# Patient Record
Sex: Female | Born: 1970 | Hispanic: Yes | Marital: Married | Smoking: Never smoker
Health system: Southern US, Community
[De-identification: ages and names within clinical notes are randomized; demographics above are authoritative.]

## PROBLEM LIST (undated history)

## (undated) DIAGNOSIS — E119 Type 2 diabetes mellitus without complications: Secondary | ICD-10-CM

## (undated) HISTORY — PX: PANCREAS SURGERY: SHX731

## (undated) HISTORY — PX: APPENDECTOMY: SHX54

## (undated) HISTORY — PX: CHOLECYSTECTOMY: SHX55

## (undated) HISTORY — DX: Type 2 diabetes mellitus without complications: E11.9

---

## 2013-02-11 ENCOUNTER — Encounter: Payer: Self-pay | Admitting: Endocrinology

## 2013-02-11 ENCOUNTER — Ambulatory Visit (INDEPENDENT_AMBULATORY_CARE_PROVIDER_SITE_OTHER): Payer: BC Managed Care – PPO | Admitting: Endocrinology

## 2013-02-11 VITALS — BP 130/80 | HR 92 | Temp 98.2°F | Resp 10 | Ht 59.0 in | Wt 135.2 lb

## 2013-02-11 DIAGNOSIS — E559 Vitamin D deficiency, unspecified: Secondary | ICD-10-CM

## 2013-02-11 DIAGNOSIS — C254 Malignant neoplasm of endocrine pancreas: Secondary | ICD-10-CM

## 2013-02-11 DIAGNOSIS — E781 Pure hyperglyceridemia: Secondary | ICD-10-CM

## 2013-02-11 LAB — URINALYSIS, ROUTINE W REFLEX MICROSCOPIC
Hgb urine dipstick: NEGATIVE
Leukocytes, UA: NEGATIVE
Nitrite: NEGATIVE
Specific Gravity, Urine: 1.025 (ref 1.000–1.030)
Urine Glucose: >=1000
Urobilinogen, UA: 0.2 (ref 0.0–1.0)
pH: 6 (ref 5.0–8.0)

## 2013-02-11 LAB — COMPREHENSIVE METABOLIC PANEL
ALT: 15 U/L (ref 0–35)
AST: 17 U/L (ref 0–37)
Albumin: 4.3 g/dL (ref 3.5–5.2)
BUN: 14 mg/dL (ref 6–23)
Creatinine, Ser: 0.5 mg/dL (ref 0.4–1.2)
Total Bilirubin: 0.6 mg/dL (ref 0.3–1.2)

## 2013-02-11 MED ORDER — ALOGLIPTIN-PIOGLITAZONE 25-30 MG PO TABS: 25.0000 mg | ORAL_TABLET | Freq: Every day | ORAL | Status: DC

## 2013-02-11 MED ORDER — METFORMIN HCL ER 500 MG PO TB24: 500.0000 mg | ORAL_TABLET | Freq: Every day | ORAL | Status: DC

## 2013-02-11 NOTE — Patient Instructions (Signed)
Use [employer] meter  Please check blood sugars at least half the time about 2 hours after any meal and as directed on waking up. Please bring blood sugar monitor to each visit

## 2013-02-11 NOTE — Progress Notes (Signed)
Quick Note:  Please let patient know that the potassium is low normal, to take Klor-Con 10 mEq daily for 15 days; glucose was 217 , urinalysis okay ______

## 2013-02-11 NOTE — Progress Notes (Signed)
Patient ID: Anna Jennings, female   DOB: 20-Jun-1970, 42 y.o.   MRN: [id number]   Reason for Appointment : Consultation for Type 2 Diabetes  History of Present Illness          Diagnosis: Type 2 diabetes mellitus, date of diagnosis: 2007       Past history: She was initially diagnosed with a two-hour postprandial glucose of 218 in 08/2005 and baseline A1c of 5.7 Apparently she was not treated with oral hypoglycemic drugs initially and probably started on metformin in 2009. She did have diarrhea with the regular metformin but tolerated extended release preparation better with no side effects even on 2000 mg She thinks her blood sugars are fairly well controlled initially but a couple of years later with higher readings she was given Amaryl also. The dose of this was gradually increased Her A1c readings have been ranging from 6.5-7.2 in between 2011 and 2013 but no further followup done after 11/2011  Recent history: She has not been followed up by her endocrinologist for over a year. Also she ran out off her medications 3 months ago and her blood sugars have been progressively higher. These are higher after meals and she is starting to get symptomatic with fatigue and increased thirst. Took some Amaryl since last week blood sugars are not much better. She has not exercised as much. Recently with high readings she found that her glucose was higher after exercise She is now establishing as a new patient for further management       Oral hypoglycemic drugs the patient is taking are: Amaryl 2 mg twice a day, previously metformin ER 2 g      Side effects from medications have been: Diarrhea from regular metformin   Glucose monitoring:  done one time a day         Glucometer: One Touch.      Blood Glucose readings from recall  PREMEAL Breakfast Lunch Dinner Bedtime  Overall   Glucose range: 260  180    Median:        POST-MEAL PC Breakfast PC Lunch PC Dinner  Glucose range: ?   280-300  Median:       Hypoglycemia:      Glycemic control:  No results found for this basename: HGBA1C   No results found for this basename: GLUF,  MICROALBUR,  LDLCALC,  CREATININE    Self-care: The diet that the patient has been following is: tries to limit portions, usually has some carbohydrate at every meal     Meals: 2 meals per day.  no breakfast, am snack, sandwich at lunch;  Some rice at meals         Exercise: Walking, less now, has treadmill. Previously also running           Dietician visit: Most recent:2011?                 Compliance with the medical regimen:  Retinal exam: Most recent: 02/07/13 Weight history: Losing weight, 10 lbs over the last 2 months   Filed Weights   02/11/13 1427  Weight: 135 lb 3.2 oz (61.326 kg)      Medication List       This list is accurate as of: 02/11/13 11:59 PM.  Always use your most recent med list.               Alogliptin-Pioglitazone 25-30 MG Tabs  Commonly known as:  OSENI  Take 25 mg by mouth daily.  glimepiride 2 MG tablet  Commonly known as:  AMARYL  Take 2 mg by mouth 2 (two) times daily.     metFORMIN 500 MG 24 hr tablet  Commonly known as:  GLUCOPHAGE-XR  Take 1 tablet (500 mg total) by mouth daily with breakfast. Takes 2 tablets twice a day     Vitamin D (Ergocalciferol) 50000 UNITS Caps capsule  Commonly known as:  DRISDOL  Take 50,000 Units by mouth every 7 (seven) days.        Allergies:  Allergies  Allergen Reactions  . Vicodin [Hydrocodone-Acetaminophen] Nausea Only    Past Medical History  Diagnosis Date  . Diabetes mellitus without complication     Past Surgical History  Procedure Laterality Date  . Cholecystectomy    . Appendectomy    . Pancreas surgery      Tumor head of pancreas, incompletely resected    Family History  Problem Relation Age of Onset  . Hyperlipidemia Father   . Hypertension Father   . Coronary artery disease Father   . Diabetes Brother     Social History:  reports that  she has never smoked. She has never used smokeless tobacco. Her alcohol and drug histories are not on file.    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 Was treated with radiation and subsequently her tumor has been stable and asymptomatic. Biopsy showed Adenocarcinoma, Islet Cell type. Her notes indicate tumor was 6.9 cm on ultrasound in 2012; periodically has had MRI done with no growth Report of last scan is not available      Lipids: In 2013 her LDL was 82 with HDL 43 and triglycerides 290. Has not been on any medications      No unusual headaches.  Previously has had migraines                Skin: No rash or infections     Thyroid:  No  unusual fatigue or history of thyroid disease.     The blood pressure has been previously high, was given Cozaar but could not tolerate it the cause of body aches. She has currently not on any medications     No swelling of feet.     No shortness of breath on exertion.     Bowel habits: Normal.      No joint  pains.         No history of Numbness, tingling or burning in  feet      She has had severe vitamin D deficiency diagnosed in 2009 with a level of 5.5, is on treatment   LABS:  Office Visit on 02/11/2013  Component Date Value Range Status  . Sodium 02/11/2013 136  135 - 145 mEq/L Final  . Potassium 02/11/2013 3.5  3.5 - 5.1 mEq/L Final  . Chloride 02/11/2013 101  96 - 112 mEq/L Final  . CO2 02/11/2013 27  19 - 32 mEq/L Final  . Glucose, Bld 02/11/2013 217* 70 - 99 mg/dL Final  . BUN 16/11/9602 14  6 - 23 mg/dL Final  . Creatinine, Ser 02/11/2013 0.5  0.4 - 1.2 mg/dL Final  . Total Bilirubin 02/11/2013 0.6  0.3 - 1.2 mg/dL Final  . Alkaline Phosphatase 02/11/2013 82  39 - 117 U/L Final  . AST 02/11/2013 17  0 - 37 U/L Final  . ALT  02/11/2013 15  0 - 35 U/L Final  . Total Protein 02/11/2013 7.3  6.0 - 8.3  g/dL Final  . Albumin 16/11/9602 4.3  3.5 - 5.2 g/dL Final  . Calcium 54/10/8117 8.8  8.4 - 10.5 mg/dL Final  . GFR 14/78/2956 141.21  >60.00 mL/min Final  . Color, Urine 02/11/2013 Yellow  Yellow;Lt. Yellow Final  . APPearance 02/11/2013 CLEAR  Clear Final  . Specific Gravity, Urine 02/11/2013 1.025  1.000-1.030 Final  . pH 02/11/2013 6.0  5.0 - 8.0 Final  . Total Protein, Urine 02/11/2013 NEGATIVE  Negative Final  . Urine Glucose 02/11/2013 >=1000  Negative Final  . Ketones, ur 02/11/2013 TRACE  Negative Final  . Bilirubin Urine 02/11/2013 NEGATIVE  Negative Final  . Hgb urine dipstick 02/11/2013 NEGATIVE  Negative Final  . Urobilinogen, UA 02/11/2013 0.2  0.0 - 1.0 Final  . Leukocytes, UA 02/11/2013 NEGATIVE  Negative Final  . Nitrite 02/11/2013 NEGATIVE  Negative Final  . WBC, UA 02/11/2013 3-6/hpf  0-2/hpf Final  . RBC / HPF 02/11/2013 0-2/hpf  0-2/hpf Final  . Squamous Epithelial / LPF 02/11/2013 Rare(0-4/hpf)  Rare(0-4/hpf) Final  . Bacteria, UA 02/11/2013 Rare(<10/hpf)  None Final    Physical Examination:  BP 130/80  Pulse 92  Temp(Src) 98.2 F (36.8 C)  Resp 10  Ht 4\' 11"  (1.499 m)  Wt 135 lb 3.2 oz (61.326 kg)  BMI 27.29 kg/m2  SpO2 97%  LMP 01/31/2013  GENERAL:         Patient has minimal obesity.   HEENT:         Eye exam shows normal external appearance. Fundus exam deferred, has had recent eye exam.  Oral exam shows normal mucosa .  NECK:         General:  Neck exam shows no lymphadenopathy. Carotids are normal to palpation and no bruit heard.  Thyroid is not enlarged and no nodules felt.   LUNGS:         Chest is symmetrical. Lungs are clear to auscultation.Marland Kitchen   HEART:         Heart sounds:  S1 and S2 are normal. No murmurs or clicks heard., no S3 or S4.   ABDOMEN:  large upper abdominal scar present. There is no distention present. Liver and spleen are not palpable. No other mass or tenderness present.  EXTREMITIES:     There is no edema. No skin lesions  present.Marland Kitchen  NEUROLOGICAL:        Vibration sense is nearly normal in toes. Ankle and biceps reflexes are absent bilaterally.          Diabetic foot exam:  as in the foot exam section MUSCULOSKELETAL:       There is no enlargement or deformity of the joints. SKIN:       No rash or lesions of concern.        ASSESSMENT:  Diabetes type 2, uncontrolled     She has had long-standing diabetes which has been previously mild. 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  History of hypertriglyceridemia: This will need to be followed up once her blood sugars are well controlled. May benefit from pioglitazone  Vitamin D deficiency: Previously severe, has been taking up to thousand units weekly, no recent vitamin D levels available on her record  PLAN:   For simplicity will start her Oseni tablets 25/30 which would help with insulin resistance as well as GLP-1 action and beta cell function. She will restart her metformin ER which had helped her previously and she had tolerated 2000 mg a day Advised her not to do any vigorous exercise until her blood sugars are well controlled but may start walking on her treadmill She will be scheduled to see a nutritionist for meal planning Discussed adding protein to each meal on a regular basis  She will need to be seen by gastroenterologist for periodic followup of her long-standing islet cell adenocarcinoma  Vitamin D deficiency: She will continue her  supplementation and have her level checked periodically   Bayan Hedstrom 02/12/2013, 3:06 PM

## 2013-02-12 ENCOUNTER — Other Ambulatory Visit: Payer: Self-pay | Admitting: *Deleted

## 2013-02-12 ENCOUNTER — Encounter: Payer: Self-pay | Admitting: Endocrinology

## 2013-02-12 DIAGNOSIS — E781 Pure hyperglyceridemia: Secondary | ICD-10-CM | POA: Insufficient documentation

## 2013-02-12 DIAGNOSIS — C254 Malignant neoplasm of endocrine pancreas: Secondary | ICD-10-CM | POA: Insufficient documentation

## 2013-02-12 DIAGNOSIS — E559 Vitamin D deficiency, unspecified: Secondary | ICD-10-CM | POA: Insufficient documentation

## 2013-02-12 MED ORDER — POTASSIUM CHLORIDE ER 10 MEQ PO TBCR: EXTENDED_RELEASE_TABLET | ORAL | Status: DC

## 2013-03-11 ENCOUNTER — Ambulatory Visit (INDEPENDENT_AMBULATORY_CARE_PROVIDER_SITE_OTHER): Payer: BC Managed Care – PPO | Admitting: Endocrinology

## 2013-03-11 ENCOUNTER — Other Ambulatory Visit (INDEPENDENT_AMBULATORY_CARE_PROVIDER_SITE_OTHER): Payer: BC Managed Care – PPO

## 2013-03-11 ENCOUNTER — Other Ambulatory Visit: Payer: BC Managed Care – PPO

## 2013-03-11 ENCOUNTER — Encounter: Payer: Self-pay | Admitting: Endocrinology

## 2013-03-11 VITALS — BP 118/80 | HR 101 | Temp 98.2°F | Resp 12 | Ht 59.0 in | Wt 140.9 lb

## 2013-03-11 DIAGNOSIS — E1165 Type 2 diabetes mellitus with hyperglycemia: Principal | ICD-10-CM

## 2013-03-11 DIAGNOSIS — IMO0001 Reserved for inherently not codable concepts without codable children: Secondary | ICD-10-CM

## 2013-03-11 DIAGNOSIS — E781 Pure hyperglyceridemia: Secondary | ICD-10-CM

## 2013-03-11 DIAGNOSIS — E559 Vitamin D deficiency, unspecified: Secondary | ICD-10-CM

## 2013-03-11 LAB — BASIC METABOLIC PANEL
BUN: 12 mg/dL (ref 6–23)
CHLORIDE: 102 meq/L (ref 96–112)
CO2: 25 mEq/L (ref 19–32)
Calcium: 8.5 mg/dL (ref 8.4–10.5)
Creatinine, Ser: 0.6 mg/dL (ref 0.4–1.2)
GFR: 124.15 mL/min (ref >60.00)
Glucose, Bld: 283 mg/dL — ABNORMAL HIGH (ref 70–99)
POTASSIUM: 3.9 meq/L (ref 3.5–5.1)
Sodium: 136 mEq/L (ref 135–145)

## 2013-03-11 NOTE — Patient Instructions (Addendum)
Please check blood sugars at least half the time about 2 hours after any meal (dinner especially) and as directed on waking up. Please bring blood sugar monitor to each visit  Amaryl before supper if sugars > 150; call back this week  VITAMIN D3, 5000 UNITS DAILY

## 2013-03-11 NOTE — Progress Notes (Signed)
Patient ID: Anna Jennings, female   DOB: 04/25/1970, 43 y.o.   MRN: [id number]   Reason for Appointment : Followup for Type 2 Diabetes  History of Present Illness          Diagnosis: Type 2 diabetes mellitus, date of diagnosis: 2007       Past history: She was initially diagnosed with a two-hour postprandial glucose of 218 in 08/2005 and baseline A1c of 5.7 Apparently she was not treated with oral hypoglycemic drugs initially and probably started on metformin in 2009. She did have diarrhea with the regular metformin but tolerated extended release preparation better with no side effects even on 2000 mg She thinks her blood sugars are fairly well controlled initially but a couple of years later with higher readings she was given Amaryl also. The dose of this was gradually increased Her A1c readings have been ranging from 6.5-7.2 in between 2011 and 2013 but no further followup done after 11/2011  Recent history: She had been out of her medications prior to her initial visit here in 12/14 and her blood sugars had gone progressively higher. His were not better with trying Amaryl for a few days She was empirically given a trial of metformin ER with increasing doses and also started  Oseni However she has not checked her blood sugars much especially in the last 12 days and not clear if they are better. The blood sugars were still over 200 last month in the mornings and today with not eating much blood sugar is down to 128 She thinks she feels a little better overall       Oral hypoglycemic drugs the patient is taking are: Oseni 15/30,  metformin ER 2 g      Side effects from medications have been: Diarrhea from regular metformin   Glucose monitoring:  done one time a day         Glucometer: One Touch.      Blood Glucose readings from download:  PREMEAL Breakfast Lunch Dinner   Overall   Glucose range:  212=269 161-188  128,199    Median:     205   POST-MEAL PC Breakfast PC Lunch PC Dinner   Glucose range: 213  316  Median:      Hypoglycemia: none     Glycemic control:  No results found for this basename: HGBA1C   No results found for this basename: GLUF,  MICROALBUR,  LDLCALC,  CREATININE    Self-care: The diet that the patient has been following is: tries to limit portions, usually has some carbohydrate at every meal     Meals: 2 meals per day.  no breakfast, am snack, sandwich at lunch;  Some rice at meals         Exercise: Walking 26min, 3/7; has treadmill. Previously also running           Dietician visit: Most recent:2011?                 Compliance with the medical regimen:  Retinal exam: Most recent: 02/07/13 Weight history: Losing weight, 10 lbs over the last 2 months   Filed Weights   03/11/13 1502  Weight: 140 lb 14.4 oz (63.912 kg)      Medication List       This list is accurate as of: 03/11/13  3:20 PM.  Always use your most recent med list.               Alogliptin-Pioglitazone 25-30 MG Tabs  Commonly known as:  OSENI  Take 25 mg by mouth daily.     metFORMIN 500 MG 24 hr tablet  Commonly known as:  GLUCOPHAGE-XR  Take 500 mg by mouth 2 (two) times daily. Takes 2 tablets twice a day     potassium chloride 10 MEQ tablet  Commonly known as:  K-DUR  Take 1 tablet daily for 15 days     Vitamin D (Ergocalciferol) 50000 UNITS Caps capsule  Commonly known as:  DRISDOL  Take 50,000 Units by mouth every 7 (seven) days.        Allergies:  Allergies  Allergen Reactions  . Vicodin [Hydrocodone-Acetaminophen] Nausea Only    Past Medical History  Diagnosis Date  . Diabetes mellitus without complication     Past Surgical History  Procedure Laterality Date  . Cholecystectomy    . Appendectomy    . Pancreas surgery      Tumor head of pancreas, incompletely resected    Family History  Problem Relation Age of Onset  . Hyperlipidemia Father   . Hypertension Father   . Coronary artery disease Father   . Diabetes Brother      Social History:  reports that she has never smoked. She has never used smokeless tobacco. Her alcohol and drug histories are not on file.    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 Was treated with radiation and subsequently her tumor has been stable and asymptomatic. Biopsy showed Adenocarcinoma, Islet Cell type. Her notes indicate tumor was 6.9 cm on ultrasound in 2012; periodically has had MRI done with no growth Report of last scan is not available      Lipids: In 2013 her LDL was 82 with HDL 43 and triglycerides 290. Has not been on any medications     No swelling of feet, was having swelling in [place] recently    She has had severe vitamin D deficiency diagnosed in 2009 with a level of 5.5, has not restarted supplements recently   LABS:  No visits with results within 1 Week(s) from this visit. Latest known visit with results is:  Office Visit on 02/11/2013  Component Date Value Range Status  . Sodium 02/11/2013 136  135 - 145 mEq/L Final  . Potassium 02/11/2013 3.5  3.5 - 5.1 mEq/L Final  . Chloride 02/11/2013 101  96 - 112 mEq/L Final  . CO2 02/11/2013 27  19 - 32 mEq/L Final  . Glucose, Bld 02/11/2013 217* 70 - 99 mg/dL Final  . BUN 02/11/2013 14  6 - 23 mg/dL Final  . Creatinine, Ser 02/11/2013 0.5  0.4 - 1.2 mg/dL Final  . Total Bilirubin 02/11/2013 0.6  0.3 - 1.2 mg/dL Final  . Alkaline Phosphatase 02/11/2013 82  39 - 117 U/L Final  . AST 02/11/2013 17  0 - 37 U/L Final  . ALT 02/11/2013 15  0 - 35 U/L Final  . Total Protein 02/11/2013 7.3  6.0 - 8.3 g/dL Final  . Albumin 02/11/2013 4.3  3.5 - 5.2 g/dL Final  . Calcium 02/11/2013 8.8  8.4 - 10.5 mg/dL Final  . GFR 02/11/2013 141.21  >60.00 mL/min Final  . Color, Urine 02/11/2013 Yellow  Yellow;Lt. Yellow Final  . APPearance 02/11/2013 CLEAR  Clear Final  . Specific Gravity, Urine  02/11/2013 1.025  1.000-1.030 Final  . pH 02/11/2013 6.0  5.0 -  8.0 Final  . Total Protein, Urine 02/11/2013 NEGATIVE  Negative Final  . Urine Glucose 02/11/2013 >=1000  Negative Final  . Ketones, ur 02/11/2013 TRACE  Negative Final  . Bilirubin Urine 02/11/2013 NEGATIVE  Negative Final  . Hgb urine dipstick 02/11/2013 NEGATIVE  Negative Final  . Urobilinogen, UA 02/11/2013 0.2  0.0 - 1.0 Final  . Leukocytes, UA 02/11/2013 NEGATIVE  Negative Final  . Nitrite 02/11/2013 NEGATIVE  Negative Final  . WBC, UA 02/11/2013 3-6/hpf  0-2/hpf Final  . RBC / HPF 02/11/2013 0-2/hpf  0-2/hpf Final  . Squamous Epithelial / LPF 02/11/2013 Rare(0-4/hpf)  Rare(0-4/hpf) Final  . Bacteria, UA 02/11/2013 Rare(<10/hpf)  None Final    Physical Examination:  BP 118/80  Pulse 101  Temp(Src) 98.2 F (36.8 C)  Resp 12  Ht 4\' 11"  (1.499 m)  Wt 140 lb 14.4 oz (63.912 kg)  BMI 28.44 kg/m2  SpO2 99%  LMP 01/31/2013      ASSESSMENT:  Diabetes type 2, uncontrolled     She has had long-standing diabetes which has been previously mild. Most likely she has had some progression of her beta cell dysfunction and will need a multidrug regimen to help with insulin resistance and beta cell function Although her blood sugar is fairly good today it is not clear if they are improving with regimen of metformin ER and Oseni which she started last month  Vitamin D deficiency: Previously severe, has been taking up to thousand units weekly, no recent vitamin D levels available on her record  PLAN:   She will check her blood sugars in the mornings regularly and call readings at the end of the week Most likely will need to take 2-4 mg of Amaryl in the evening either before or after supper based on when her blood sugars are going up Also will need to consider starting insulin if blood sugars do not respond  She will need to be seen by gastroenterologist for periodic followup of her long-standing islet cell  adenocarcinoma  Vitamin D deficiency: She will resume her supplementation, she prefers OTC and will let her start on 5000 units a day   Batina Dougan 03/11/2013, 3:20 PM   Appointment on 03/11/2013  Component Date Value Range Status  . Sodium 03/11/2013 136  135 - 145 mEq/L Final  . Potassium 03/11/2013 3.9  3.5 - 5.1 mEq/L Final  . Chloride 03/11/2013 102  96 - 112 mEq/L Final  . CO2 03/11/2013 25  19 - 32 mEq/L Final  . Glucose, Bld 03/11/2013 283* 70 - 99 mg/dL Final  . BUN 03/11/2013 12  6 - 23 mg/dL Final  . Creatinine, Ser 03/11/2013 0.6  0.4 - 1.2 mg/dL Final  . Calcium 03/11/2013 8.5  8.4 - 10.5 mg/dL Final  . GFR 03/11/2013 124.15  >60.00 mL/min Final  . Vit D, 25-Hydroxy 03/11/2013 27* 30 - 89 ng/mL Final   Comment: This assay accurately quantifies Vitamin D, which is the sum of the                          25-Hydroxy forms of Vitamin D2 and D3.  Studies have shown that the                          optimum concentration of 25-Hydroxy Vitamin D is 30 ng/mL or higher.  Concentrations of Vitamin D between 20 and 29 ng/mL are considered to                          be insufficient and concentrations less than 20 ng/mL are considered                          to be deficient for Vitamin D.

## 2013-03-12 LAB — VITAMIN D 25 HYDROXY (VIT D DEFICIENCY, FRACTURES): Vit D, 25-Hydroxy: 27 ng/mL — ABNORMAL LOW (ref 30–89)

## 2013-04-08 ENCOUNTER — Other Ambulatory Visit: Payer: BC Managed Care – PPO

## 2013-04-15 ENCOUNTER — Ambulatory Visit: Payer: BC Managed Care – PPO | Admitting: Endocrinology

## 2013-04-15 ENCOUNTER — Other Ambulatory Visit: Payer: BC Managed Care – PPO

## 2013-04-29 ENCOUNTER — Other Ambulatory Visit (INDEPENDENT_AMBULATORY_CARE_PROVIDER_SITE_OTHER): Payer: Self-pay

## 2013-04-29 DIAGNOSIS — IMO0001 Reserved for inherently not codable concepts without codable children: Secondary | ICD-10-CM

## 2013-04-29 DIAGNOSIS — E1165 Type 2 diabetes mellitus with hyperglycemia: Principal | ICD-10-CM

## 2013-04-29 LAB — COMPREHENSIVE METABOLIC PANEL
ALT: 20 U/L (ref 0–35)
AST: 15 U/L (ref 0–37)
Albumin: 4 g/dL (ref 3.5–5.2)
Alkaline Phosphatase: 67 U/L (ref 39–117)
BILIRUBIN TOTAL: 0.5 mg/dL (ref 0.3–1.2)
BUN: 13 mg/dL (ref 6–23)
CALCIUM: 8.8 mg/dL (ref 8.4–10.5)
CHLORIDE: 98 meq/L (ref 96–112)
CO2: 25 mEq/L (ref 19–32)
CREATININE: 0.7 mg/dL (ref 0.4–1.2)
GFR: 106.62 mL/min (ref >60.00)
GLUCOSE: 258 mg/dL — AB (ref 70–99)
Potassium: 4.1 mEq/L (ref 3.5–5.1)
SODIUM: 132 meq/L — AB (ref 135–145)
TOTAL PROTEIN: 7.4 g/dL (ref 6.0–8.3)

## 2013-04-29 LAB — LIPID PANEL
CHOL/HDL RATIO: 4
Cholesterol: 200 mg/dL (ref 0–200)
HDL: 51.1 mg/dL (ref >39.00)
LDL Cholesterol: 124 mg/dL — ABNORMAL HIGH (ref 0–99)
TRIGLYCERIDES: 124 mg/dL (ref 0.0–149.0)
VLDL: 24.8 mg/dL (ref 0.0–40.0)

## 2013-04-29 LAB — HEMOGLOBIN A1C: HEMOGLOBIN A1C: 8.5 % — AB (ref 4.6–6.5)

## 2013-05-06 ENCOUNTER — Encounter: Payer: Self-pay | Admitting: Endocrinology

## 2013-05-06 ENCOUNTER — Ambulatory Visit (INDEPENDENT_AMBULATORY_CARE_PROVIDER_SITE_OTHER): Payer: BC Managed Care – PPO | Admitting: Endocrinology

## 2013-05-06 VITALS — BP 116/78 | HR 86 | Temp 97.7°F | Resp 14 | Ht 59.0 in | Wt 140.4 lb

## 2013-05-06 DIAGNOSIS — E1165 Type 2 diabetes mellitus with hyperglycemia: Principal | ICD-10-CM

## 2013-05-06 DIAGNOSIS — IMO0001 Reserved for inherently not codable concepts without codable children: Secondary | ICD-10-CM

## 2013-05-06 DIAGNOSIS — E782 Mixed hyperlipidemia: Secondary | ICD-10-CM

## 2013-05-06 NOTE — Progress Notes (Signed)
Patient ID: Anna Jennings, female   DOB: 08-Oct-1970, 43 y.o.   MRN: [id number]   Reason for Appointment : Followup for Type 2 Diabetes  History of Present Illness          Diagnosis: Type 2 diabetes mellitus, date of diagnosis: 2007       Past history: She was initially diagnosed with a two-hour postprandial glucose of 218 in 08/2005 and baseline A1c of 5.7 Apparently she was not treated with oral hypoglycemic drugs initially and probably started on metformin in 2009. She did have diarrhea with the regular metformin but tolerated extended release preparation better with no side effects even on 2000 mg She thinks her blood sugars are fairly well controlled initially but a couple of years later with higher readings she was given Amaryl also. The dose of this was gradually increased Her A1c readings have been ranging from 6.5-7.2 in between 2011 and 2013 but no further followup done after 11/2011 She had been out of her medications prior to her initial visit here in 12/14 and her blood sugars were progressively higher. This was despite trying Amaryl on her own  Recent history:  She is on a trial of metformin ER with  Oseni Blood sugars apparently did not improve with this Also she thinks she is getting more bloated and getting some weight gain with her Oseni However she has not checked her blood sugars much especially in the last 2 weeks or so  Also has not been consistent with self care because of family issues       Oral hypoglycemic drugs the patient is taking are: Oseni 15/30,  metformin ER 2 g      Side effects from medications have been: Diarrhea from regular metformin   Glucose monitoring:  done one time a day         Glucometer: One Touch.      Blood Glucose readings not checked for the last 2 weeks, previously in am 192, 214 Hypoglycemia: none     Glycemic control:  Lab Results  Component Value Date   HGBA1C 8.5* 04/29/2013   No results found for this basename: GLUF,  MICROALBUR,   LDLCALC,  CREATININE    Self-care: The diet that the patient has been following is: tries to limit portions, usually has some carbohydrate at every meal     Meals: 2 meals per day.  no breakfast, am snack, sandwich at lunch;  Some rice at meals         Exercise: Walking 82min, 3/7; has treadmill. Previously also running           Dietician visit: Most recent:2011?                 Compliance with the medical regimen:  Retinal exam: Most recent: 02/07/13 Weight history:  Wt Readings from Last 3 Encounters:  05/06/13 140 lb 6.4 oz (63.685 kg)  03/11/13 140 lb 14.4 oz (63.912 kg)  02/11/13 135 lb 3.2 oz (61.326 kg)       Medication List       This list is accurate as of: 05/06/13 11:59 PM.  Always use your most recent med list.               Alogliptin-Pioglitazone 25-30 MG Tabs  Commonly known as:  OSENI  Take 25 mg by mouth daily.     metFORMIN 500 MG 24 hr tablet  Commonly known as:  GLUCOPHAGE-XR  Take 500 mg by mouth 2 (two)  times daily. Takes 2 tablets twice a day     potassium chloride 10 MEQ tablet  Commonly known as:  K-DUR  Take 1 tablet daily for 15 days     Vitamin D (Ergocalciferol) 50000 UNITS Caps capsule  Commonly known as:  DRISDOL  Take 50,000 Units by mouth every 7 (seven) days.        Allergies:  Allergies  Allergen Reactions  . Vicodin [Hydrocodone-Acetaminophen] Nausea Only    Past Medical History  Diagnosis Date  . Diabetes mellitus without complication     Past Surgical History  Procedure Laterality Date  . Cholecystectomy    . Appendectomy    . Pancreas surgery      Tumor head of pancreas, incompletely resected    Family History  Problem Relation Age of Onset  . Hyperlipidemia Father   . Hypertension Father   . Coronary artery disease Father   . Diabetes Brother     Social History:  reports that she has never smoked. She has never used smokeless tobacco. Her alcohol and drug histories are not on file.    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 Was treated with radiation and subsequently her tumor has been stable and asymptomatic. Biopsy showed Adenocarcinoma, Islet Cell type. Her notes indicate tumor was 6.9 cm on ultrasound in 2012; periodically has had MRI done with no growth Report of last scan is not available      Lipids: In 2013 her LDL was 82 with HDL 43 and triglycerides 290. Has not been on any medications    She has had severe vitamin D deficiency diagnosed in 2009 with a level of 5.5, has not restarted supplements recently  No history of hypertension   LABS:  No visits with results within 1 Week(s) from this visit. Latest known visit with results is:  Appointment on 04/29/2013  Component Date Value Ref Range Status  . Hemoglobin A1C 04/29/2013 8.5* 4.6 - 6.5 % Final   Glycemic Control Guidelines for People with Diabetes:Non Diabetic:  <6%Goal of Therapy: <7%Additional Action Suggested:  >8%   . Cholesterol 04/29/2013 200  0 - 200 mg/dL Final   ATP III Classification       Desirable:  < 200 mg/dL               Borderline High:  200 - 239 mg/dL          High:  > = 240 mg/dL  . Triglycerides 04/29/2013 124.0  0.0 - 149.0 mg/dL Final   Normal:  <150 mg/dLBorderline High:  150 - 199 mg/dL  . HDL 04/29/2013 51.10  >39.00 mg/dL Final  . VLDL 04/29/2013 24.8  0.0 - 40.0 mg/dL Final  . LDL Cholesterol 04/29/2013 124* 0 - 99 mg/dL Final  . Total CHOL/HDL Ratio 04/29/2013 4   Final                  Men          Women1/2 Average Risk     3.4          3.3Average Risk          5.0          4.42X Average Risk          9.6          7.13X Average Risk  15.0          11.0                      . Sodium 04/29/2013 132* 135 - 145 mEq/L Final  . Potassium 04/29/2013 4.1  3.5 - 5.1 mEq/L Final  . Chloride 04/29/2013 98  96 - 112 mEq/L Final  . CO2 04/29/2013 25  19 - 32  mEq/L Final  . Glucose, Bld 04/29/2013 258* 70 - 99 mg/dL Final  . BUN 04/29/2013 13  6 - 23 mg/dL Final  . Creatinine, Ser 04/29/2013 0.7  0.4 - 1.2 mg/dL Final  . Total Bilirubin 04/29/2013 0.5  0.3 - 1.2 mg/dL Final  . Alkaline Phosphatase 04/29/2013 67  39 - 117 U/L Final  . AST 04/29/2013 15  0 - 37 U/L Final  . ALT 04/29/2013 20  0 - 35 U/L Final  . Total Protein 04/29/2013 7.4  6.0 - 8.3 g/dL Final  . Albumin 04/29/2013 4.0  3.5 - 5.2 g/dL Final  . Calcium 04/29/2013 8.8  8.4 - 10.5 mg/dL Final  . GFR 04/29/2013 106.62  >60.00 mL/min Final    Physical Examination:  BP 116/78  Pulse 86  Temp(Src) 97.7 F (36.5 C)  Resp 14  Ht 4\' 11"  (1.499 m)  Wt 140 lb 6.4 oz (63.685 kg)  BMI 28.34 kg/m2  SpO2 99%  LMP 04/14/2013  Minimal puffiness of hands and face, no ankle edema    ASSESSMENT:  Diabetes type 2, uncontrolled     She has had long-standing diabetes which has been previously mild. Her blood sugars are significantly high and not improving with a 3 drug regimen Also appears to be getting some weight gain and mild generalized fluid retention with pioglitazone  Most likely has insulin deficiency and will need to add insulin; most of her high readings appear to be in the morning and will benefit from basal insulin to start with  Hypercholesterolemia: Lipids to be checked today  PLAN:   She will be instructed diagnosis educator on starting Levemir insulin 10 units a day and evenings and this will be titrated to get her morning sugar down to at least under 130 Discussed basic principles of insulin use and basal insulin Meanwhile she will switch from [place] to Glyxambi 10/5 mg daily in the morning with a 30 day sample  With this she will hopefully have better control and some weight loss as well as better postprandial control Discussed how SGLT2 drugs work and benefits as well as possible side effects   Jacobb Alen 05/08/2013, 4:00 PM   Addendum:She does have a high  LDL now and will discuss treatment on the next visit  No visits with results within 1 Week(s) from this visit. Latest known visit with results is:  Appointment on 04/29/2013  Component Date Value Ref Range Status  . Hemoglobin A1C 04/29/2013 8.5* 4.6 - 6.5 % Final   Glycemic Control Guidelines for People with Diabetes:Non Diabetic:  <6%Goal of Therapy: <7%Additional Action Suggested:  >8%   . Cholesterol 04/29/2013 200  0 - 200 mg/dL Final   ATP III Classification       Desirable:  < 200 mg/dL               Borderline High:  200 - 239 mg/dL          High:  > = 240 mg/dL  . Triglycerides 04/29/2013 124.0  0.0 - 149.0 mg/dL Final   Normal:  <  150 mg/dLBorderline High:  150 - 199 mg/dL  . HDL 04/29/2013 51.10  >39.00 mg/dL Final  . VLDL 04/29/2013 24.8  0.0 - 40.0 mg/dL Final  . LDL Cholesterol 04/29/2013 124* 0 - 99 mg/dL Final  . Total CHOL/HDL Ratio 04/29/2013 4   Final                  Men          Women1/2 Average Risk     3.4          3.3Average Risk          5.0          4.42X Average Risk          9.6          7.13X Average Risk          15.0          11.0                      . Sodium 04/29/2013 132* 135 - 145 mEq/L Final  . Potassium 04/29/2013 4.1  3.5 - 5.1 mEq/L Final  . Chloride 04/29/2013 98  96 - 112 mEq/L Final  . CO2 04/29/2013 25  19 - 32 mEq/L Final  . Glucose, Bld 04/29/2013 258* 70 - 99 mg/dL Final  . BUN 04/29/2013 13  6 - 23 mg/dL Final  . Creatinine, Ser 04/29/2013 0.7  0.4 - 1.2 mg/dL Final  . Total Bilirubin 04/29/2013 0.5  0.3 - 1.2 mg/dL Final  . Alkaline Phosphatase 04/29/2013 67  39 - 117 U/L Final  . AST 04/29/2013 15  0 - 37 U/L Final  . ALT 04/29/2013 20  0 - 35 U/L Final  . Total Protein 04/29/2013 7.4  6.0 - 8.3 g/dL Final  . Albumin 04/29/2013 4.0  3.5 - 5.2 g/dL Final  . Calcium 04/29/2013 8.8  8.4 - 10.5 mg/dL Final  . GFR 04/29/2013 106.62  >60.00 mL/min Final

## 2013-05-06 NOTE — Patient Instructions (Signed)
Will start Levemir 10 units at dinner after instructions  Please check blood sugars at least half the time about 2 hours after any meal and as directed on waking up. Please bring blood sugar monitor to each visit ] Stop Oseni and start Glyxambi

## 2013-05-08 DIAGNOSIS — E782 Mixed hyperlipidemia: Secondary | ICD-10-CM | POA: Insufficient documentation

## 2013-05-13 ENCOUNTER — Encounter: Payer: BC Managed Care – PPO | Attending: Endocrinology | Admitting: Nutrition

## 2013-05-13 DIAGNOSIS — E1165 Type 2 diabetes mellitus with hyperglycemia: Secondary | ICD-10-CM

## 2013-05-13 DIAGNOSIS — E119 Type 2 diabetes mellitus without complications: Secondary | ICD-10-CM | POA: Insufficient documentation

## 2013-05-13 DIAGNOSIS — Z713 Dietary counseling and surveillance: Secondary | ICD-10-CM | POA: Insufficient documentation

## 2013-05-13 DIAGNOSIS — IMO0001 Reserved for inherently not codable concepts without codable children: Secondary | ICD-10-CM

## 2013-05-13 NOTE — Progress Notes (Signed)
Patient was instructed on the use of the Levemir FlexTouch pen.  Written instructions were given for 10u at bedtime.  We discussed how to use the pen and she redemonstrated the procedure well and had no questions.   We also discussed how to adjust the Levemir dose, and written instructions were given for this as well.  She reported good understanding of this. We reviewed low blood sugars--symptoms and treatment.  She was given a BD pen starter kit and and a Levemir starter kit.   Her diet is not balanced and high in carbs for breakfast.  She is eating cereal, milk and 2-3 fruit servings.  Suggestions were given for a 30-45 gram carb breakfast with one ounce of protein.   We discussed her diabetes and the need for exercise, balanced meals and insulin.  She reported good understanding of this. She is walking on the treadmill at home for 20 min. Each day.  She was encouraged to increase this to 30 min. By doing a 10 min. Walk at lunch time.  She agreed to do this.   She had no final questions.

## 2013-05-14 NOTE — Patient Instructions (Signed)
Take 10u of Levemir at bedtime Increase the dose  By 2units every 3 days until FBSs are less than 120.  Test blood sugars before breakfast and 2hr. After one meal each day. Limit breakfast to no more than 3 servings of carbs, and one ounce of protein. Call if questions.

## 2013-05-27 ENCOUNTER — Ambulatory Visit: Payer: BC Managed Care – PPO | Admitting: Endocrinology

## 2013-06-03 ENCOUNTER — Ambulatory Visit (INDEPENDENT_AMBULATORY_CARE_PROVIDER_SITE_OTHER): Payer: BC Managed Care – PPO | Admitting: Endocrinology

## 2013-06-03 ENCOUNTER — Ambulatory Visit: Payer: BC Managed Care – PPO | Admitting: Endocrinology

## 2013-06-03 ENCOUNTER — Encounter: Payer: Self-pay | Admitting: Endocrinology

## 2013-06-03 VITALS — BP 108/72 | HR 95 | Temp 97.8°F | Resp 14 | Ht 59.0 in | Wt 135.4 lb

## 2013-06-03 DIAGNOSIS — E782 Mixed hyperlipidemia: Secondary | ICD-10-CM

## 2013-06-03 DIAGNOSIS — E1165 Type 2 diabetes mellitus with hyperglycemia: Principal | ICD-10-CM

## 2013-06-03 DIAGNOSIS — IMO0001 Reserved for inherently not codable concepts without codable children: Secondary | ICD-10-CM

## 2013-06-03 MED ORDER — EMPAGLIFLOZIN-LINAGLIPTIN 10-5 MG PO TABS: 10.0000 mg | ORAL_TABLET | Freq: Every day | ORAL | Status: DC

## 2013-06-03 NOTE — Progress Notes (Signed)
Patient ID: Anna Jennings, female   DOB: [birth year], 43 y.o.   MRN: [id number]   Reason for Appointment : Followup for Type 2 Diabetes  History of Present Illness          Diagnosis: Type 2 diabetes mellitus, date of diagnosis: 2007       Past history: She was initially diagnosed with a two-hour postprandial glucose of 218 in 08/2005 and baseline A1c of 5.7 Apparently she was not treated with oral hypoglycemic drugs initially and probably started on metformin in 2009. She did have diarrhea with the regular metformin but tolerated extended release preparation better with no side effects even on 2000 mg She thinks her blood sugars are fairly well controlled initially but a couple of years later with higher readings she was given Amaryl also. The dose of this was gradually increased Her A1c readings have been ranging from 6.5-7.2 in between 2011 and 2013 but no further followup done after 11/2011 She had been out of her medications prior to her initial visit here in 12/14 and her blood sugars were progressively higher. This was despite trying Amaryl on her own  Recent history:  She was started on Levemir insulin on 05/13/13. Initially was given 8 units She was instructed on titration and has gone up to 14 units about 3 days ago She was also having difficulties with bloating and swelling with pioglitazone and her [name] was stopped She has been taking a sample of Glyxambi 10/5 since her last visit The blood sugars are significantly better compared to readings of around 200 previously; has been monitoring much more regularly also She is also on maximum dose metformin ER 2000mg . No side effects from extended release metformin       Oral hypoglycemic drugs the patient is taking are: Glyxambi,  metformin ER 2 g      Side effects from medications have been: Diarrhea from regular metformin   Glucose monitoring:  2.5 times a day       Glucometer: One Touch.       PREMEAL Breakfast Lunch 7-9 pm Bedtime  Overall  Glucose range:  101-145   81-131   98-198    75-198   Mean/median:  126    138    122    Hypoglycemia: none     Glycemic control:  Lab Results  Component Value Date   HGBA1C 8.5* 04/29/2013   No results found for this basename: GLUF,  MICROALBUR,  LDLCALC,  CREATININE    Self-care: The diet that the patient has been following is: tries to limit portions, usually has some carbohydrate at every meal     Meals: 2 meals per day.  no breakfast, am snack, sandwich at lunch;  Some rice at meals         Exercise: Walking 42min, 5/7; has treadmill.            Dietician visit: Most recent:2011?                 Compliance with the medical regimen:  Retinal exam: Most recent: 02/07/13 Weight history:  Wt Readings from Last 3 Encounters:  06/03/13 135 lb 6.4 oz (61.417 kg)  05/06/13 140 lb 6.4 oz (63.685 kg)  03/11/13 140 lb 14.4 oz (63.912 kg)       Medication List       This list is accurate as of: 06/03/13  4:10 PM.  Always use your most recent med list.  Alogliptin-Pioglitazone 25-30 MG Tabs  Commonly known as:  OSENI  Take 25 mg by mouth daily.     GLYXAMBI 10-5 MG Tabs  Generic drug:  Empagliflozin-Linagliptin  Take by mouth.     LEVEMIR FLEXTOUCH Notus  Inject 14 Units into the skin.     metFORMIN 500 MG 24 hr tablet  Commonly known as:  GLUCOPHAGE-XR  Take 500 mg by mouth 2 (two) times daily. Takes 2 tablets twice a day     potassium chloride 10 MEQ tablet  Commonly known as:  K-DUR  Take 1 tablet daily for 15 days     Vitamin D (Ergocalciferol) 50000 UNITS Caps capsule  Commonly known as:  DRISDOL  Take 50,000 Units by mouth every 7 (seven) days.        Allergies:  Allergies  Allergen Reactions  . Vicodin [Hydrocodone-Acetaminophen] Nausea Only    Past Medical History  Diagnosis Date  . Diabetes mellitus without complication     Past Surgical History  Procedure Laterality Date  . Cholecystectomy    . Appendectomy    .  Pancreas surgery      Tumor head of pancreas, incompletely resected    Family History  Problem Relation Age of Onset  . Hyperlipidemia Father   . Hypertension Father   . Coronary artery disease Father   . Diabetes Brother     Social History:  reports that she has never smoked. She has never used smokeless tobacco. Her alcohol and drug histories are not on file.    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 Was treated with radiation and subsequently her tumor has been stable and asymptomatic. Biopsy showed Adenocarcinoma, Islet Cell type. Her notes indicate tumor was 6.9 cm on ultrasound in 2012; periodically has had MRI done with no growth Report of last scan is not available      Lipids: Currently untreated  Lab Results  Component Value Date   CHOL 200 04/29/2013   HDL 51.10 04/29/2013   LDLCALC 124* 04/29/2013   TRIG 124.0 04/29/2013   CHOLHDL 4 04/29/2013       She has had severe vitamin D deficiency diagnosed in 2009 with a level of 5.5, has not restarted supplements recently  No history of hypertension   LABS:  Pending  Physical Examination:  BP 108/72  Pulse 95  Temp(Src) 97.8 F (36.6 C)  Resp 14  Ht 4\' 11"  (1.499 m)  Wt 135 lb 6.4 oz (61.417 kg)  BMI 27.33 kg/m2  SpO2 99%  LMP 05/08/2013     ASSESSMENT:  Diabetes type 2, uncontrolled      Her blood sugars are significantly better since her last visit with starting basal insulin as well as Glyxambi She has done better with this regimen compared to [facility] and also is tolerating this well Her weight has improved with the change also She has been very comfortable in doing the insulin adjustment and recent fasting readings are good She is also done better with exercise regimen and home glucose monitoring May consider leaving off her [place]ad[place] component on the next  visit  Hypercholesterolemia:  We discussed treatment on the next visit  PLAN:   Continue same regimen  Also recommended her that she check on the followup for her pancreatic CT scan which needs to be done every 2 years   [facility] 06/03/2013,  4:10 PM

## 2013-06-04 ENCOUNTER — Telehealth: Payer: Self-pay | Admitting: Endocrinology

## 2013-06-04 ENCOUNTER — Other Ambulatory Visit: Payer: Self-pay | Admitting: *Deleted

## 2013-06-04 MED ORDER — INSULIN PEN NEEDLE 32G X 4 MM MISC: Status: DC

## 2013-06-04 MED ORDER — INSULIN DETEMIR 100 UNIT/ML FLEXPEN: 14.0000 [IU] | PEN_INJECTOR | Freq: Every day | SUBCUTANEOUS | Status: DC

## 2013-06-04 NOTE — Telephone Encounter (Signed)
Samples left °

## 2013-06-04 NOTE — Telephone Encounter (Signed)
rx sent

## 2013-06-04 NOTE — Telephone Encounter (Signed)
Please refill and sample requests to [facility]

## 2013-06-04 NOTE — Telephone Encounter (Signed)
Pt states she needs the levemir pens and needles  [facility] at [address]:)  Pt wants to know if we have any samples her insurance is transferring over

## 2013-08-19 ENCOUNTER — Other Ambulatory Visit (INDEPENDENT_AMBULATORY_CARE_PROVIDER_SITE_OTHER): Payer: BC Managed Care – PPO

## 2013-08-19 DIAGNOSIS — E1165 Type 2 diabetes mellitus with hyperglycemia: Principal | ICD-10-CM

## 2013-08-19 DIAGNOSIS — E782 Mixed hyperlipidemia: Secondary | ICD-10-CM

## 2013-08-19 DIAGNOSIS — IMO0001 Reserved for inherently not codable concepts without codable children: Secondary | ICD-10-CM

## 2013-08-19 LAB — BASIC METABOLIC PANEL
BUN: 22 mg/dL (ref 6–23)
CALCIUM: 8.9 mg/dL (ref 8.4–10.5)
CO2: 28 mEq/L (ref 19–32)
Chloride: 108 mEq/L (ref 96–112)
Creatinine, Ser: 0.6 mg/dL (ref 0.4–1.2)
GFR: 116.76 mL/min (ref >60.00)
Glucose, Bld: 112 mg/dL — ABNORMAL HIGH (ref 70–99)
POTASSIUM: 4.3 meq/L (ref 3.5–5.1)
SODIUM: 139 meq/L (ref 135–145)

## 2013-08-19 LAB — LDL CHOLESTEROL, DIRECT: LDL DIRECT: 134.4 mg/dL

## 2013-08-19 LAB — HEMOGLOBIN A1C: Hgb A1c MFr Bld: 6.1 % (ref 4.6–6.5)

## 2013-08-26 ENCOUNTER — Encounter: Payer: Self-pay | Admitting: Endocrinology

## 2013-08-26 ENCOUNTER — Ambulatory Visit (INDEPENDENT_AMBULATORY_CARE_PROVIDER_SITE_OTHER): Payer: BC Managed Care – PPO | Admitting: Endocrinology

## 2013-08-26 VITALS — BP 104/68 | HR 79 | Temp 98.2°F | Resp 12 | Ht 59.0 in | Wt 131.4 lb

## 2013-08-26 DIAGNOSIS — C254 Malignant neoplasm of endocrine pancreas: Secondary | ICD-10-CM

## 2013-08-26 DIAGNOSIS — E559 Vitamin D deficiency, unspecified: Secondary | ICD-10-CM

## 2013-08-26 DIAGNOSIS — E119 Type 2 diabetes mellitus without complications: Secondary | ICD-10-CM

## 2013-08-26 DIAGNOSIS — E782 Mixed hyperlipidemia: Secondary | ICD-10-CM

## 2013-08-26 MED ORDER — ATORVASTATIN CALCIUM 10 MG PO TABS: 10.0000 mg | ORAL_TABLET | Freq: Every day | ORAL | Status: DC

## 2013-08-26 NOTE — Patient Instructions (Addendum)
Please check blood sugars at least half the time about 2 hours after any meal and times per week on waking up. Please bring blood sugar monitor to each visit  May try Lipitor 3x weekly first

## 2013-08-26 NOTE — Progress Notes (Signed)
Patient ID: Anna Jennings, female   DOB: [birth year], 43 y.o.   MRN: [id number]   Reason for Appointment : Followup for Type 2 Diabetes  History of Present Illness          Diagnosis: Type 2 diabetes mellitus, date of diagnosis: 2007       Past history: She was initially diagnosed with a two-hour postprandial glucose of 218 in 08/2005 and baseline A1c of 5.7 Apparently she was not treated with oral hypoglycemic drugs initially and probably started on metformin in 2009. She did have diarrhea with the regular metformin but tolerated extended release preparation better with no side effects even on 2000 mg She thinks her blood sugars are fairly well controlled initially but a couple of years later with higher readings she was given Amaryl also. The dose of this was gradually increased Her A1c readings have been ranging from 6.5-7.2 in between 2011 and 2013 but no further followup done after 11/2011 She had been out of her medications prior to her initial visit here in 12/14 and her blood sugars were progressively higher.  She had side effects of  bloating and swelling with pioglitazone and her [name] was stopped  Recent history:  Because of poor control and A1c of 8.5 in 3/15 she was started on Levemir insulin This has been gradually increased and she has taken 17 units since about 6 weeks ago She has also been taking  Glyxambi 10/5 since 3/15 The blood sugars are significantly better and fairly consistent although she has been checking them mostly in the mornings  recently She said and [place] she was not able to be consistent with diet but has been trying to watch carbohydrates as much as possible She is also on maximum dose metformin ER 2000mg . No side effects from extended release metformin       Oral hypoglycemic drugs the patient is taking are: Glyxambi,  metformin ER 2 g      Side effects from medications have been: Diarrhea from regular metformin   Glucose monitoring:   2.5 times a day       Glucometer: One Touch.   PREMEAL Breakfast  PCB  Dinner Bedtime Overall  Glucose range: 84- 123   97-143   91   104- 119   Mean/median:     104    Hypoglycemia: none     Glycemic control:  Lab Results  Component Value Date   HGBA1C 6.1 08/19/2013   HGBA1C 8.5* 04/29/2013   No results found for this basename: GLUF,  MICROALBUR,  LDLCALC,  CREATININE    Self-care: The diet that the patient has been following is: tries to limit portions, usually has some carbohydrate at every meal     Meals: 2 meals per day.  no breakfast, am snack, sandwich at lunch;  Some rice at meals         Exercise: Usually Walking 27min, 5/7; has treadmill, less exercise recently when she was out of town.            Dietician visit: Most recent:2011?                 Compliance with the medical regimen:  Retinal exam: Most recent: 02/07/13 Weight history:  Wt Readings from Last 3 Encounters:  08/26/13 131 lb 6.4 oz (59.603 kg)  06/03/13 135 lb 6.4 oz (61.417 kg)  05/06/13 140 lb 6.4 oz (63.685 kg)       Medication List  This list is accurate as of: 08/26/13 10:07 AM.  Always use your most recent med list.               Empagliflozin-Linagliptin 10-5 MG Tabs  Commonly known as:  GLYXAMBI  Take 10 mg by mouth daily before breakfast.     Insulin Detemir 100 UNIT/ML Pen  Commonly known as:  LEVEMIR FLEXTOUCH  Inject 14 Units into the skin daily.     Insulin Pen Needle 32G X 4 MM Misc  Use one pen needle per day with Levemir     metFORMIN 500 MG 24 hr tablet  Commonly known as:  GLUCOPHAGE-XR  Take 500 mg by mouth 2 (two) times daily. Takes 2 tablets twice a day     potassium chloride 10 MEQ tablet  Commonly known as:  K-DUR  Take 1 tablet daily for 15 days     Vitamin D (Ergocalciferol) 50000 UNITS Caps capsule  Commonly known as:  DRISDOL  Take 50,000 Units by mouth every 7 (seven) days.        Allergies:  Allergies  Allergen Reactions  . Vicodin  [Hydrocodone-Acetaminophen] Nausea Only    Past Medical History  Diagnosis Date  . Diabetes mellitus without complication     Past Surgical History  Procedure Laterality Date  . Cholecystectomy    . Appendectomy    . Pancreas surgery      Tumor head of pancreas, incompletely resected    Family History  Problem Relation Age of Onset  . Hyperlipidemia Father   . Hypertension Father   . Coronary artery disease Father   . Diabetes Brother     Social History:  reports that she has never smoked. She has never used smokeless tobacco. Her alcohol and drug histories are not on file.    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 Was treated with radiation and subsequently her tumor has been stable and asymptomatic. Biopsy showed Adenocarcinoma, Islet Cell type. Her notes indicate tumor was 6.9 cm on ultrasound in 2012; periodically has had MRI done with no growth Report of last scan is not available      Lipids: Currently untreated and she does have a fairly significant family history of CAD She thinks she was given an unknown statin drug previously and had some joint pains and had to stop this, does not remember the name  Lab Results  Component Value Date   CHOL 200 04/29/2013   HDL 51.10 04/29/2013   LDLCALC 124* 04/29/2013   LDLDIRECT 134.4 08/19/2013   TRIG 124.0 04/29/2013   CHOLHDL 4 04/29/2013      She has had severe vitamin D deficiency diagnosed in 2009 with a level of 5.5, he is taking her weekly a supplement now. She does not have a followup with any PCP    No history of hypertension    Physical Examination:  BP 104/68  Pulse 79  Temp(Src) 98.2 F (36.8 C)  Resp 12  Ht 4\' 11"  (1.499 m)  Wt 131 lb 6.4 oz (59.603 kg)  BMI 26.53 kg/m2  SpO2 98%     ASSESSMENT/PLAN:   Diabetes type 2 with BMI 26  Her blood sugars are significantly better   with starting basal insulin as well as Glyxambi  A1c is now upper normal and significantly better than before without hypoglycemia However did not  have many readings lately and needs to do more readings in the afternoon and evening Recently has fairly good blood sugars at home even after meals Although she has not been consistent with exercise recently she has kept her weight down and also She will try to check more postprandial readings, discussed targets Also discussed adjusting Levemir to keep morning sugars in the 80-120 range  Hypercholesterolemia:  We discussed treatment with statin drugs and explained the benefits with her family history as well as her diabetes and hyperlipidemia and she agrees to start Lipitor  Pancreatic tumor: She will bring her last report and also call to let us know if she had an MRI versus CT scan  Will need to followup with vitamin D level also  Counseling time over 50% of today's 25 minute visit   KUMAR,AJAY 08/26/2013, 10:07 AM

## 2013-09-09 ENCOUNTER — Other Ambulatory Visit: Payer: Self-pay | Admitting: *Deleted

## 2013-09-09 MED ORDER — EMPAGLIFLOZIN-LINAGLIPTIN 10-5 MG PO TABS: 10.0000 mg | ORAL_TABLET | Freq: Every day | ORAL | Status: DC

## 2013-10-02 ENCOUNTER — Telehealth: Payer: Self-pay

## 2013-10-02 NOTE — Telephone Encounter (Signed)
Diabetic Bundle. Pt scheduled for labs on 9/21 Lipid Panel needs to be collected orders in computer.

## 2013-11-18 ENCOUNTER — Other Ambulatory Visit: Payer: BC Managed Care – PPO

## 2013-11-18 DIAGNOSIS — E559 Vitamin D deficiency, unspecified: Secondary | ICD-10-CM

## 2013-11-18 DIAGNOSIS — E782 Mixed hyperlipidemia: Secondary | ICD-10-CM

## 2013-11-18 DIAGNOSIS — E119 Type 2 diabetes mellitus without complications: Secondary | ICD-10-CM

## 2013-11-18 LAB — COMPREHENSIVE METABOLIC PANEL
ALBUMIN: 4 g/dL (ref 3.5–5.2)
ALT: 14 U/L (ref 0–35)
AST: 17 U/L (ref 0–37)
Alkaline Phosphatase: 79 U/L (ref 39–117)
BUN: 13 mg/dL (ref 6–23)
CO2: 28 mEq/L (ref 19–32)
Calcium: 9.1 mg/dL (ref 8.4–10.5)
Chloride: 107 mEq/L (ref 96–112)
Creatinine, Ser: 0.6 mg/dL (ref 0.4–1.2)
GFR: 126.29 mL/min (ref >60.00)
GLUCOSE: 114 mg/dL — AB (ref 70–99)
POTASSIUM: 4.1 meq/L (ref 3.5–5.1)
SODIUM: 140 meq/L (ref 135–145)
TOTAL PROTEIN: 7.5 g/dL (ref 6.0–8.3)
Total Bilirubin: 0.4 mg/dL (ref 0.2–1.2)

## 2013-11-18 LAB — LIPID PANEL
CHOLESTEROL: 183 mg/dL (ref 0–200)
HDL: 48.5 mg/dL (ref >39.00)
LDL Cholesterol: 113 mg/dL — ABNORMAL HIGH (ref 0–99)
NonHDL: 134.5
Total CHOL/HDL Ratio: 4
Triglycerides: 109 mg/dL (ref 0.0–149.0)
VLDL: 21.8 mg/dL (ref 0.0–40.0)

## 2013-11-18 LAB — HEMOGLOBIN A1C: Hgb A1c MFr Bld: 6.4 % (ref 4.6–6.5)

## 2013-11-18 LAB — MICROALBUMIN / CREATININE URINE RATIO
Creatinine,U: 93.6 mg/dL
Microalb Creat Ratio: 0.6 mg/g (ref 0.0–30.0)
Microalb, Ur: 0.6 mg/dL (ref 0.0–1.9)

## 2013-11-19 LAB — VITAMIN D 25 HYDROXY (VIT D DEFICIENCY, FRACTURES): VITD: 34.97 ng/mL (ref 30.00–100.00)

## 2013-11-25 ENCOUNTER — Other Ambulatory Visit: Payer: Self-pay | Admitting: *Deleted

## 2013-11-25 ENCOUNTER — Encounter: Payer: Self-pay | Admitting: Endocrinology

## 2013-11-25 ENCOUNTER — Ambulatory Visit (INDEPENDENT_AMBULATORY_CARE_PROVIDER_SITE_OTHER): Payer: BC Managed Care – PPO | Admitting: Endocrinology

## 2013-11-25 VITALS — BP 108/80 | HR 93 | Temp 97.7°F | Resp 14 | Ht 59.0 in | Wt 136.6 lb

## 2013-11-25 DIAGNOSIS — C254 Malignant neoplasm of endocrine pancreas: Secondary | ICD-10-CM

## 2013-11-25 DIAGNOSIS — E782 Mixed hyperlipidemia: Secondary | ICD-10-CM

## 2013-11-25 DIAGNOSIS — Z23 Encounter for immunization: Secondary | ICD-10-CM

## 2013-11-25 DIAGNOSIS — E559 Vitamin D deficiency, unspecified: Secondary | ICD-10-CM

## 2013-11-25 DIAGNOSIS — E119 Type 2 diabetes mellitus without complications: Secondary | ICD-10-CM

## 2013-11-25 MED ORDER — EMPAGLIFLOZIN-LINAGLIPTIN 10-5 MG PO TABS: 10.0000 mg | ORAL_TABLET | Freq: Every day | ORAL | Status: DC

## 2013-11-25 MED ORDER — METFORMIN HCL ER 500 MG PO TB24: ORAL_TABLET | ORAL | Status: DC

## 2013-11-25 NOTE — Progress Notes (Signed)
Patient ID: Anna Jennings, female   DOB: May 12, 1970, 43 y.o.   MRN: [id number]   Reason for Appointment : Followup for Type 2 Diabetes  History of Present Illness          Diagnosis: Type 2 diabetes mellitus, date of diagnosis: 2007       Past history: She was initially diagnosed with a two-hour postprandial glucose of 218 in 08/2005 and baseline A1c of 5.7 Apparently she was not treated with oral hypoglycemic drugs initially and probably started on metformin in 2009. She did have diarrhea with the regular metformin but tolerated extended release preparation better with no side effects even on 2000 mg She thinks her blood sugars are fairly well controlled initially but a couple of years later with higher readings she was given Amaryl also. The dose of this was gradually increased Her A1c readings have been ranging from 6.5-7.2 in between 2011 and 2013 but no further followup done after 11/2011 She had been out of her medications prior to her initial visit here in 12/14 and her blood sugars were progressively higher.  She had side effects of  bloating and swelling with pioglitazone and her [name] was stopped  Recent history:  Because of poor control and A1c of 8.5 in 3/15 she was started on Levemir insulin This has been continued unchanged since her last visit and she has taken 17 units  She does check her blood sugar somewhat infrequently now but these are fairly consistent She has also been taking  Glyxambi 10/5 since 3/15 Her A1c has been upper normal although slightly higher compared to her last visit Despite reminders she has not checked her readings after meals at all She is also on maximum dose metformin ER 2000mg . No side effects from extended release metformin       Oral hypoglycemic drugs the patient is taking are: Glyxambi,  metformin ER 2 g    INSULIN: Levemir 17 units at bedtime    Side effects from medications have been: Diarrhea from regular metformin   Glucose monitoring:  2.5  times a day       Glucometer: One Touch.   PREMEAL Breakfast Lunch Dinner Bedtime Overall  Glucose range:  91-132     105    Mean/median: 108     107    Hypoglycemia: minimal  with symptoms only if she is late for lunch     Glycemic control:  Lab Results  Component Value Date   HGBA1C 6.4 11/18/2013   HGBA1C 6.1 08/19/2013   HGBA1C 8.5* 04/29/2013   No results found for this basename: GLUF,  MICROALBUR,  LDLCALC,  CREATININE    Self-care: The diet that the patient has been following is: tries to limit portions, usually has some carbohydrate at every meal     Meals: 2 meals per day.  no breakfast, am snack, sandwich at lunch;  Some rice at meals         Exercise: Usually Walking 36min, 5/7; has treadmill also            Dietician visit: Most recent:2011?                 Compliance with the medical regimen:  Retinal exam: Most recent: 02/07/13 Weight history:  Wt Readings from Last 3 Encounters:  11/25/13 136 lb 9.6 oz (61.961 kg)  08/26/13 131 lb 6.4 oz (59.603 kg)  06/03/13 135 lb 6.4 oz (61.417 kg)       Medication List  This list is accurate as of: 11/25/13 10:24 AM.  Always use your most recent med list.               atorvastatin 10 MG tablet  Commonly known as:  LIPITOR  Take 1 tablet (10 mg total) by mouth daily.     Empagliflozin-Linagliptin 10-5 MG Tabs  Commonly known as:  GLYXAMBI  Take 10 mg by mouth daily before breakfast.     Insulin Detemir 100 UNIT/ML Pen  Commonly known as:  LEVEMIR  Inject 17 Units into the skin daily.     Insulin Pen Needle 32G X 4 MM Misc  Use one pen needle per day with Levemir     metFORMIN 500 MG 24 hr tablet  Commonly known as:  GLUCOPHAGE-XR  Take 500 mg by mouth 2 (two) times daily. Takes 2 tablets twice a day     potassium chloride 10 MEQ tablet  Commonly known as:  K-DUR  Take 1 tablet daily for 15 days     Vitamin D (Ergocalciferol) 50000 UNITS Caps capsule  Commonly known as:  DRISDOL  Take 50,000 Units  by mouth every 7 (seven) days.        Allergies:  Allergies  Allergen Reactions  . Vicodin [Hydrocodone-Acetaminophen] Nausea Only    Past Medical History  Diagnosis Date  . Diabetes mellitus without complication     Past Surgical History  Procedure Laterality Date  . Cholecystectomy    . Appendectomy    . Pancreas surgery      Tumor head of pancreas, incompletely resected    Family History  Problem Relation Age of Onset  . Hyperlipidemia Father   . Hypertension Father   . Coronary artery disease Father   . Diabetes Brother   . Heart disease Paternal Grandfather     Social History:  reports that she has never smoked. She has never used smokeless tobacco. Her alcohol and drug histories are not on file.    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 Was treated with radiation and subsequently her tumor has been stable and asymptomatic. Biopsy showed Adenocarcinoma, Islet Cell type. Her notes indicate tumor was 6.9 cm on ultrasound in 2012; periodically has had MRI done with no growth Report of last scan is not available and she has not brought her port as discussed on the previous visit      Lipids: Currently on Lipitor starting from 6/15. She is however taking this irregularly as she tends to forget to take it in the evenings She  does have a fairly significant family history of CAD LDL is slightly better but still over 100  Lab Results  Component Value Date   CHOL 183 11/18/2013   HDL 48.50 11/18/2013   LDLCALC 113* 11/18/2013   LDLDIRECT 134.4 08/19/2013   TRIG 109.0 11/18/2013   CHOLHDL 4 11/18/2013      She has had severe vitamin D deficiency diagnosed in 2009 with a level of 5.5. Her level is normal but she is taking unknown dose of vitamin D 3 OTC, 3 times a week She does not have any PCP    No history of hypertension    Physical  Examination:  BP 131/94  Pulse 93  Temp(Src) 97.7 F (36.5 C)  Resp 14  Ht 4\' 11"  (1.499 m)  Wt 136 lb 9.6  oz (61.961 kg)  BMI 27.57 kg/m2  SpO2 98%    no ankle edema  Repeat blood pressure was normal   ASSESSMENT/PLAN:   Diabetes type 2 with BMI 27  Her blood sugars are consistently well controlled with starting basal insulin as well as Glyxambi in 04/2013  A1c is again upper normal  However her meter does not show  many readings lately and needs to do more readings after meals, currently only has one reading at bedtime Has been on a steady dose of Levemir of 17 units with consistent readings in the mornings including on the lab She appears to have gained weight but she thinks this is premenstrual She generally trying to watch her diet and also walking fairly regularly  Hypercholesterolemia with family history of CAD:  Taking Lipitor without side effects but has been irregular with it. Advised her to take it daily and we will recheck her levels on the next visit  Pancreatic tumor: She will bring her last report and also call to let us know if she had an MRI versus CT scan versus ultrasound  Will need to continue same dose of vitamin D  Counseling time over 50% of today's 25 minute visit   Shona Pardo 11/25/2013, 10:24 AM

## 2013-11-25 NOTE — Patient Instructions (Addendum)
Please check blood sugars at least half the time about 2 hours after any meal and 2-3 times per week on waking up. Please bring blood sugar monitor to each visit  Take Lipitor daily  Call report of last MRI scan

## 2014-03-03 ENCOUNTER — Other Ambulatory Visit: Payer: BC Managed Care – PPO

## 2014-03-07 ENCOUNTER — Other Ambulatory Visit: Payer: PRIVATE HEALTH INSURANCE

## 2014-03-10 ENCOUNTER — Ambulatory Visit: Payer: BC Managed Care – PPO | Admitting: Endocrinology

## 2014-03-17 ENCOUNTER — Other Ambulatory Visit: Payer: Self-pay | Admitting: *Deleted

## 2014-03-17 ENCOUNTER — Other Ambulatory Visit (INDEPENDENT_AMBULATORY_CARE_PROVIDER_SITE_OTHER): Payer: PRIVATE HEALTH INSURANCE

## 2014-03-17 DIAGNOSIS — E1165 Type 2 diabetes mellitus with hyperglycemia: Secondary | ICD-10-CM

## 2014-03-17 DIAGNOSIS — IMO0002 Reserved for concepts with insufficient information to code with codable children: Secondary | ICD-10-CM

## 2014-03-17 LAB — LIPID PANEL
CHOL/HDL RATIO: 3
Cholesterol: 185 mg/dL (ref 0–200)
HDL: 53.3 mg/dL (ref >39.00)
LDL Cholesterol: 106 mg/dL — ABNORMAL HIGH (ref 0–99)
NonHDL: 131.7
TRIGLYCERIDES: 131 mg/dL (ref 0.0–149.0)
VLDL: 26.2 mg/dL (ref 0.0–40.0)

## 2014-03-17 LAB — COMPREHENSIVE METABOLIC PANEL
ALT: 12 U/L (ref 0–35)
AST: 17 U/L (ref 0–37)
Albumin: 4.2 g/dL (ref 3.5–5.2)
Alkaline Phosphatase: 61 U/L (ref 39–117)
BUN: 20 mg/dL (ref 6–23)
CHLORIDE: 102 meq/L (ref 96–112)
CO2: 26 mEq/L (ref 19–32)
Calcium: 9.2 mg/dL (ref 8.4–10.5)
Creatinine, Ser: 0.61 mg/dL (ref 0.40–1.20)
GFR: 114.24 mL/min (ref >60.00)
GLUCOSE: 113 mg/dL — AB (ref 70–99)
Potassium: 3.7 mEq/L (ref 3.5–5.1)
SODIUM: 137 meq/L (ref 135–145)
Total Bilirubin: 0.5 mg/dL (ref 0.2–1.2)
Total Protein: 7.4 g/dL (ref 6.0–8.3)

## 2014-03-17 LAB — HEMOGLOBIN A1C: HEMOGLOBIN A1C: 6.7 % — AB (ref 4.6–6.5)

## 2014-03-24 ENCOUNTER — Ambulatory Visit (INDEPENDENT_AMBULATORY_CARE_PROVIDER_SITE_OTHER): Payer: PRIVATE HEALTH INSURANCE | Admitting: Endocrinology

## 2014-03-24 ENCOUNTER — Encounter: Payer: Self-pay | Admitting: Endocrinology

## 2014-03-24 VITALS — BP 104/72 | HR 96 | Temp 98.3°F | Resp 14 | Ht 59.0 in | Wt 139.0 lb

## 2014-03-24 DIAGNOSIS — E782 Mixed hyperlipidemia: Secondary | ICD-10-CM

## 2014-03-24 DIAGNOSIS — IMO0002 Reserved for concepts with insufficient information to code with codable children: Secondary | ICD-10-CM

## 2014-03-24 DIAGNOSIS — E1165 Type 2 diabetes mellitus with hyperglycemia: Secondary | ICD-10-CM

## 2014-03-24 MED ORDER — PRAVASTATIN SODIUM 20 MG PO TABS: 20.0000 mg | ORAL_TABLET | Freq: Every day | ORAL | Status: DC

## 2014-03-24 NOTE — Patient Instructions (Signed)
Please check blood sugars at least half the time about 2 hours after any meal and 3 times per week on waking up.  Please bring blood sugar monitor to each visit.  Recommended blood sugar levels about 2 hours after meal is 140-180 and on waking up 90-130   

## 2014-03-24 NOTE — Progress Notes (Signed)
Patient ID: Anna Jennings, female   DOB: [birth year], 44 y.o.   MRN: [id number]   Reason for Appointment : Followup for Type 2 Diabetes  History of Present Illness          Diagnosis: Type 2 diabetes mellitus, date of diagnosis: 2007       Past history: She was initially diagnosed with a two-hour postprandial glucose of 218 in 08/2005 and baseline A1c of 5.7 Apparently she was not treated with oral hypoglycemic drugs initially and probably started on metformin in 2009. She did have diarrhea with the regular metformin but tolerated extended release preparation better with no side effects even on 2000 mg She thinks her blood sugars are fairly well controlled initially but a couple of years later with higher readings she was given Amaryl also. The dose of this was gradually increased Her A1c readings have been ranging from 6.5-7.2 in between 2011 and 2013 but no further followup done after 11/2011 She had been out of her medications prior to her initial visit here in 12/14 and her blood sugars were progressively higher.  She had side effects of  bloating and swelling with pioglitazone and her [name] was stopped Because of poor control and A1c of 8.5 in 3/15 she was started on Levemir insulin  Recent history:  Levemir has been continued unchanged for some time and she has taken 17 units. She has checked her sugar a little more often but mostly in the mornings or at lunchtime Usually not eating much at breakfast No postprandial readings recently except occasionally after lunch Although her fasting readings are usually fairly good she has a couple of high readings, at least once with forgetting her Levemir at night  She has also been taking  Glyxambi 10/5mg  since 3/15 as well as metformin ER 2000mg . Her A1c has been upper normal although slightly higher again compared to her last visit. Does not feel hypoglycemic Although she is generally trying to walk in the mornings she has gained a couple pounds  again       Oral hypoglycemic drugs the patient is taking are: Glyxambi 10/5,  metformin ER 2 g    INSULIN: Levemir 17 units at bedtime    Side effects from medications have been: Diarrhea from regular metformin   Glucose monitoring:  0.7  times a day       Glucometer: One Touch.   PRE-MEAL Breakfast Lunch  4-5 PM   overnight  Overall  Glucose range:  93-177   97-179   107, 158   192, 203    Median:      120     Hypoglycemia: none     Glycemic control:  Lab Results  Component Value Date   HGBA1C 6.7* 03/17/2014   HGBA1C 6.4 11/18/2013   HGBA1C 6.1 08/19/2013   Lab Results  Component Value Date   MICROALBUR 0.6 11/18/2013   LDLCALC 106* 03/17/2014   CREATININE 0.61 03/17/2014    Self-care: The diet that the patient has been following is: tries to limit portions, usually has some carbohydrate at every meal     Meals: 2 meals per day.  no breakfast, am snack, sandwich at lunch;  Some rice at meals         Exercise: Usually Walking 40min, 5/7; has treadmill also            Dietician visit: Most recent:2011?                 Compliance with  the medical regimen: Good Retinal exam: Most recent: 02/07/13 Weight history:  Wt Readings from Last 3 Encounters:  03/24/14 139 lb (63.05 kg)  11/25/13 136 lb 9.6 oz (61.961 kg)  08/26/13 131 lb 6.4 oz (59.603 kg)       Medication List       This list is accurate as of: 03/24/14  4:48 PM.  Always use your most recent med list.               atorvastatin 10 MG tablet  Commonly known as:  LIPITOR  Take 1 tablet (10 mg total) by mouth daily.     cholecalciferol 1000 UNITS tablet  Commonly known as:  VITAMIN D  Take 1,000 Units by mouth daily. Takes 2 tablets daily     Empagliflozin-Linagliptin 10-5 MG Tabs  Commonly known as:  GLYXAMBI  Take 10 mg by mouth daily before breakfast.     Insulin Detemir 100 UNIT/ML Pen  Commonly known as:  LEVEMIR  Inject 17 Units into the skin daily.     Insulin Pen Needle 32G X 4 MM Misc   Use one pen needle per day with Levemir     metFORMIN 500 MG 24 hr tablet  Commonly known as:  GLUCOPHAGE-XR  Takes 2 tablets twice a day        Allergies:  Allergies  Allergen Reactions  . Vicodin [Hydrocodone-Acetaminophen] Nausea Only    Past Medical History  Diagnosis Date  . Diabetes mellitus without complication     Past Surgical History  Procedure Laterality Date  . Cholecystectomy    . Appendectomy    . Pancreas surgery      Tumor head of pancreas, incompletely resected    Family History  Problem Relation Age of Onset  . Hyperlipidemia Father   . Hypertension Father   . Coronary artery disease Father   . Diabetes Brother   . Heart disease Paternal Grandfather     Social History:  reports that she has never smoked. She has never used smokeless tobacco. Her alcohol and drug histories are not on file.    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 Was treated with radiation and subsequently her tumor has been stable and asymptomatic. Biopsy showed Adenocarcinoma, Islet Cell type. Her notes indicate tumor was 6.9 cm on ultrasound in 2012; periodically has had MRI done with no growth Report of last scan is not available and she again has not brought her information as requested      Lipids: was on Lipitor until a couple of months ago when she stopped it because of generalized aches and pains which were significant   She  does have a fairly significant family history of CAD LDL is  better but still over 100, tends to have higher direct LDL  Lab Results  Component Value Date   CHOL 185 03/17/2014   HDL 53.30 03/17/2014   LDLCALC 106* 03/17/2014   LDLDIRECT 134.4 08/19/2013   TRIG 131.0 03/17/2014   CHOLHDL 3 03/17/2014      She has had severe vitamin D deficiency diagnosed in 2009 with a level of 5.5. Her level is normal  as of 9/15     No history of hypertension    Physical Examination:  BP 104/72 mmHg  Pulse 96  Temp(Src) 98.3 F (36.8 C)  Resp 14  Ht 4\' 11"  (1.499 m)  Wt 139 lb (63.05 kg)  BMI 28.06 kg/m2  SpO2 98%    ASSESSMENT/PLAN:   Diabetes type 2 with BMI 27  Her blood sugars are usually  well controlled using basal insulin as well as Glyxambi and metformin   A1c is slightly higher, previously upper normal  She does however need to check more readings after meals For better compliance with her evening insulin she can try taking it right at suppertime She generally trying to watch her diet and also walking fairly regularly  Hypercholesterolemia with family history of CAD:   appears to have had myalgias from Lipitor and can try Pravastatin 20 mg  Pancreatic tumor: She will call to let us know if she had an MRI versus CT scan versus ultrasound and possibly discuss with radiologist the ideal study for this      [facility] 03/24/2014, 4:48 PM

## 2014-04-05 ENCOUNTER — Other Ambulatory Visit: Payer: Self-pay | Admitting: Endocrinology

## 2014-06-10 ENCOUNTER — Other Ambulatory Visit: Payer: Self-pay | Admitting: Endocrinology

## 2014-06-16 ENCOUNTER — Other Ambulatory Visit (INDEPENDENT_AMBULATORY_CARE_PROVIDER_SITE_OTHER): Payer: PRIVATE HEALTH INSURANCE

## 2014-06-16 DIAGNOSIS — E1165 Type 2 diabetes mellitus with hyperglycemia: Secondary | ICD-10-CM

## 2014-06-16 DIAGNOSIS — IMO0002 Reserved for concepts with insufficient information to code with codable children: Secondary | ICD-10-CM

## 2014-06-16 LAB — COMPREHENSIVE METABOLIC PANEL
ALBUMIN: 4.2 g/dL (ref 3.5–5.2)
ALT: 14 U/L (ref 0–35)
AST: 18 U/L (ref 0–37)
Alkaline Phosphatase: 69 U/L (ref 39–117)
BILIRUBIN TOTAL: 0.5 mg/dL (ref 0.2–1.2)
BUN: 15 mg/dL (ref 6–23)
CO2: 23 mEq/L (ref 19–32)
Calcium: 9.2 mg/dL (ref 8.4–10.5)
Chloride: 105 mEq/L (ref 96–112)
Creatinine, Ser: 0.64 mg/dL (ref 0.40–1.20)
GFR: 107.95 mL/min (ref >60.00)
Glucose, Bld: 114 mg/dL — ABNORMAL HIGH (ref 70–99)
POTASSIUM: 3.5 meq/L (ref 3.5–5.1)
SODIUM: 138 meq/L (ref 135–145)
TOTAL PROTEIN: 7.4 g/dL (ref 6.0–8.3)

## 2014-06-16 LAB — LIPID PANEL
CHOL/HDL RATIO: 4
Cholesterol: 197 mg/dL (ref 0–200)
HDL: 52.6 mg/dL (ref >39.00)
LDL Cholesterol: 111 mg/dL — ABNORMAL HIGH (ref 0–99)
NonHDL: 144.4
TRIGLYCERIDES: 167 mg/dL — AB (ref 0.0–149.0)
VLDL: 33.4 mg/dL (ref 0.0–40.0)

## 2014-06-16 LAB — HEMOGLOBIN A1C: Hgb A1c MFr Bld: 6.9 % — ABNORMAL HIGH (ref 4.6–6.5)

## 2014-06-23 ENCOUNTER — Ambulatory Visit: Payer: PRIVATE HEALTH INSURANCE | Admitting: Endocrinology

## 2014-07-07 LAB — HM DIABETES EYE EXAM

## 2014-07-10 ENCOUNTER — Telehealth: Payer: Self-pay | Admitting: Endocrinology

## 2014-07-10 NOTE — Telephone Encounter (Signed)
patient need refill of metformin 500 mg,  Glyxambi 10-25 mg

## 2014-07-12 ENCOUNTER — Other Ambulatory Visit: Payer: Self-pay | Admitting: Endocrinology

## 2014-07-14 ENCOUNTER — Ambulatory Visit: Payer: PRIVATE HEALTH INSURANCE | Admitting: Endocrinology

## 2014-07-18 ENCOUNTER — Encounter: Payer: Self-pay | Admitting: Endocrinology

## 2014-08-08 ENCOUNTER — Encounter: Payer: Self-pay | Admitting: Endocrinology

## 2014-08-08 ENCOUNTER — Ambulatory Visit (INDEPENDENT_AMBULATORY_CARE_PROVIDER_SITE_OTHER): Payer: PRIVATE HEALTH INSURANCE | Admitting: Endocrinology

## 2014-08-08 VITALS — BP 120/78 | HR 92 | Temp 97.9°F | Resp 14 | Ht 59.0 in | Wt 140.0 lb

## 2014-08-08 DIAGNOSIS — E1165 Type 2 diabetes mellitus with hyperglycemia: Secondary | ICD-10-CM | POA: Diagnosis not present

## 2014-08-08 DIAGNOSIS — E782 Mixed hyperlipidemia: Secondary | ICD-10-CM

## 2014-08-08 DIAGNOSIS — IMO0002 Reserved for concepts with insufficient information to code with codable children: Secondary | ICD-10-CM

## 2014-08-08 MED ORDER — EPINEPHRINE 0.3 MG/0.3ML IJ SOAJ: 0.3000 mg | Freq: Once | INTRAMUSCULAR | Status: DC

## 2014-08-08 MED ORDER — PRAVASTATIN SODIUM 40 MG PO TABS: 40.0000 mg | ORAL_TABLET | Freq: Every day | ORAL | Status: DC

## 2014-08-08 NOTE — Patient Instructions (Addendum)
May reduce insulin to 17 if am sugar <80  Pravastatin 40mg  daily  Check blood sugars on waking up ..  .. times a week Also check blood sugars about 2 hours after a meal and do this after different meals by rotation Recommended blood sugar levels on waking up is 90-130 and about 2 hours after meal is 140-180 Please bring blood sugar monitor to each visit.  Call about reoprt of pancreas test

## 2014-08-08 NOTE — Progress Notes (Signed)
Patient ID: Anna Jennings, female   DOB: [birth year], 44 y.o.   MRN: [id number]   Reason for Appointment : Followup for Type 2 Diabetes  History of Present Illness          Diagnosis: Type 2 diabetes mellitus, date of diagnosis: 2007       Past history: She was initially diagnosed with a two-hour postprandial glucose of 218 in 08/2005 and baseline A1c of 5.7 Apparently she was not treated with oral hypoglycemic drugs initially and probably started on metformin in 2009. She did have diarrhea with the regular metformin but tolerated extended release preparation better with no side effects even on 2000 mg She thinks her blood sugars are fairly well controlled initially but a couple of years later with higher readings she was given Amaryl also. The dose of this was gradually increased Her A1c readings have been ranging from 6.5-7.2 in between 2011 and 2013 but no further followup done after 11/2011 She had been out of her medications prior to her initial visit here in 12/14 and her blood sugars were progressively higher.  She had side effects of  bloating and swelling with pioglitazone and her [name] was stopped Because of poor control and A1c of 8.5 in 3/15 she was started on Levemir insulin  Recent history:   INSULIN: Levemir 19 units at bedtime     She has been on 4 drug regimen including Levemir insulin Levemir  dose was increased by herself a few weeks ago when she found her blood sugar to be higher in the mornings and is now taking 2 units more However she has not been seen in follow-up since her A1c was done in 4/16  Current blood sugar patterns and problems identified with management:  She has checked her blood sugars very sporadically and recently they appear to be fairly good most of the time  She has been under a lot of stress because of her father's demise and has been traveling to [place] frequently  Not able to exercise because of her traveling and also may not be consistent on  diet  However her blood sugars overall recently fairly good although her A1c was slightly higher at 6.9 on the last measurement  Does not feel hypoglycemic       Oral hypoglycemic drugs the patient is taking are: Glyxambi 10/5,  metformin ER 2 g    Side effects from medications have been: Diarrhea from regular metformin   Glucose monitoring:  <1  times a day       Glucometer: One Touch.   Recent results: Range 79-151, median 103 with highest reading at bedtime  Hypoglycemia: None  Glycemic control:  Lab Results  Component Value Date   HGBA1C 6.9* 06/16/2014   HGBA1C 6.7* 03/17/2014   HGBA1C 6.4 11/18/2013   Lab Results  Component Value Date   MICROALBUR 0.6 11/18/2013   LDLCALC 111* 06/16/2014   CREATININE 0.64 06/16/2014    Self-care: The diet that the patient has been following is: tries to limit portions, usually has some carbohydrate at every meal     Meals: 2 meals per day.  no breakfast, am snack, sandwich at lunch;  Some rice at meals         Exercise: not Walking  recently Dietician visit: Most recent:2011?                 Compliance with the medical regimen: Good Retinal exam: Most recent: 02/07/13 Weight history:  Wt Readings from Last  3 Encounters:  08/08/14 140 lb (63.504 kg)  03/24/14 139 lb (63.05 kg)  11/25/13 136 lb 9.6 oz (61.961 kg)       Medication List       This list is accurate as of: 08/08/14  4:39 PM.  Always use your most recent med list.               BD PEN NEEDLE NANO U/F 32G X 4 MM Misc  Generic drug:  Insulin Pen Needle  USE ONE PEN NEEDLE PER DAY WITH LEVEMIR     cholecalciferol 1000 UNITS tablet  Commonly known as:  VITAMIN D  Take 1,000 Units by mouth daily. Takes 2 tablets daily     GLYXAMBI 10-5 MG Tabs  Generic drug:  Empagliflozin-Linagliptin  TAKE 1 TABLET BY MOUTH DAILY BEFORE BREAKFAST.     LEVEMIR FLEXTOUCH 100 UNIT/ML Pen  Generic drug:  Insulin Detemir  INJECT 14 UNITS SUBCUTANEOUSLY ONCE A DAY      metFORMIN 500 MG 24 hr tablet  Commonly known as:  GLUCOPHAGE-XR  TAKE 2 TABLETS BY MOUTH TWICE A DAY     pravastatin 20 MG tablet  Commonly known as:  PRAVACHOL  Take 1 tablet (20 mg total) by mouth daily.        Allergies:  Allergies  Allergen Reactions  . Vicodin [Hydrocodone-Acetaminophen] Nausea Only    Past Medical History  Diagnosis Date  . Diabetes mellitus without complication     Past Surgical History  Procedure Laterality Date  . Cholecystectomy    . Appendectomy    . Pancreas surgery      Tumor head of pancreas, incompletely resected    Family History  Problem Relation Age of Onset  . Hyperlipidemia Father   . Hypertension Father   . Coronary artery disease Father   . Diabetes Brother   . Heart disease Paternal Grandfather     Social History:  reports that she has never smoked. She has never used smokeless tobacco. Her alcohol and drug histories are not on file.    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 Was treated with radiation and subsequently her tumor has been stable and asymptomatic. Biopsy showed Adenocarcinoma, Islet Cell type.  Her notes indicate tumor was 6.9 cm on ultrasound in 2012; periodically has had MRI  or other studies done with no growth Report of last scan is not available and she again has not brought her information as requested      Lipids: was on Lipitor  previously when she stopped it because of generalized aches and pains which were significant   She  does have a fairly significant family history of CAD LDL is better with starting pravastatin 20 mg but still over 100, tends to have higher direct LDL  Lab Results  Component Value Date   CHOL 197 06/16/2014   HDL 52.60 06/16/2014   LDLCALC 111* 06/16/2014   LDLDIRECT 134.4 08/19/2013   TRIG 167.0* 06/16/2014   CHOLHDL 4 06/16/2014      She has  had severe vitamin D deficiency diagnosed in 2009 with a level of 5.5. Her level is normal  as of 9/15    No history of hypertension    Physical Examination:  BP 120/78 mmHg  Pulse 92  Temp(Src) 97.9 F (36.6 C)  Resp 14  Ht 4\' 11"  (  1.499 m)  Wt 140 lb (63.504 kg)  BMI 28.26 kg/m2  SpO2 99%    ASSESSMENT/PLAN:   Diabetes type 2 with BMI 27  Her blood sugars are usually  well controlled using basal insulin as well as Glyxambi and metformin   A1c is slightly higher, most likely because of inconsistent exercise, diet and stress Also has not checked blood sugars very often although recently they are excellent Since her fasting glucose recently was only 79 she can reduce her dose down to 17 again on the Levemir She will try to start back on her walking program   Hypercholesterolemia with family history of CAD:   appears to have some improvement but since her LDL is still relatively high will increase her dose to 40 mg   Pancreatic tumor: She will call to let us know if she had an MRI versus CT scan versus ultrasound and  do the same study that was done previously      [facility] 08/08/2014, 4:39 PM

## 2014-09-12 ENCOUNTER — Telehealth: Payer: Self-pay | Admitting: Endocrinology

## 2014-09-12 ENCOUNTER — Other Ambulatory Visit: Payer: Self-pay | Admitting: *Deleted

## 2014-09-12 MED ORDER — GLUCOSE BLOOD VI STRP: ORAL_STRIP | Status: DC

## 2014-09-12 NOTE — Telephone Encounter (Signed)
rx sent

## 2014-09-12 NOTE — Telephone Encounter (Signed)
Patient called and would like a refill on her Rx  Rx: One touch ultra test strips   Pharmacy: [facility]   Thank you

## 2014-09-27 ENCOUNTER — Other Ambulatory Visit: Payer: Self-pay | Admitting: Endocrinology

## 2014-10-27 ENCOUNTER — Other Ambulatory Visit: Payer: Self-pay | Admitting: Endocrinology

## 2014-11-10 ENCOUNTER — Other Ambulatory Visit (INDEPENDENT_AMBULATORY_CARE_PROVIDER_SITE_OTHER): Payer: PRIVATE HEALTH INSURANCE

## 2014-11-10 ENCOUNTER — Other Ambulatory Visit: Payer: PRIVATE HEALTH INSURANCE

## 2014-11-10 DIAGNOSIS — E782 Mixed hyperlipidemia: Secondary | ICD-10-CM

## 2014-11-10 DIAGNOSIS — E1165 Type 2 diabetes mellitus with hyperglycemia: Secondary | ICD-10-CM

## 2014-11-10 DIAGNOSIS — IMO0002 Reserved for concepts with insufficient information to code with codable children: Secondary | ICD-10-CM

## 2014-11-10 LAB — COMPREHENSIVE METABOLIC PANEL
ALBUMIN: 4 g/dL (ref 3.5–5.2)
ALT: 15 U/L (ref 0–35)
AST: 17 U/L (ref 0–37)
Alkaline Phosphatase: 58 U/L (ref 39–117)
BUN: 12 mg/dL (ref 6–23)
CHLORIDE: 106 meq/L (ref 96–112)
CO2: 25 mEq/L (ref 19–32)
Calcium: 8.6 mg/dL (ref 8.4–10.5)
Creatinine, Ser: 0.53 mg/dL (ref 0.40–1.20)
GFR: 133.94 mL/min (ref >60.00)
GLUCOSE: 100 mg/dL — AB (ref 70–99)
POTASSIUM: 3.9 meq/L (ref 3.5–5.1)
SODIUM: 139 meq/L (ref 135–145)
Total Bilirubin: 0.6 mg/dL (ref 0.2–1.2)
Total Protein: 7 g/dL (ref 6.0–8.3)

## 2014-11-10 LAB — LIPID PANEL
CHOLESTEROL: 139 mg/dL (ref 0–200)
HDL: 45.9 mg/dL (ref >39.00)
LDL CALC: 74 mg/dL (ref 0–99)
NonHDL: 93.53
Total CHOL/HDL Ratio: 3
Triglycerides: 98 mg/dL (ref 0.0–149.0)
VLDL: 19.6 mg/dL (ref 0.0–40.0)

## 2014-11-10 LAB — HEMOGLOBIN A1C: HEMOGLOBIN A1C: 6.7 % — AB (ref 4.6–6.5)

## 2014-11-14 ENCOUNTER — Ambulatory Visit (INDEPENDENT_AMBULATORY_CARE_PROVIDER_SITE_OTHER): Payer: PRIVATE HEALTH INSURANCE | Admitting: Endocrinology

## 2014-11-14 VITALS — BP 116/72 | HR 86 | Temp 98.2°F | Resp 14 | Ht 59.0 in | Wt 139.4 lb

## 2014-11-14 DIAGNOSIS — IMO0002 Reserved for concepts with insufficient information to code with codable children: Secondary | ICD-10-CM

## 2014-11-14 DIAGNOSIS — E1165 Type 2 diabetes mellitus with hyperglycemia: Secondary | ICD-10-CM

## 2014-11-14 DIAGNOSIS — E782 Mixed hyperlipidemia: Secondary | ICD-10-CM | POA: Diagnosis not present

## 2014-11-14 DIAGNOSIS — C254 Malignant neoplasm of endocrine pancreas: Secondary | ICD-10-CM

## 2014-11-14 NOTE — Progress Notes (Signed)
Patient ID: Anna Jennings, female   DOB: January 09, 1971, 44 y.o.   MRN: [id number]   Reason for Appointment : Followup for Type 2 Diabetes  History of Present Illness          Diagnosis: Type 2 diabetes mellitus, date of diagnosis: 2007       Past history: She was initially diagnosed with a two-hour postprandial glucose of 218 in 08/2005 and baseline A1c of 5.7 Apparently she was not treated with oral hypoglycemic drugs initially and probably started on metformin in 2009. She did have diarrhea with the regular metformin but tolerated extended release preparation better with no side effects even on 2000 mg She thinks her blood sugars are fairly well controlled initially but a couple of years later with higher readings she was given Amaryl also. The dose of this was gradually increased Her A1c readings have been ranging from 6.5-7.2 in between 2011 and 2013 but no further followup done after 11/2011 She had been out of her medications prior to her initial visit here in 12/14 and her blood sugars were progressively higher.  She had side effects of  bloating and swelling with pioglitazone and her [name] was stopped Because of poor control and A1c of 8.5 in 3/15 she was started on Levemir insulin  Recent history:   INSULIN: Levemir 19 units at bedtime     She has been on 4 drug regimen including Levemir insulin Levemir  dose has been stable recently  Current blood sugar patterns and problems identified with management:  She has checked her blood sugars at irregular times and some during the night also  She has usually fairly good readings in the morning; she had a low reading of 68 which thinks was after a brisk walk  She has sporadic high readings after her evening meal and rarely after lunch  She thinks that some of her significant high readings are from putting a forgetting her evening metformin and Lantus and taking them during the night when she wakes up  However her A1c is not any worse at  6.7  Although she has not lost any weight she is still trying to do as much as possible with diet and exercise        Oral hypoglycemic drugs the patient is taking are: Glyxambi 10/5,  metformin ER 1 g twice a day    Side effects from medications have been: Diarrhea from regular metformin   Glucose monitoring:  <1  times a day       Glucometer: One Touch.   Mean values apply above for all meters except median for One Touch  PRE-MEAL Fasting Lunch Dinner  overnight  Overall  Glucose range:  104, 111   97, 107    137-311    Mean/median:      114    POST-MEAL PC Breakfast PC Lunch PC Dinner  Glucose range:   152-184   104-256   Mean/median:       Hypoglycemia: None  Glycemic control:  Lab Results  Component Value Date   HGBA1C 6.7* 11/10/2014   HGBA1C 6.9* 06/16/2014   HGBA1C 6.7* 03/17/2014   Lab Results  Component Value Date   MICROALBUR 0.6 11/18/2013   LDLCALC 74 11/10/2014   CREATININE 0.53 11/10/2014    Self-care: The diet that the patient has been following is: tries to limit portions, usually has some carbohydrate at every meal     Meals: 2 meals per day.  no breakfast, am snack, sandwich  at lunch;  Some rice at meals         Exercise: Walking 3/7. 3 miles Dietician visit: Most recent:2011?                 Compliance with the medical regimen: Good Retinal exam: Most recent: 02/07/13 Weight history:  Wt Readings from Last 3 Encounters:  11/14/14 139 lb 6.4 oz (63.231 kg)  08/08/14 140 lb (63.504 kg)  03/24/14 139 lb (63.05 kg)       Medication List       This list is accurate as of: 11/14/14 11:59 PM.  Always use your most recent med list.               BD PEN NEEDLE NANO U/F 32G X 4 MM Misc  Generic drug:  Insulin Pen Needle  USE ONE PEN NEEDLE PER DAY WITH LEVEMIR     cholecalciferol 1000 UNITS tablet  Commonly known as:  VITAMIN D  Take 1,000 Units by mouth daily. Takes 2 tablets daily     EPINEPHrine 0.3 mg/0.3 mL Soaj injection  Commonly  known as:  EPI-PEN  Inject 0.3 mLs (0.3 mg total) into the muscle once.     glucose blood test strip  Commonly known as:  ONE TOUCH ULTRA TEST  Use as instructed to check blood sugar once a day dx code E11.65     GLYXAMBI 10-5 MG Tabs  Generic drug:  Empagliflozin-Linagliptin  TAKE 1 TABLET BY MOUTH DAILY BEFORE BREAKFAST.     LEVEMIR FLEXTOUCH 100 UNIT/ML Pen  Generic drug:  Insulin Detemir  INJECT 14 UNITS SUBCUTANEOUSLY ONCE A DAY     metFORMIN 500 MG 24 hr tablet  Commonly known as:  GLUCOPHAGE-XR  TAKE 2 TABLETS BY MOUTH TWICE A DAY     pravastatin 40 MG tablet  Commonly known as:  PRAVACHOL  Take 1 tablet (40 mg total) by mouth daily.        Allergies:  Allergies  Allergen Reactions  . Vicodin [Hydrocodone-Acetaminophen] Nausea Only    Past Medical History  Diagnosis Date  . Diabetes mellitus without complication     Past Surgical History  Procedure Laterality Date  . Cholecystectomy    . Appendectomy    . Pancreas surgery      Tumor head of pancreas, incompletely resected    Family History  Problem Relation Age of Onset  . Hyperlipidemia Father   . Hypertension Father   . Coronary artery disease Father   . Diabetes Brother   . Heart disease Paternal Grandfather     Social History:  reports that she has never smoked. She has never used smokeless tobacco. Her alcohol and drug histories are not on file.    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 Was treated with radiation and subsequently her tumor has been stable and asymptomatic. Biopsy showed Adenocarcinoma, Islet Cell type.  Her records are reviewed again and indicate tumor was 6.9 cm on ultrasound in 2012; periodically has had MRI  or other studies done with no growth between 2012 and 2013 No recent abdominal pain or change in appetite      Lipids: was on Lipitor   previously when she stopped it because of generalized aches and pains which were significant  She  does have a fairly significant family history of CAD LDL is  better with  increasing pravastatin to 40 mg and LDL is at target without any side effects   Lab Results  Component Value Date   CHOL 139 11/10/2014   HDL 45.90 11/10/2014   LDLCALC 74 11/10/2014   LDLDIRECT 134.4 08/19/2013   TRIG 98.0 11/10/2014   CHOLHDL 3 11/10/2014      She has had severe vitamin D deficiency diagnosed in 2009 with a level of 5.5. Her level is normal  as of 9/15    No history of hypertension    Physical Examination:  BP 116/72 mmHg  Pulse 86  Temp(Src) 98.2 F (36.8 C)  Resp 14  Ht 4\' 11"  (1.499 m)  Wt 139 lb 6.4 oz (63.231 kg)  BMI 28.14 kg/m2  SpO2 97%   No pedal edema Diabetic foot exam shows normal monofilament sensation in the toes and plantar surfaces, no skin lesions or ulcers on the feet and normal pedal pulses   ASSESSMENT/PLAN:   Diabetes type 2 with BMI 27  Her blood sugars are usually  well controlled using basal insulin as well as Glyxambi and metformin   A1c is slightly better and she is doing more exercise now Part of her difficulty with control is forgetting to take her evening medications and insulin on time Discussed that she can try to take her metformin altogether in the morning and her evening insulin at suppertime She will adjust her Levemir further if fasting readings go down  To check more readings 2 hours after meals also  Hypercholesterolemia with family history of CAD:    Better controlled with 40 mg of pravastatin and will continue  Pancreatic tumor: She doesn't have any follow-up since 2013 Although she is asymptomatic and had partial treatment she had a relatively large tumor and will need follow-up ultrasound Previous tumor had been visualized fairly well ultrasound Also discussed that she may need to be followed by a gastroenterologist   VITAMIN D  deficiency: We'll need follow-up level   Patient Instructions  Take all 4 Metformin at breakfast  Check blood sugars on waking up .Marland Kitchen 2-3 .Marland Kitchen times a week Also check blood sugars about 2 hours after a meal and do this after different meals by rotation  Recommended blood sugar levels on waking up is 90-130 and about 2 hours after meal is 140-180 Please bring blood sugar monitor to each visit.   Total visit time including review of multiple problems, outside records, labs and on counseling = 25 minutes    KUMAR,AJAY 11/16/2014, 8:52 PM

## 2014-11-14 NOTE — Patient Instructions (Signed)
Take all 4 Metformin at breakfast  Check blood sugars on waking up .Marland Kitchen 2-3 .Marland Kitchen times a week Also check blood sugars about 2 hours after a meal and do this after different meals by rotation  Recommended blood sugar levels on waking up is 90-130 and about 2 hours after meal is 140-180 Please bring blood sugar monitor to each visit.

## 2014-12-01 ENCOUNTER — Ambulatory Visit
Admission: RE | Admit: 2014-12-01 | Discharge: 2014-12-01 | Disposition: A | Discharge status: Home / Self Care | Payer: PRIVATE HEALTH INSURANCE | Source: Ambulatory Visit | Attending: Endocrinology | Admitting: Endocrinology

## 2014-12-01 ENCOUNTER — Telehealth: Payer: Self-pay | Admitting: Endocrinology

## 2014-12-01 DIAGNOSIS — C254 Malignant neoplasm of endocrine pancreas: Secondary | ICD-10-CM

## 2014-12-01 NOTE — Progress Notes (Signed)
Quick Note:  Please let patient know that the Pancreatic tumor is now 5.9 cm, previously reported as 6.9, improved ______

## 2014-12-01 NOTE — Telephone Encounter (Signed)
Juliann Pulse from  [facility] has question about patient ultrasound has question about ultra sound. Phone # [phone]

## 2014-12-05 ENCOUNTER — Encounter: Payer: Self-pay | Admitting: *Deleted

## 2014-12-30 ENCOUNTER — Encounter: Payer: Self-pay | Admitting: Endocrinology

## 2015-01-26 ENCOUNTER — Other Ambulatory Visit: Payer: Self-pay | Admitting: Endocrinology

## 2015-02-09 ENCOUNTER — Other Ambulatory Visit: Payer: PRIVATE HEALTH INSURANCE

## 2015-02-13 ENCOUNTER — Ambulatory Visit: Payer: PRIVATE HEALTH INSURANCE | Admitting: Endocrinology

## 2015-02-13 ENCOUNTER — Other Ambulatory Visit: Payer: PRIVATE HEALTH INSURANCE

## 2015-02-16 ENCOUNTER — Ambulatory Visit (INDEPENDENT_AMBULATORY_CARE_PROVIDER_SITE_OTHER): Payer: PRIVATE HEALTH INSURANCE | Admitting: Endocrinology

## 2015-02-16 ENCOUNTER — Other Ambulatory Visit: Payer: PRIVATE HEALTH INSURANCE

## 2015-02-16 ENCOUNTER — Other Ambulatory Visit (INDEPENDENT_AMBULATORY_CARE_PROVIDER_SITE_OTHER): Payer: PRIVATE HEALTH INSURANCE

## 2015-02-16 ENCOUNTER — Encounter: Payer: Self-pay | Admitting: Endocrinology

## 2015-02-16 VITALS — BP 118/72 | HR 102 | Temp 97.8°F | Resp 14 | Ht 59.0 in | Wt 138.6 lb

## 2015-02-16 DIAGNOSIS — E119 Type 2 diabetes mellitus without complications: Secondary | ICD-10-CM | POA: Diagnosis not present

## 2015-02-16 DIAGNOSIS — E559 Vitamin D deficiency, unspecified: Secondary | ICD-10-CM | POA: Diagnosis not present

## 2015-02-16 DIAGNOSIS — E1165 Type 2 diabetes mellitus with hyperglycemia: Secondary | ICD-10-CM

## 2015-02-16 DIAGNOSIS — E782 Mixed hyperlipidemia: Secondary | ICD-10-CM

## 2015-02-16 DIAGNOSIS — IMO0002 Reserved for concepts with insufficient information to code with codable children: Secondary | ICD-10-CM

## 2015-02-16 LAB — COMPREHENSIVE METABOLIC PANEL
ALT: 10 U/L (ref 0–35)
AST: 15 U/L (ref 0–37)
Albumin: 4.1 g/dL (ref 3.5–5.2)
Alkaline Phosphatase: 55 U/L (ref 39–117)
BILIRUBIN TOTAL: 0.5 mg/dL (ref 0.2–1.2)
BUN: 17 mg/dL (ref 6–23)
CALCIUM: 9.2 mg/dL (ref 8.4–10.5)
CHLORIDE: 104 meq/L (ref 96–112)
CO2: 27 meq/L (ref 19–32)
Creatinine, Ser: 0.6 mg/dL (ref 0.40–1.20)
GFR: 115.93 mL/min (ref >60.00)
Glucose, Bld: 92 mg/dL (ref 70–99)
Potassium: 3.7 mEq/L (ref 3.5–5.1)
Sodium: 139 mEq/L (ref 135–145)
Total Protein: 7.1 g/dL (ref 6.0–8.3)

## 2015-02-16 LAB — HEMOGLOBIN A1C: Hgb A1c MFr Bld: 6.6 % — ABNORMAL HIGH (ref 4.6–6.5)

## 2015-02-16 LAB — MICROALBUMIN / CREATININE URINE RATIO
CREATININE, U: 180.1 mg/dL
MICROALB UR: 2.5 mg/dL — AB (ref 0.0–1.9)
MICROALB/CREAT RATIO: 1.4 mg/g (ref 0.0–30.0)

## 2015-02-16 MED ORDER — GLUCOSE BLOOD VI STRP: ORAL_STRIP | Status: DC

## 2015-02-16 NOTE — Patient Instructions (Addendum)
Metformin 1 in am and 2 in pm  Restart 20mg  Pravastatin  Exercise using music videos   Check blood sugars on waking up 3  times a week Also check blood sugars about 2 hours after a meal and do this after different meals by rotation  Recommended blood sugar levels on waking up is 90-130 and about 2 hours after meal is 130-160  Please bring your blood sugar monitor to each visit, thank you

## 2015-02-16 NOTE — Progress Notes (Signed)
Patient ID: Anna Jennings, female   DOB: 1970-12-13, 44 y.o.   MRN: [id number]   Reason for Appointment : Followup for Type 2 Diabetes  History of Present Illness          Diagnosis: Type 2 diabetes mellitus, date of diagnosis: 2007       Past history: She was initially diagnosed with a two-hour postprandial glucose of 218 in 08/2005 and baseline A1c of 5.7 Apparently she was not treated with oral hypoglycemic drugs initially and probably started on metformin in 2009. She did have diarrhea with the regular metformin but tolerated extended release preparation better with no side effects even on 2000 mg She thinks her blood sugars are fairly well controlled initially but a couple of years later with higher readings she was given Amaryl also. The dose of this was gradually increased Her A1c readings have been ranging from 6.5-7.2 in between 2011 and 2013 but no further followup done after 11/2011 She had been out of her medications prior to her initial visit here in 12/14 and her blood sugars were progressively higher.  She had side effects of  bloating and swelling with pioglitazone and her [name] was stopped Because of poor control and A1c of 8.5 in 3/15 she was started on Levemir insulin  Recent history:   INSULIN: Levemir 24 units at bedtime     She has been on 4 drug regimen including Levemir insulin  Current blood sugar patterns and problems identified with management:   Levemir  dose has been increased by herself because she thought her blood sugars are getting higher at all times  Apparently her test strips are expired and most of her readings are reading 160-190 even though her lab glucose was normal at 92; she did not realize this until today She has not had any hypoglycemia with increasing her insulin   However her A1c is not any worse at 6.6  She has not been able to exercise because of feeling tired in the evenings and that time change  She is generally watching  her diet       Oral hypoglycemic drugs the patient is taking are: Glyxambi 10/5,  metformin ER 1 g twice a day    Side effects from medications have been: Diarrhea from regular metformin   Glucose monitoring:  <1  times a day       Glucometer: One Touch.  Blood sugars falsely high because of expired test strips  Hypoglycemia: None  Glycemic control:   Lab Results  Component Value Date   HGBA1C 6.6* 02/16/2015   HGBA1C 6.7* 11/10/2014   HGBA1C 6.9* 06/16/2014   Lab Results  Component Value Date   MICROALBUR 2.5* 02/16/2015   LDLCALC 74 11/10/2014   CREATININE 0.60 02/16/2015    Self-care: The diet that the patient has been following is: tries to limit portions, usually has some carbohydrate at every meal     Meals: 2 meals per day.  no breakfast, am snack, sandwich at lunch;  Some rice at meals         Exercise: none Dietician visit: Most recent:2011?                 Compliance with the medical regimen: Good   Weight history:  Wt Readings from Last 3 Encounters:  02/16/15 138 lb 9.6 oz (62.869 kg)  11/14/14 139 lb 6.4 oz (63.231 kg)  08/08/14 140 lb (63.504 kg)       Medication List  This list is accurate as of: 02/16/15  4:56 PM.  Always use your most recent med list.               BD PEN NEEDLE NANO U/F 32G X 4 MM Misc  Generic drug:  Insulin Pen Needle  USE ONE PEN NEEDLE PER DAY WITH LEVEMIR     cholecalciferol 1000 UNITS tablet  Commonly known as:  VITAMIN D  Take 1,000 Units by mouth daily. Takes 2 tablets daily     EPINEPHrine 0.3 mg/0.3 mL Soaj injection  Commonly known as:  EPI-PEN  Inject 0.3 mLs (0.3 mg total) into the muscle once.     glucose blood test strip  Commonly known as:  ONE TOUCH ULTRA TEST  Use as instructed to check blood sugar once a day dx code E11.65     GLYXAMBI 10-5 MG Tabs  Generic drug:  Empagliflozin-Linagliptin  TAKE 1 TABLET BY MOUTH DAILY BEFORE BREAKFAST.     ibuprofen 200 MG tablet  Commonly known as:   ADVIL,MOTRIN  Take 200 mg by mouth every 6 (six) hours as needed.     LEVEMIR FLEXTOUCH 100 UNIT/ML Pen  Generic drug:  Insulin Detemir  INJECT 14 UNITS SUBCUTANEOUSLY ONCE A DAY     metFORMIN 500 MG 24 hr tablet  Commonly known as:  GLUCOPHAGE-XR  TAKE 2 TABLETS BY MOUTH TWICE A DAY     pravastatin 40 MG tablet  Commonly known as:  PRAVACHOL  Take 1 tablet (40 mg total) by mouth daily.        Allergies:  Allergies  Allergen Reactions  . Vicodin [Hydrocodone-Acetaminophen] Nausea Only    Past Medical History  Diagnosis Date  . Diabetes mellitus without complication [facility])     Past Surgical History  Procedure Laterality Date  . Cholecystectomy    . Appendectomy    . Pancreas surgery      Tumor head of pancreas, incompletely resected    Family History  Problem Relation Age of Onset  . Hyperlipidemia Father   . Hypertension Father   . Coronary artery disease Father   . Diabetes Brother   . Heart disease Paternal Grandfather     Social History:  reports that she has never smoked. She has never used smokeless tobacco. Her alcohol and drug histories are not on file.    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 Was treated with radiation and subsequently her tumor has been stable and asymptomatic. Biopsy showed Adenocarcinoma, Islet Cell type.  Her records are reviewed again and indicate tumor was 6.9 cm on ultrasound in 2012; periodically has had MRI  or other studies done with no growth between 2012 and 2013 Her ultrasound shows that the tumor is now 5.9 cm only  No recent abdominal pain or change in appetite      Lipids: was on Lipitor  previously when she stopped it because of generalized aches and pains which were significant  She  does have a fairly significant family history of CAD LDL is better with  increasing pravastatin to 40 mg but  she thinks it started causing her muscle aches again and she stopped it without notifying us    Lab Results  Component Value Date   CHOL 139 11/10/2014   HDL 45.90 11/10/2014   LDLCALC 74 11/10/2014   LDLDIRECT 134.4 08/19/2013  TRIG 98.0 11/10/2014   CHOLHDL 3 11/10/2014      She has had severe vitamin D deficiency diagnosed in 2009 with a level of 5.5. Her level is normal  as of 9/15    No history of hypertension   Diabetic foot exam in 9/16 shows normal monofilament sensation in the toes and plantar surfaces, no skin lesions or ulcers on the feet and normal pedal pulses  Blisters on right foot: She is also asking about dry skin on the feet  Physical Examination:  BP 118/72 mmHg  Pulse 102  Temp(Src) 97.8 F (36.6 C)  Resp 14  Ht 4\' 11"  (1.499 m)  Wt 138 lb 9.6 oz (62.869 kg)  BMI 27.98 kg/m2  SpO2 98%    ASSESSMENT/PLAN:   Diabetes type 2 with BMI 27 See history of present illness for detailed discussion of his current management, blood sugar patterns and problems identified  Her blood sugars are usually  well controlled using basal insulin as well as Glyxambi and metformin  Fasting glucose is 92 She has gone up on her insulin thinking her blood sugars were high but these were from expired test strips Surprisingly has not had any hypoglycemia or weight gain with her 5 units increases insulin  For now she will continue the same regimen Reminded her to start exercising indoors for long-term benefits She will get new test strips and review her monitor on the next visit  Hypercholesterolemia with family history of CAD:    Better controlled with 40 mg of pravastatin but she was having myalgia, discussed that she can go back to 20 mg at least for now  Pancreatic tumor: smaller as of last ultrasound  Blisters on feet etc.: She will talk to her PCP about this    Patient Instructions  Metformin 1 in am and 2 in pm  Restart 20mg  Pravastatin  Exercise using  music videos   Check blood sugars on waking up 3  times a week Also check blood sugars about 2 hours after a meal and do this after different meals by rotation  Recommended blood sugar levels on waking up is 90-130 and about 2 hours after meal is 130-160  Please bring your blood sugar monitor to each visit, thank you      Total visit time including review of multiple problems, outside records, labs and on counseling = 25 minutes    Tavien Chestnut 02/16/2015, 4:56 PM

## 2015-03-19 ENCOUNTER — Other Ambulatory Visit: Payer: Self-pay | Admitting: Endocrinology

## 2015-04-22 ENCOUNTER — Other Ambulatory Visit: Payer: Self-pay | Admitting: Endocrinology

## 2015-05-11 ENCOUNTER — Other Ambulatory Visit: Payer: PRIVATE HEALTH INSURANCE

## 2015-05-18 ENCOUNTER — Ambulatory Visit: Payer: PRIVATE HEALTH INSURANCE | Admitting: Endocrinology

## 2015-06-01 ENCOUNTER — Other Ambulatory Visit: Payer: Self-pay | Admitting: Endocrinology

## 2015-06-01 ENCOUNTER — Other Ambulatory Visit: Payer: PRIVATE HEALTH INSURANCE

## 2015-06-15 ENCOUNTER — Other Ambulatory Visit (INDEPENDENT_AMBULATORY_CARE_PROVIDER_SITE_OTHER): Payer: PRIVATE HEALTH INSURANCE

## 2015-06-15 DIAGNOSIS — E119 Type 2 diabetes mellitus without complications: Secondary | ICD-10-CM | POA: Diagnosis not present

## 2015-06-15 DIAGNOSIS — E559 Vitamin D deficiency, unspecified: Secondary | ICD-10-CM

## 2015-06-15 LAB — COMPREHENSIVE METABOLIC PANEL
ALBUMIN: 4.2 g/dL (ref 3.5–5.2)
ALT: 22 U/L (ref 0–35)
AST: 25 U/L (ref 0–37)
Alkaline Phosphatase: 60 U/L (ref 39–117)
BUN: 13 mg/dL (ref 6–23)
CALCIUM: 9 mg/dL (ref 8.4–10.5)
CHLORIDE: 101 meq/L (ref 96–112)
CO2: 26 meq/L (ref 19–32)
CREATININE: 0.55 mg/dL (ref 0.40–1.20)
GFR: 127.98 mL/min (ref >60.00)
Glucose, Bld: 101 mg/dL — ABNORMAL HIGH (ref 70–99)
POTASSIUM: 3.9 meq/L (ref 3.5–5.1)
SODIUM: 136 meq/L (ref 135–145)
TOTAL PROTEIN: 7 g/dL (ref 6.0–8.3)
Total Bilirubin: 0.6 mg/dL (ref 0.2–1.2)

## 2015-06-15 LAB — LIPID PANEL
CHOLESTEROL: 163 mg/dL (ref 0–200)
HDL: 51.8 mg/dL (ref >39.00)
LDL CALC: 93 mg/dL (ref 0–99)
NonHDL: 110.92
TRIGLYCERIDES: 90 mg/dL (ref 0.0–149.0)
Total CHOL/HDL Ratio: 3
VLDL: 18 mg/dL (ref 0.0–40.0)

## 2015-06-15 LAB — VITAMIN D 25 HYDROXY (VIT D DEFICIENCY, FRACTURES): VITD: 21.78 ng/mL — ABNORMAL LOW (ref 30.00–100.00)

## 2015-06-15 LAB — HEMOGLOBIN A1C: Hgb A1c MFr Bld: 7.5 % — ABNORMAL HIGH (ref 4.6–6.5)

## 2015-06-22 ENCOUNTER — Encounter: Payer: Self-pay | Admitting: Endocrinology

## 2015-06-22 ENCOUNTER — Other Ambulatory Visit: Payer: Self-pay | Admitting: *Deleted

## 2015-06-22 ENCOUNTER — Ambulatory Visit (INDEPENDENT_AMBULATORY_CARE_PROVIDER_SITE_OTHER): Payer: PRIVATE HEALTH INSURANCE | Admitting: Endocrinology

## 2015-06-22 VITALS — BP 116/72 | HR 81 | Temp 97.7°F | Resp 14 | Ht 59.0 in | Wt 137.6 lb

## 2015-06-22 DIAGNOSIS — E1165 Type 2 diabetes mellitus with hyperglycemia: Secondary | ICD-10-CM

## 2015-06-22 MED ORDER — INSULIN DEGLUDEC 100 UNIT/ML ~~LOC~~ SOPN: 14.0000 [IU] | PEN_INJECTOR | Freq: Every day | SUBCUTANEOUS | Status: DC

## 2015-06-22 NOTE — Progress Notes (Signed)
Patient ID: Anna Jennings, female   DOB: 03-22-1970, 45 y.o.   MRN: [id number]   Reason for Appointment : Followup for Type 2 Diabetes  History of Present Illness          Diagnosis: Type 2 diabetes mellitus, date of diagnosis: 2007       Past history: She was initially diagnosed with a two-hour postprandial glucose of 218 in 08/2005 and baseline A1c of 5.7 Apparently she was not treated with oral hypoglycemic drugs initially and probably started on metformin in 2009. She did have diarrhea with the regular metformin but tolerated extended release preparation better with no side effects even on 2000 mg She thinks her blood sugars are fairly well controlled initially but a couple of years later with higher readings she was given Amaryl also. The dose of this was gradually increased Her A1c readings have been ranging from 6.5-7.2 in between 2011 and 2013 but no further followup done after 11/2011 She had been out of her medications prior to her initial visit here in 12/14 and her blood sugars were progressively higher.  She had side effects of  bloating and swelling with pioglitazone and her [name] was stopped Because of poor control and A1c of 8.5 in 3/15 she was started on Levemir insulin  Recent history:   INSULIN: Levemir 24 units at bedtime     She has been on 4 drug regimen including Levemir insulin A1c is higher than usual at 7.5  Current management, blood sugar patterns and problems identified with management:   She has not been seen in follow-up since December  She has been very inconsistent with checking her blood sugars and has checked only on 5 days  Her morning sugars are usually high, best reading was 137  She has only one reading after breakfast of 248 and once at lunch at 132  No readings after lunch or dinner  She says because of her allergy she has not been doing any exercise and is usually fatigued in the evening  She will forget to take her metformin  periodically at suppertime, had not told me this before  She has taken only 22 units Levemir instead of 24 as before  Has not been controlling her carbohydrate content consistently because of drinking more juice and sometimes eating more bread or rice       Oral hypoglycemic drugs the patient is taking are: Glyxambi 10/5,  metformin ER 1 g twice a day    Side effects from medications have been: Diarrhea from regular metformin   Glucose monitoring:  <1  times a day       Glucometer: One Touch.  MEDIAN 162   Hypoglycemia: None  Glycemic control:   Lab Results  Component Value Date   HGBA1C 7.5* 06/15/2015   HGBA1C 6.6* 02/16/2015   HGBA1C 6.7* 11/10/2014   Lab Results  Component Value Date   MICROALBUR 2.5* 02/16/2015   LDLCALC 93 06/15/2015   CREATININE 0.55 06/15/2015    Self-care: The diet that the patient has been following is: tries to limit portions, usually has some carbohydrate at every meal     Meals: 2 meals per day.  no breakfast, am snack, sandwich at lunch;  Some rice at meals Occasionally which she thinks tends to raise her sugar for couple of days          Exercise: none Dietician visit: Most recent:2011?  Compliance with the medical regimen: Good   Weight history:  Wt Readings from Last 3 Encounters:  06/22/15 137 lb 9.6 oz (62.415 kg)  02/16/15 138 lb 9.6 oz (62.869 kg)  11/14/14 139 lb 6.4 oz (63.231 kg)       Medication List       This list is accurate as of: 06/22/15  8:38 AM.  Always use your most recent med list.               BD PEN NEEDLE NANO U/F 32G X 4 MM Misc  Generic drug:  Insulin Pen Needle  USE ONE PEN NEEDLE PER DAY WITH LEVEMIR     cholecalciferol 1000 units tablet  Commonly known as:  VITAMIN D  Take 1,000 Units by mouth daily. Takes 2 tablets daily     EPINEPHrine 0.3 mg/0.3 mL Soaj injection  Commonly known as:  EPI-PEN  Inject 0.3 mLs (0.3 mg total) into the muscle once.     glucose blood test strip    Commonly known as:  ONE TOUCH ULTRA TEST  Use as instructed to check blood sugar once a day dx code E11.65     GLYXAMBI 10-5 MG Tabs  Generic drug:  Empagliflozin-Linagliptin  TAKE 1 TABLET BY MOUTH DAILY BEFORE BREAKFAST.     ibuprofen 200 MG tablet  Commonly known as:  ADVIL,MOTRIN  Take 200 mg by mouth every 6 (six) hours as needed.     LEVEMIR FLEXTOUCH 100 UNIT/ML Pen  Generic drug:  Insulin Detemir  INJECT 14 UNITS SUBCUTANEOUSLY ONCE A DAY     metFORMIN 500 MG 24 hr tablet  Commonly known as:  GLUCOPHAGE-XR  TAKE 2 TABLETS BY MOUTH TWICE A DAY     pravastatin 20 MG tablet  Commonly known as:  PRAVACHOL  TAKE 1 TABLET BY MOUTH DAILY.        Allergies:  Allergies  Allergen Reactions  . Vicodin [Hydrocodone-Acetaminophen] Nausea Only    Past Medical History  Diagnosis Date  . Diabetes mellitus without complication [facility])     Past Surgical History  Procedure Laterality Date  . Cholecystectomy    . Appendectomy    . Pancreas surgery      Tumor head of pancreas, incompletely resected    Family History  Problem Relation Age of Onset  . Hyperlipidemia Father   . Hypertension Father   . Coronary artery disease Father   . Diabetes Brother   . Heart disease Paternal Grandfather     Social History:  reports that she has never smoked. She has never used smokeless tobacco. Her alcohol and drug histories are not on file.    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 Was treated with radiation and subsequently her tumor has been stable and asymptomatic. Biopsy showed Adenocarcinoma, Islet Cell type.  Her records are reviewed again and indicate tumor was 6.9 cm on ultrasound in 2012; periodically has had MRI  or other studies done with no growth between 2012 and 2013 Her ultrasound shows that the tumor is now 5.9 cm only  No recent abdominal  pain or change in appetite      Lipids: was on Lipitor  previously when she stopped it because of generalized aches and pains which were significant  She  does have a fairly significant family history of CAD LDL is better  with  increasing pravastatin to 40 mg but she thinks it started causing her muscle aches again and she Has gone back on it with better results     Lab Results  Component Value Date   CHOL 163 06/15/2015   HDL 51.80 06/15/2015   LDLCALC 93 06/15/2015   LDLDIRECT 134.4 08/19/2013   TRIG 90.0 06/15/2015   CHOLHDL 3 06/15/2015      She has had severe vitamin D deficiency diagnosed in 2009 with a level of 5.5. Her level is normal  as of 9/15      Diabetic foot exam in 9/16 shows normal monofilament sensation in the toes and plantar surfaces, no skin lesions or ulcers on the feet and normal pedal pulses   Physical Examination:  BP 116/72 mmHg  Pulse 81  Temp(Src) 97.7 F (36.5 C)  Resp 14  Ht 4\' 11"  (1.499 m)  Wt 137 lb 9.6 oz (62.415 kg)  BMI 27.78 kg/m2  SpO2 98%    ASSESSMENT/PLAN:   Diabetes type 2 with BMI 27 See history of present illness for detailed discussion of his current management, blood sugar patterns and problems identified  Her blood sugars are not well controlled partly because of noncompliance with her medications, diet and lack of exercise She is checking infrequently and none after meals  Recommendations:  Reminded her to start exercising indoors to help insulin sensitivity  Take all metformin tablets in the morning for better compliance, may take 2 midday if not able to take all 4 in the morning  Levemir 24 or 26 to get morning readings in the control.    She can try switching to [place] if this is less expensive, currently she is using the co-pay card for Levemir  New co-pay card for [facility] given  HYPERLIPIDEMIA: Better controlled with taking pravastatin regularly   There are no Patient Instructions on file for this  visit.     Aleta Manternach 06/22/2015, 8:38 AM

## 2015-06-22 NOTE — Patient Instructions (Addendum)
Levemir 24 units daily  Check blood sugars on waking up 3  times a week Also check blood sugars about 2 hours after a meal and do this after different meals by rotation  Recommended blood sugar levels on waking up is 90-130 and about 2 hours after meal is 130-160  Please bring your blood sugar monitor to each visit, thank you  Start exercise on treadmill or workout video  Metformin in am, 4 tabs

## 2015-08-17 ENCOUNTER — Other Ambulatory Visit (INDEPENDENT_AMBULATORY_CARE_PROVIDER_SITE_OTHER): Payer: PRIVATE HEALTH INSURANCE

## 2015-08-17 DIAGNOSIS — E1165 Type 2 diabetes mellitus with hyperglycemia: Secondary | ICD-10-CM

## 2015-08-17 LAB — BASIC METABOLIC PANEL
BUN: 8 mg/dL (ref 6–23)
CALCIUM: 8.6 mg/dL (ref 8.4–10.5)
CHLORIDE: 105 meq/L (ref 96–112)
CO2: 23 meq/L (ref 19–32)
CREATININE: 0.56 mg/dL (ref 0.40–1.20)
GFR: 125.24 mL/min (ref >60.00)
GLUCOSE: 234 mg/dL — AB (ref 70–99)
Potassium: 3.9 mEq/L (ref 3.5–5.1)
Sodium: 138 mEq/L (ref 135–145)

## 2015-08-18 LAB — FRUCTOSAMINE: Fructosamine: 322 umol/L — ABNORMAL HIGH (ref 0–285)

## 2015-08-24 ENCOUNTER — Ambulatory Visit (INDEPENDENT_AMBULATORY_CARE_PROVIDER_SITE_OTHER): Payer: PRIVATE HEALTH INSURANCE | Admitting: Endocrinology

## 2015-08-24 ENCOUNTER — Encounter: Payer: Self-pay | Admitting: Endocrinology

## 2015-08-24 VITALS — BP 112/72 | HR 103 | Ht 59.0 in | Wt 136.0 lb

## 2015-08-24 DIAGNOSIS — E559 Vitamin D deficiency, unspecified: Secondary | ICD-10-CM | POA: Diagnosis not present

## 2015-08-24 DIAGNOSIS — E782 Mixed hyperlipidemia: Secondary | ICD-10-CM | POA: Diagnosis not present

## 2015-08-24 DIAGNOSIS — E1165 Type 2 diabetes mellitus with hyperglycemia: Secondary | ICD-10-CM

## 2015-08-24 MED ORDER — INSULIN ASPART 100 UNIT/ML FLEXPEN: PEN_INJECTOR | SUBCUTANEOUS | Status: DC

## 2015-08-24 NOTE — Patient Instructions (Addendum)
Check blood sugars on waking up  3-4 times a week Also check blood sugars about 2 hours after a meal and do this after different meals by rotation  Recommended blood sugar levels on waking up is 90-130 and about 2 hours after meal is 130-160  Please bring your blood sugar monitor to each visit, thank you  Levemir 34 units adjust based on am sugar patterns  6-8 units Novolog before each meal that has Carbs  Walk daily

## 2015-08-24 NOTE — Progress Notes (Signed)
Patient ID: Anna Jennings, female   DOB: 1971/02/12, 45 y.o.   MRN: [id number]   Reason for Appointment : Followup for Type 2 Diabetes  History of Present Illness          Diagnosis: Type 2 diabetes mellitus, date of diagnosis: 2007       Past history: She was initially diagnosed with a two-hour postprandial glucose of 218 in 08/2005 and baseline A1c of 5.7 Apparently she was not treated with oral hypoglycemic drugs initially and probably started on metformin in 2009. She did have diarrhea with the regular metformin but tolerated extended release preparation better with no side effects even on 2000 mg She thinks her blood sugars are fairly well controlled initially but a couple of years later with higher readings she was given Amaryl also. The dose of this was gradually increased Her A1c readings have been ranging from 6.5-7.2 in between 2011 and 2013 but no further followup done after 11/2011 She had been out of her medications prior to her initial visit here in 12/14 and her blood sugars were progressively higher.  She had side effects of  bloating and swelling with pioglitazone and her [name] was stopped Because of poor control and A1c of 8.5 in 3/15 she was started on Levemir insulin  Recent history:   INSULIN: Levemir 30 units at bedtime    Oral hypoglycemic drugs the patient is taking are: Glyxambi 10/5,  metformin ER 2 g qd  She has been on 4 drug regimen including Levemir insulin A1c is higher than usual at 7.5 in 4/17  Current management, blood sugar patterns and problems identified with management:   She has been very inconsistent with checking her blood sugars because of her busy work schedule  FRUCTOSAMINE indicates poor control  Although she thinks she is having high sugars mostly fasting she has done only one reading after dinner which was over 200.  She has also been inconsistent with watching her carbohydrates which she thinks makes her blood sugar go  up  Since her sugars had been previously high she increase her insulin by 10 units last night and fasting reading was 139 this morning  Lab fasting glucose was also over 200 and her highest reading has been 283 fasting  She does better with compliance using all 4 tablets of metformin in the morning instead of twice a day.         Side effects from medications have been: Diarrhea from regular metformin   Glucose monitoring:  <1  times a day       Glucometer: One Touch.  MEDIAN 162   Hypoglycemia: None  Glycemic control:   Lab Results  Component Value Date   HGBA1C 7.5* 06/15/2015   HGBA1C 6.6* 02/16/2015   HGBA1C 6.7* 11/10/2014   Lab Results  Component Value Date   MICROALBUR 2.5* 02/16/2015   LDLCALC 93 06/15/2015   CREATININE 0.56 08/17/2015    Self-care: The diet that the patient has been following is: tries to limit portions, usually has some carbohydrate at every meal     Meals: 2 meals per day.  no breakfast, am snack, sandwich at lunch;  Some rice at meals Occasionally which she thinks tends to raise her sugar for couple of days          Exercise: rarely Dietician visit: Most recent:2011?                 Compliance with the medical regimen: Good  Weight history:  Wt Readings from Last 3 Encounters:  08/24/15 136 lb (61.689 kg)  06/22/15 137 lb 9.6 oz (62.415 kg)  02/16/15 138 lb 9.6 oz (62.869 kg)    No visits with results within 1 Week(s) from this visit. Latest known visit with results is:  Lab on 08/17/2015  Component Date Value Ref Range Status  . Sodium 08/17/2015 138  135 - 145 mEq/L Final  . Potassium 08/17/2015 3.9  3.5 - 5.1 mEq/L Final  . Chloride 08/17/2015 105  96 - 112 mEq/L Final  . CO2 08/17/2015 23  19 - 32 mEq/L Final  . Glucose, Bld 08/17/2015 234* 70 - 99 mg/dL Final  . BUN 08/17/2015 8  6 - 23 mg/dL Final  . Creatinine, Ser 08/17/2015 0.56  0.40 - 1.20 mg/dL Final  . Calcium 08/17/2015 8.6  8.4 - 10.5 mg/dL Final  . GFR  08/17/2015 125.24  >60.00 mL/min Final  . Fructosamine 08/17/2015 322* 0 - 285 umol/L Final   Comment: Published reference interval for apparently healthy subjects between age 37 and 20 is 75 - 285 umol/L and in a poorly controlled diabetic population is 228 - 563 umol/L with a mean of 396 umol/L.       Medication List       This list is accurate as of: 08/24/15  9:10 AM.  Always use your most recent med list.               BD PEN NEEDLE NANO U/F 32G X 4 MM Misc  Generic drug:  Insulin Pen Needle  USE ONE PEN NEEDLE PER DAY WITH LEVEMIR     cholecalciferol 1000 units tablet  Commonly known as:  VITAMIN D  Take 1,000 Units by mouth daily. Takes 2 tablets daily     EPINEPHrine 0.3 mg/0.3 mL Soaj injection  Commonly known as:  EPI-PEN  Inject 0.3 mLs (0.3 mg total) into the muscle once.     glucose blood test strip  Commonly known as:  ONE TOUCH ULTRA TEST  Use as instructed to check blood sugar once a day dx code E11.65     GLYXAMBI 10-5 MG Tabs  Generic drug:  Empagliflozin-Linagliptin  TAKE 1 TABLET BY MOUTH DAILY BEFORE BREAKFAST.     ibuprofen 200 MG tablet  Commonly known as:  ADVIL,MOTRIN  Take 200 mg by mouth every 6 (six) hours as needed.     Insulin Degludec 100 UNIT/ML Sopn  Commonly known as:  TRESIBA FLEXTOUCH  Inject 14 Units into the skin daily.     insulin detemir 100 UNIT/ML injection  Commonly known as:  LEVEMIR  Inject 30 Units into the skin at bedtime. Finishing this week.     metFORMIN 500 MG 24 hr tablet  Commonly known as:  GLUCOPHAGE-XR  TAKE 2 TABLETS BY MOUTH TWICE A DAY     pravastatin 20 MG tablet  Commonly known as:  PRAVACHOL  TAKE 1 TABLET BY MOUTH DAILY.        Allergies:  Allergies  Allergen Reactions  . Vicodin [Hydrocodone-Acetaminophen] Nausea Only    Past Medical History  Diagnosis Date  . Diabetes mellitus without complication [facility])     Past Surgical History  Procedure Laterality Date  . Cholecystectomy     . Appendectomy    . Pancreas surgery      Tumor head of pancreas, incompletely resected    Family History  Problem Relation Age of Onset  . Hyperlipidemia Father   . Hypertension  Father   . Coronary artery disease Father   . Diabetes Brother   . Heart disease Paternal Grandfather     Social History:  reports that she has never smoked. She has never used smokeless tobacco. Her alcohol and drug histories are not on file.    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 Was treated with radiation and subsequently her tumor has been stable and asymptomatic. Biopsy showed Adenocarcinoma, Islet Cell type.  Her records are reviewed again and indicate tumor was 6.9 cm on ultrasound in 2012; periodically has had MRI  or other studies done with no growth between 2012 and 2013  Her ultrasound shows that the tumor is on the last ultrasound 5.9 cm only  No recent abdominal pain or change in appetite      Lipids: was on Lipitor  previously when she stopped it because of generalized aches and pains which were significant  She  does have a fairly significant family history of CAD LDL is better with  increasing pravastatin to 40 mg but she thinks it started causing her muscle aches again and she Has gone back on it with better results     Lab Results  Component Value Date   CHOL 163 06/15/2015   HDL 51.80 06/15/2015   LDLCALC 93 06/15/2015   LDLDIRECT 134.4 08/19/2013   TRIG 90.0 06/15/2015   CHOLHDL 3 06/15/2015      She has had severe vitamin D deficiency diagnosed in 2009 with a level of 5.5. Her level is normal  as of 9/15      Diabetic foot exam in 9/16 shows normal monofilament sensation in the toes and plantar surfaces, no skin lesions or ulcers on the feet and normal pedal pulses   Physical Examination:  BP 112/72 mmHg  Pulse 103  Ht 4\' 11"  (1.499 m)   Wt 136 lb (61.689 kg)  BMI 27.45 kg/m2  SpO2 93%    ASSESSMENT/PLAN:   Diabetes type 2 with BMI 27 See history of present illness for detailed discussion of his current management, blood sugar patterns and problems identified  Her blood sugars are not well controlled  because of noncompliance with her medications, diet and lack of exercise However does appear to be having some high readings after meals but difficult to determine because of lack of adequate monitoring  His appearing to have progressive insulin deficiency  Recommendations:  Start Novolog with any meal that has carbohydrate especially lunch and dinner.  Discussed mealtime insulin in detail  She will target her postprandial readings to at least 180 or below  Given her a flowsheet to keep a record of her food and insulin before and 2 hours after eating  She will take this to the nurse educator  Keep a steady dose of 34 Levemir unless morning sugars are consistently high  Switched to [place] and Levemir is finished  Also explained to her in detail the options of using the V-go pump much she is reluctant to consider this now  Follow-up in 1 month   Patient Instructions  Check blood sugars on waking up  3-4 times a week Also check blood sugars about 2 hours after a meal and do this after different meals by rotation  Recommended blood sugar levels on waking up is 90-130 and about 2 hours after meal is 130-160  Please  bring your blood sugar monitor to each visit, thank you  Levemir 34 units adjust based on am sugar patterns  6-8 units Novolog before each meal that has Carbs  Walk daily     Counseling time on subjects discussed above is over 50% of today's 25 minute visit   Vanesa Renier 08/24/2015, 9:10 AM

## 2015-08-24 NOTE — Addendum Note (Signed)
Addended by: Verlin Grills T on: 08/24/2015 05:02 PM   Modules accepted: Orders

## 2015-08-27 ENCOUNTER — Other Ambulatory Visit: Payer: Self-pay | Admitting: Endocrinology

## 2015-09-10 ENCOUNTER — Other Ambulatory Visit: Payer: Self-pay | Admitting: Endocrinology

## 2015-09-14 ENCOUNTER — Encounter: Payer: PRIVATE HEALTH INSURANCE | Admitting: Nutrition

## 2015-09-21 ENCOUNTER — Other Ambulatory Visit: Payer: Self-pay | Admitting: Endocrinology

## 2015-09-23 ENCOUNTER — Other Ambulatory Visit: Payer: PRIVATE HEALTH INSURANCE

## 2015-09-28 ENCOUNTER — Other Ambulatory Visit (INDEPENDENT_AMBULATORY_CARE_PROVIDER_SITE_OTHER): Payer: PRIVATE HEALTH INSURANCE

## 2015-09-28 ENCOUNTER — Ambulatory Visit: Payer: PRIVATE HEALTH INSURANCE | Admitting: Endocrinology

## 2015-09-28 DIAGNOSIS — E1165 Type 2 diabetes mellitus with hyperglycemia: Secondary | ICD-10-CM | POA: Diagnosis not present

## 2015-09-28 DIAGNOSIS — E559 Vitamin D deficiency, unspecified: Secondary | ICD-10-CM | POA: Diagnosis not present

## 2015-09-28 LAB — GLUCOSE, RANDOM: Glucose, Bld: 180 mg/dL — ABNORMAL HIGH (ref 70–99)

## 2015-09-28 LAB — VITAMIN D 25 HYDROXY (VIT D DEFICIENCY, FRACTURES): VITD: 26.03 ng/mL — ABNORMAL LOW (ref 30.00–100.00)

## 2015-09-28 LAB — HEMOGLOBIN A1C: HEMOGLOBIN A1C: 6.7 % — AB (ref 4.6–6.5)

## 2015-10-05 ENCOUNTER — Encounter: Payer: Self-pay | Admitting: Endocrinology

## 2015-10-05 ENCOUNTER — Encounter: Payer: PRIVATE HEALTH INSURANCE | Admitting: Nutrition

## 2015-10-05 ENCOUNTER — Ambulatory Visit (INDEPENDENT_AMBULATORY_CARE_PROVIDER_SITE_OTHER): Payer: PRIVATE HEALTH INSURANCE | Admitting: Endocrinology

## 2015-10-05 VITALS — BP 110/80 | HR 102 | Ht 59.0 in | Wt 141.0 lb

## 2015-10-05 DIAGNOSIS — E1165 Type 2 diabetes mellitus with hyperglycemia: Secondary | ICD-10-CM | POA: Diagnosis not present

## 2015-10-05 DIAGNOSIS — E559 Vitamin D deficiency, unspecified: Secondary | ICD-10-CM

## 2015-10-05 NOTE — Patient Instructions (Signed)
Vitmin D3, 2000 units daily  Check blood sugars on waking up    Also check blood sugars about 2 hours after a meal and do this after different meals by rotation  Recommended blood sugar levels on waking up is 90-130 and about 2 hours after meal is 130-160  Please bring your blood sugar monitor to each visit, thank you  Novolog right at meal

## 2015-10-05 NOTE — Progress Notes (Signed)
Patient ID: Anna Jennings, female   DOB: 25-Sep-1970, 45 y.o.   MRN: [id number]   Reason for Appointment : Followup for Type 2 Diabetes  History of Present Illness          Diagnosis: Type 2 diabetes mellitus, date of diagnosis: 2007       Past history: She was initially diagnosed with a two-hour postprandial glucose of 218 in 08/2005 and baseline A1c of 5.7 Apparently she was not treated with oral hypoglycemic drugs initially and probably started on metformin in 2009. She did have diarrhea with the regular metformin but tolerated extended release preparation better with no side effects even on 2000 mg She thinks her blood sugars are fairly well controlled initially but a couple of years later with higher readings she was given Amaryl also. The dose of this was gradually increased Her A1c readings have been ranging from 6.5-7.2 in between 2011 and 2013 but no further followup done after 11/2011 She had been out of her medications prior to her initial visit here in 12/14 and her blood sugars were progressively higher.  She had side effects of  bloating and swelling with pioglitazone and her Anna Jennings was stopped Because of poor control and A1c of 8.5 in 3/15 she was started on Levemir insulin  Recent history:   INSULIN: Tresiba 14 units at bedtime.  Novolog before lunch and dinner:  8-10 Oral hypoglycemic drugs the patient is taking are: Glyxambi 10/5,  metformin ER 2 g qd  A1c  Was higher than usual at 7.5 in 4/17 and now it is down to 6.7  Current management, blood sugar patterns and problems identified with management:   She has been recently taking Novolog for mealtime hyperglycemia since 6/17   Previously was having readings as high as 283   Because of inconsistent and inadequate control of fasting readings she was also switched from Levemir to Anna Jennings   she was told to continue the same dose of Tresiba as Levemir but but by mistake she is taking only 14 units instead of  34   FASTING blood sugars are however much better and generally near target without overnight hypoglycemia   Only rarely will forget her Tresiba at night especially when traveling   she still has difficulty getting consistent control of her blood sugars after evening meal and this is likely to be from more carbohydrates or snacks summary: This may also raise morning sugars the next day  Sometimes has difficulty controlling snacks when she is premenstrual   she is only eating  A small meal like a granola bar at breakfast and blood sugars are usually low normal before lunch ; does not have a protein in the morning usually when going to work   she thinks that when she first started Novolog she was having some headaches and did not feel as well but no problems recently   she also is asking about timing of mealtime insulin and may occasionally not take it right before eating  She does better with compliance using all 4 tablets of metformin in the morning instead of twice a day.  Although her weight is higher she thinks this may be because she is premenstrual now         Side effects from medications have been: Diarrhea from regular metformin   Glucose monitoring:  <1  times a day       Glucometer: One Touch.   Mean values apply above for all meters except  median for One Touch  PRE-MEAL Fasting Lunch Dinner Bedtime Overall  Glucose range:  76-179  70-202   88- 238   Mean/median:  139  89  125  190  125     Hypoglycemia:  Minimal , once had a glucose of 68 after breakfast  Glycemic control:   Lab Results  Component Value Date   HGBA1C 6.7 (H) 09/28/2015   HGBA1C 7.5 (H) 06/15/2015   HGBA1C 6.6 (H) 02/16/2015   Lab Results  Component Value Date   MICROALBUR 2.5 (H) 02/16/2015   LDLCALC 93 06/15/2015   CREATININE 0.56 08/17/2015    Self-care: The diet that the patient has been following is: tries to limit portions, usually has some carbohydrate at every meal     Meals: 2  meals per day.  no breakfast, am snack, sandwich at lunch;  Some rice at meals Occasionally which she thinks tends to raise her sugar for couple of days          Exercise: walking  Dietician visit: Most recent:2011?                 Compliance with the medical regimen: Good   Weight history:  Wt Readings from Last 3 Encounters:  10/05/15 141 lb (64 kg)  08/24/15 136 lb (61.7 kg)  06/22/15 137 lb 9.6 oz (62.4 kg)    No visits with results within 1 Week(s) from this visit.  Latest known visit with results is:  Lab on 09/28/2015  Component Date Value Ref Range Status  . Hgb A1c MFr Bld 09/28/2015 6.7* 4.6 - 6.5 % Final  . Glucose, Bld 09/28/2015 180* 70 - 99 mg/dL Final  . VITD 09/28/2015 26.03* 30.00 - 100.00 ng/mL Final      Medication List       Accurate as of 10/05/15  8:46 PM. Always use your most recent med list.          BD PEN NEEDLE NANO U/F 32G X 4 MM Misc Generic drug:  Insulin Pen Needle USE ONE PEN NEEDLE PER DAY WITH LEVEMIR   BD PEN NEEDLE NANO U/F 32G X 4 MM Misc Generic drug:  Insulin Pen Needle USE ONE PEN NEEDLE PER DAY WITH LEVEMIR   cholecalciferol 1000 units tablet Commonly known as:  VITAMIN D Take 1,000 Units by mouth daily. Takes 2 tablets daily   EPINEPHrine 0.3 mg/0.3 mL Soaj injection Commonly known as:  EPI-PEN Inject 0.3 mLs (0.3 mg total) into the muscle once.   glucose blood test strip Commonly known as:  ONE TOUCH ULTRA TEST Use as instructed to check blood sugar once a day dx code E11.65   GLYXAMBI 10-5 MG Tabs Generic drug:  Empagliflozin-Linagliptin TAKE 1 TABLET BY MOUTH DAILY BEFORE BREAKFAST.   ibuprofen 200 MG tablet Commonly known as:  ADVIL,MOTRIN Take 200 mg by mouth every 6 (six) hours as needed.   insulin aspart 100 UNIT/ML FlexPen Commonly known as:  NOVOLOG FLEXPEN Inject 6-8 units three times per day.   Insulin Degludec 100 UNIT/ML Sopn Commonly known as:  TRESIBA FLEXTOUCH Inject 14 Units into the skin  daily.   metFORMIN 500 MG 24 hr tablet Commonly known as:  GLUCOPHAGE-XR TAKE 2 TABLETS BY MOUTH TWICE A DAY   pravastatin 20 MG tablet Commonly known as:  PRAVACHOL TAKE 1 TABLET BY MOUTH ONCE A DAY       Allergies:  Allergies  Allergen Reactions  . Vicodin [Hydrocodone-Acetaminophen] Nausea Only    Past Medical  History:  Diagnosis Date  . Diabetes mellitus without complication [facility])     Past Surgical History:  Procedure Laterality Date  . APPENDECTOMY    . CHOLECYSTECTOMY    . PANCREAS SURGERY     Tumor head of pancreas, incompletely resected    Family History  Problem Relation Age of Onset  . Hyperlipidemia Father   . Hypertension Father   . Coronary artery disease Father   . Diabetes Brother   . Heart disease Paternal Grandfather     Social History:  reports that she has never smoked. She has never used smokeless tobacco. Her alcohol and drug histories are not on file.    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 Was treated with radiation and subsequently her tumor has been stable and asymptomatic. Biopsy showed Adenocarcinoma, Islet Cell type.  Her records are reviewed again and indicate tumor was 6.9 cm on ultrasound in 2012; periodically has had MRI  or other studies done with no growth between 2012 and 2013  Her ultrasound shows that the tumor is on the last ultrasound 5.9 cm only       Lipids: was on Lipitor  previously when she stopped it because of generalized aches and pains which were significant  She  does have a fairly significant family history of CAD LDL is better with  increasing pravastatin to 40 mg but she thinks it started causing her muscle aches again and she Has gone back on it with better results     Lab Results  Component Value Date   CHOL 163 06/15/2015   HDL 51.80 06/15/2015   LDLCALC 93 06/15/2015    LDLDIRECT 134.4 08/19/2013   TRIG 90.0 06/15/2015   CHOLHDL 3 06/15/2015      She has had severe vitamin D deficiency diagnosed in 2009 with a level of 5.5. Her level is  26 but she is getting only about 1600 units of vitamin D 3 times a weekold to take 2000 units     Diabetic foot exam in 9/16 shows normal monofilament sensation in the toes and plantar surfaces, no skin lesions or ulcers on the feet and normal pedal pulses   Physical Examination:  BP 110/80 (BP Location: Left Arm, Patient Position: Sitting, Cuff Size: Normal)   Pulse (!) 102   Ht 4\' 11"  (1.499 m)   Wt 141 lb (64 kg)   SpO2 98%   BMI 28.48 kg/m     ASSESSMENT/PLAN:   Diabetes type 2 with BMI 27 See history of present illness for detailed discussion of current management, blood sugar patterns and problems identified  Her blood sugars are significantly better with both starting mealtime insulin with NovoLog and switching from Levemir to [place] Surprisingly she is taking much less basal insulin with Tyler Aas, was on 30 units of Levemir previously Taking relatively small doses of Novolog with usually good control She will have some postprandial hyperglycemia based on amount of carbohydrate she has with meals or snacks She has check sugars more often but not enough after meals especially after evening meal  Recommendations:  Start checking blood sugars more often after meals and may not need to do every morning  Take consistent amounts of Tresiba using 14 units unless fasting readings are consistently higher or lower  May take Tresiba with her Novolog at mealtimes in the evenings  She will need NovoLog if she is eating a large carbohydrate breakfast on weekends  May continue to adjust Novolog based on size of meal or carbohydrate intake in the evening  She will try to be more consistent with diet  If not able to estimate her meal size may take NovoLog right after eating  Vitamin D3, 2000 units daily, may  take current regimen every day until finished  Follow-up in 3 months unless blood sugars are not well controlled  Regular walking for exercise   Patient Instructions  Vitmin D3, 2000 units daily  Check blood sugars on waking up    Also check blood sugars about 2 hours after a meal and do this after different meals by rotation  Recommended blood sugar levels on waking up is 90-130 and about 2 hours after meal is 130-160  Please bring your blood sugar monitor to each visit, thank you  Novolog right at meal    Counseling time on subjects discussed above is over 50% of today's 25 minute visit   Anna Jennings 10/05/2015, 8:45 PM

## 2015-10-31 ENCOUNTER — Other Ambulatory Visit: Payer: Self-pay | Admitting: Endocrinology

## 2015-11-16 ENCOUNTER — Encounter: Payer: PRIVATE HEALTH INSURANCE | Admitting: Nutrition

## 2015-11-25 NOTE — Telephone Encounter (Signed)
PATIENT NEED A PRESCRIPTION FOR THE metFORMIN (GLUCOPHAGE-XR) 500 MG 24 hr tablet 120 [place] [address] [place] [phone] (Phone) [phone] (Fax)

## 2015-12-07 ENCOUNTER — Telehealth: Payer: Self-pay | Admitting: Endocrinology

## 2015-12-07 ENCOUNTER — Other Ambulatory Visit: Payer: Self-pay | Admitting: *Deleted

## 2015-12-07 MED ORDER — METFORMIN HCL ER 500 MG PO TB24: 1000.0000 mg | ORAL_TABLET | Freq: Two times a day (BID) | ORAL | 3 refills | Status: DC

## 2015-12-07 NOTE — Telephone Encounter (Signed)
Rx sent for the correct amount.

## 2015-12-07 NOTE — Telephone Encounter (Signed)
Pt called and said that her Metformin script was sent in incorrectly, she takes 4 tablets a day and it was only sent in for 60 tablets.  Please resubmit with correct amount, she is completely out. [place]

## 2015-12-23 ENCOUNTER — Encounter: Payer: Self-pay | Admitting: Endocrinology

## 2015-12-29 ENCOUNTER — Other Ambulatory Visit: Payer: Self-pay | Admitting: Endocrinology

## 2015-12-29 DIAGNOSIS — E1165 Type 2 diabetes mellitus with hyperglycemia: Secondary | ICD-10-CM

## 2015-12-29 DIAGNOSIS — Z79899 Other long term (current) drug therapy: Secondary | ICD-10-CM

## 2016-01-11 ENCOUNTER — Other Ambulatory Visit (INDEPENDENT_AMBULATORY_CARE_PROVIDER_SITE_OTHER): Payer: PRIVATE HEALTH INSURANCE

## 2016-01-11 DIAGNOSIS — Z79899 Other long term (current) drug therapy: Secondary | ICD-10-CM | POA: Diagnosis not present

## 2016-01-11 DIAGNOSIS — E1165 Type 2 diabetes mellitus with hyperglycemia: Secondary | ICD-10-CM

## 2016-01-11 LAB — COMPREHENSIVE METABOLIC PANEL
ALBUMIN: 4 g/dL (ref 3.5–5.2)
ALT: 17 U/L (ref 0–35)
AST: 20 U/L (ref 0–37)
Alkaline Phosphatase: 67 U/L (ref 39–117)
BILIRUBIN TOTAL: 0.5 mg/dL (ref 0.2–1.2)
BUN: 13 mg/dL (ref 6–23)
CALCIUM: 8.8 mg/dL (ref 8.4–10.5)
CHLORIDE: 104 meq/L (ref 96–112)
CO2: 28 meq/L (ref 19–32)
CREATININE: 0.65 mg/dL (ref 0.40–1.20)
GFR: 105.26 mL/min (ref >60.00)
Glucose, Bld: 162 mg/dL — ABNORMAL HIGH (ref 70–99)
Potassium: 4.2 mEq/L (ref 3.5–5.1)
SODIUM: 140 meq/L (ref 135–145)
Total Protein: 6.8 g/dL (ref 6.0–8.3)

## 2016-01-11 LAB — HEMOGLOBIN A1C: Hgb A1c MFr Bld: 6.5 % (ref 4.6–6.5)

## 2016-01-11 LAB — VITAMIN B12: Vitamin B-12: 143 pg/mL — ABNORMAL LOW (ref 211–911)

## 2016-01-16 ENCOUNTER — Other Ambulatory Visit: Payer: Self-pay | Admitting: Endocrinology

## 2016-01-18 ENCOUNTER — Ambulatory Visit (INDEPENDENT_AMBULATORY_CARE_PROVIDER_SITE_OTHER): Payer: PRIVATE HEALTH INSURANCE | Admitting: Endocrinology

## 2016-01-18 ENCOUNTER — Encounter: Payer: Self-pay | Admitting: Endocrinology

## 2016-01-18 VITALS — BP 110/86 | HR 87 | Wt 144.0 lb

## 2016-01-18 DIAGNOSIS — E559 Vitamin D deficiency, unspecified: Secondary | ICD-10-CM | POA: Diagnosis not present

## 2016-01-18 DIAGNOSIS — E538 Deficiency of other specified B group vitamins: Secondary | ICD-10-CM

## 2016-01-18 DIAGNOSIS — Z794 Long term (current) use of insulin: Secondary | ICD-10-CM

## 2016-01-18 DIAGNOSIS — E782 Mixed hyperlipidemia: Secondary | ICD-10-CM | POA: Diagnosis not present

## 2016-01-18 DIAGNOSIS — E1165 Type 2 diabetes mellitus with hyperglycemia: Secondary | ICD-10-CM

## 2016-01-18 MED ORDER — CYANOCOBALAMIN 500 MCG/0.1ML NA SOLN: NASAL | 0 refills | Status: DC

## 2016-01-18 NOTE — Progress Notes (Signed)
Patient ID: Anna Jennings, female   DOB: Feb 29, 1971, 45 y.o.   MRN: [id number]   Reason for Appointment : Followup for Type 2 Diabetes  History of Present Illness          Diagnosis: Type 2 diabetes mellitus, date of diagnosis: 2007       Past history: She was initially diagnosed with a two-hour postprandial glucose of 218 in 08/2005 and baseline A1c of 5.7 Apparently she was not treated with oral hypoglycemic drugs initially and probably started on metformin in 2009. She did have diarrhea with the regular metformin but tolerated extended release preparation better with no side effects even on 2000 mg She thinks her blood sugars are fairly well controlled initially but a couple of years later with higher readings she was given Amaryl also. The dose of this was gradually increased Her A1c readings have been ranging from 6.5-7.2 in between 2011 and 2013 but no further followup done after 11/2011 She had been out of her medications prior to her initial visit here in 12/14 and her blood sugars were progressively higher.  She had side effects of  bloating and swelling with pioglitazone and her [name] was stopped Because of poor control and A1c of 8.5 in 3/15 she was started on Levemir insulin  Recent history:   INSULIN: Tresiba 14 units at bedtime.  Novolog before lunch and dinner: 8-10 units Oral hypoglycemic drugs the patient is taking are: Glyxambi 10/5,  metformin ER 2 g qd  A1c has improved further and is now 6.5  Current management, blood sugar patterns and problems identified with management:   She has not checked her blood sugars after meals and only a few readings in the mornings  FASTING blood sugars appear to be mildly increased  No reported hypoglycemia with mealtime insulin  Although she has continued on Glyxambi she appears to be gaining weight now  Has not been able to be consistent with exercise lately for various reasons  Also has some variable compliance  with diet         Side effects from medications have been: Diarrhea from regular metformin   Glucose monitoring:  <1  times a day       Glucometer: One Touch.   Recent readings: Fasting 137-230 with average 164, afternoon average 144 and bedtime has one reading of 115  Glycemic control:   Lab Results  Component Value Date   HGBA1C 6.5 01/11/2016   HGBA1C 6.7 (H) 09/28/2015   HGBA1C 7.5 (H) 06/15/2015   Lab Results  Component Value Date   MICROALBUR 2.5 (H) 02/16/2015   LDLCALC 93 06/15/2015   CREATININE 0.65 01/11/2016    Self-care: The diet that the patient has been following is: tries to limit portions, usually has some carbohydrate at every meal     Meals: 2 meals per day.  no breakfast, am snack, sandwich at lunch;  Some rice at meals Occasionally which she thinks tends to raise her sugar for couple of days          Exercise: walking less Recently  Dietician visit: Most recent:2011?                 Compliance with the medical regimen: Good   Weight history:  Wt Readings from Last 3 Encounters:  01/18/16 144 lb (65.3 kg)  10/05/15 141 lb (64 kg)  08/24/15 136 lb (61.7 kg)    No visits with results within 1 Week(s) from this visit.  Latest known visit with results is:  Lab on 01/11/2016  Component Date Value Ref Range Status  . Hgb A1c MFr Bld 01/11/2016 6.5  4.6 - 6.5 % Final  . Sodium 01/11/2016 140  135 - 145 mEq/L Final  . Potassium 01/11/2016 4.2  3.5 - 5.1 mEq/L Final  . Chloride 01/11/2016 104  96 - 112 mEq/L Final  . CO2 01/11/2016 28  19 - 32 mEq/L Final  . Glucose, Bld 01/11/2016 162* 70 - 99 mg/dL Final  . BUN 01/11/2016 13  6 - 23 mg/dL Final  . Creatinine, Ser 01/11/2016 0.65  0.40 - 1.20 mg/dL Final  . Total Bilirubin 01/11/2016 0.5  0.2 - 1.2 mg/dL Final  . Alkaline Phosphatase 01/11/2016 67  39 - 117 U/L Final  . AST 01/11/2016 20  0 - 37 U/L Final  . ALT 01/11/2016 17  0 - 35 U/L Final  . Total Protein 01/11/2016 6.8  6.0 - 8.3 g/dL Final    . Albumin 01/11/2016 4.0  3.5 - 5.2 g/dL Final  . Calcium 01/11/2016 8.8  8.4 - 10.5 mg/dL Final  . GFR 01/11/2016 105.26  >60.00 mL/min Final  . Vitamin B-12 01/11/2016 143* 211 - 911 pg/mL Final      Medication List       Accurate as of 01/18/16 11:59 PM. Always use your most recent med list.          BD PEN NEEDLE NANO U/F 32G X 4 MM Misc Generic drug:  Insulin Pen Needle USE ONE PEN NEEDLE PER DAY WITH LEVEMIR   BD PEN NEEDLE NANO U/F 32G X 4 MM Misc Generic drug:  Insulin Pen Needle USE ONE PEN NEEDLE PER DAY WITH LEVEMIR   cholecalciferol 1000 units tablet Commonly known as:  VITAMIN D Take 1,000 Units by mouth daily. Takes 2 tablets daily   Cyanocobalamin 500 MCG/0.1ML Soln Commonly known as:  NASCOBAL 1 spray weekly   EPINEPHrine 0.3 mg/0.3 mL Soaj injection Commonly known as:  EPI-PEN Inject 0.3 mLs (0.3 mg total) into the muscle once.   GLYXAMBI 10-5 MG Tabs Generic drug:  Empagliflozin-Linagliptin TAKE 1 TABLET BY MOUTH DAILY BEFORE BREAKFAST.   ibuprofen 200 MG tablet Commonly known as:  ADVIL,MOTRIN Take 200 mg by mouth every 6 (six) hours as needed.   insulin aspart 100 UNIT/ML FlexPen Commonly known as:  NOVOLOG FLEXPEN Inject 6-8 units three times per day.   insulin degludec 100 UNIT/ML Sopn FlexTouch Pen Commonly known as:  TRESIBA FLEXTOUCH Inject 14 Units into the skin daily.   metFORMIN 500 MG 24 hr tablet Commonly known as:  GLUCOPHAGE-XR Take 2 tablets (1,000 mg total) by mouth 2 (two) times daily.   ONE TOUCH ULTRA TEST test strip Generic drug:  glucose blood USE AS INSTRUCTED TO CHECK BLOOD SUGAR ONCE A DAY   pravastatin 20 MG tablet Commonly known as:  PRAVACHOL TAKE 1 TABLET BY MOUTH ONCE A DAY       Allergies:  Allergies  Allergen Reactions  . Vicodin [Hydrocodone-Acetaminophen] Nausea Only    Past Medical History:  Diagnosis Date  . Diabetes mellitus without complication [facility])     Past Surgical History:   Procedure Laterality Date  . APPENDECTOMY    . CHOLECYSTECTOMY    . PANCREAS SURGERY     Tumor head of pancreas, incompletely resected    Family History  Problem Relation Age of Onset  . Hyperlipidemia Father   . Hypertension Father   . Coronary artery disease  Father   . Diabetes Brother   . Heart disease Paternal Grandfather     Social History:  reports that she has never smoked. She has never used smokeless tobacco. Her alcohol and drug histories are not on file.    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 Was treated with radiation and subsequently her tumor has been stable and asymptomatic. Biopsy showed Adenocarcinoma, Islet Cell type.  Her records are reviewed again and indicate tumor was 6.9 cm on ultrasound in 2012; periodically has had MRI  or other studies done with no growth between 2012 and 2013  Her ultrasound shows that the tumor is on the last ultrasound 5.9 cm only Done in 11/2014  She had an episode of pain and tightness in her central chest in the lower part in radiating to her right side of the abdomen and across for about 3-4 minutes and is going to see a GI surgeon tomorrow      Lipids: was on Lipitor  previously when she stopped it because of generalized aches and pains which were significant  She  does have a fairly significant family history of CAD LDL is better with  pravastatin  40 mg    Lab Results  Component Value Date   CHOL 163 06/15/2015   HDL 51.80 06/15/2015   LDLCALC 93 06/15/2015   LDLDIRECT 134.4 08/19/2013   TRIG 90.0 06/15/2015   CHOLHDL 3 06/15/2015      She has had severe vitamin D deficiency diagnosed in 2009 with a level of 5.5. Her level is Relatively low and she is trying to be more compliant with taking it regularly  She is complaining about pain on the top of her feet and this is more when she is  walking or putting her weight on it especially in the morning when she gets up Does not complain of any burning, tingling and no increased sensitivity except with the blankets at night   Diabetic foot exam in 9/16 shows normal monofilament sensation in the toes and plantar surfaces, no skin lesions or ulcers on the feet and normal pedal pulses . She is complaining of increasing fatigue over the last couple of months. Her B-12 level is low at 143   Physical Examination:  BP 110/86   Pulse 87   Wt 144 lb (65.3 kg)   SpO2 97%   BMI 29.08 kg/m     ASSESSMENT/PLAN:   Diabetes type 2 with BMI 27 See history of present illness for detailed discussion of current management, blood sugar patterns and problems identified  Her blood sugars are significantly better with basal bolus insulin regimen with low doses However she has gained weight partly from increased insulin use and decreased activity recently She needs to check blood sugars more regularly and is doing only rare blood sugars in the afternoon and evening Also fasting readings appear to be mildly increased consistently  Pain in feet: Does not appear to have neuropathy, has likely some mechanical or arthritic issues and needs to see a podiatrist  Episode of right upper quadrant/chest pain: Likely to be esophageal spasm  VITAMIN B12 deficiency: This may be related to use of metformin and may be causing her fatigue  Recommendations:  Start checking blood sugars more often after meals  Increased Tresiba by 2 units at least get Fasting blood sugars under  130 consistently  B-12: She can take nasal B12 weekly as she does not want to add another tablet, information given on the product and prescription sent to her [facility], otherwise can get it directly from manufacturer  Recommend GI evaluation  She will see up with Dr.  Lucille Passy supplementation of the vitamin D with 2000 units  Continue to follow blood  pressure  Follow-up in 3 months unless blood sugars are not well controlled  Regular walking for exercise when she can  She needs to establish with a PCP for multiple general medical issues   Patient Instructions  Check blood sugars on waking up  3x per week  Also check blood sugars about 2 hours after a meal and do this after different meals by rotation  Recommended blood sugar levels on waking up is 90-130 and about 2 hours after meal is 130-160  Please bring your blood sugar monitor to each visit, thank you  Vitamin B 12, 1000ug daily  Tresiba 16 units daily  May need less Novolog if sugars < 130 after meals  [facility]       Total visit time for evaluation and management of multiple problems = 25 minutes today   Jaelah Hauth 01/19/2016, 9:11 AM

## 2016-01-18 NOTE — Patient Instructions (Addendum)
Check blood sugars on waking up  3x per week  Also check blood sugars about 2 hours after a meal and do this after different meals by rotation  Recommended blood sugar levels on waking up is 90-130 and about 2 hours after meal is 130-160  Please bring your blood sugar monitor to each visit, thank you  Vitamin B 12, 1000ug daily  Tresiba 16 units daily  May need less Novolog if sugars < 130 after meals  [facility]

## 2016-01-18 NOTE — Telephone Encounter (Signed)
Dr Heather Roberts [facility]atob[facility] Pan[facility] Surgery   General Surgery would like to see copies of patient ultrasound and scans  Patient appt is at 4:30 today please have them there before her appt . Phone # [phone]  Fax [phone]

## 2016-01-19 DIAGNOSIS — E538 Deficiency of other specified B group vitamins: Secondary | ICD-10-CM | POA: Insufficient documentation

## 2016-04-18 ENCOUNTER — Other Ambulatory Visit (INDEPENDENT_AMBULATORY_CARE_PROVIDER_SITE_OTHER): Payer: PRIVATE HEALTH INSURANCE

## 2016-04-18 DIAGNOSIS — Z794 Long term (current) use of insulin: Secondary | ICD-10-CM | POA: Diagnosis not present

## 2016-04-18 DIAGNOSIS — E559 Vitamin D deficiency, unspecified: Secondary | ICD-10-CM

## 2016-04-18 DIAGNOSIS — E538 Deficiency of other specified B group vitamins: Secondary | ICD-10-CM

## 2016-04-18 DIAGNOSIS — E1165 Type 2 diabetes mellitus with hyperglycemia: Secondary | ICD-10-CM | POA: Diagnosis not present

## 2016-04-18 LAB — COMPREHENSIVE METABOLIC PANEL
ALBUMIN: 3.7 g/dL (ref 3.5–5.2)
ALK PHOS: 96 U/L (ref 39–117)
ALT: 12 U/L (ref 0–35)
AST: 13 U/L (ref 0–37)
BILIRUBIN TOTAL: 0.5 mg/dL (ref 0.2–1.2)
BUN: 9 mg/dL (ref 6–23)
CALCIUM: 9 mg/dL (ref 8.4–10.5)
CHLORIDE: 102 meq/L (ref 96–112)
CO2: 26 mEq/L (ref 19–32)
Creatinine, Ser: 0.51 mg/dL (ref 0.40–1.20)
GFR: 139.08 mL/min (ref >60.00)
Glucose, Bld: 142 mg/dL — ABNORMAL HIGH (ref 70–99)
Potassium: 4.3 mEq/L (ref 3.5–5.1)
Sodium: 137 mEq/L (ref 135–145)
TOTAL PROTEIN: 7.6 g/dL (ref 6.0–8.3)

## 2016-04-18 LAB — HEMOGLOBIN A1C: Hgb A1c MFr Bld: 6.9 % — ABNORMAL HIGH (ref 4.6–6.5)

## 2016-04-18 LAB — VITAMIN B12: VITAMIN B 12: 386 pg/mL (ref 211–911)

## 2016-04-18 LAB — LIPID PANEL
Cholesterol: 146 mg/dL (ref 0–200)
HDL: 37.2 mg/dL — ABNORMAL LOW (ref >39.00)
LDL CALC: 93 mg/dL (ref 0–99)
NONHDL: 109
TRIGLYCERIDES: 80 mg/dL (ref 0.0–149.0)
Total CHOL/HDL Ratio: 4
VLDL: 16 mg/dL (ref 0.0–40.0)

## 2016-04-18 LAB — MICROALBUMIN / CREATININE URINE RATIO
CREATININE, U: 109.4 mg/dL
MICROALB UR: 1.4 mg/dL (ref 0.0–1.9)
Microalb Creat Ratio: 1.3 mg/g (ref 0.0–30.0)

## 2016-04-18 LAB — CBC
HCT: 33.3 % — ABNORMAL LOW (ref 36.0–46.0)
Hemoglobin: 11 g/dL — ABNORMAL LOW (ref 12.0–15.0)
MCHC: 33.1 g/dL (ref 30.0–36.0)
MCV: 81.6 fl (ref 78.0–100.0)
Platelets: 383 10*3/uL (ref 150.0–400.0)
RBC: 4.08 Mil/uL (ref 3.87–5.11)
RDW: 15.2 % (ref 11.5–15.5)
WBC: 6.9 10*3/uL (ref 4.0–10.5)

## 2016-04-18 LAB — VITAMIN D 25 HYDROXY (VIT D DEFICIENCY, FRACTURES): VITD: 16.91 ng/mL — ABNORMAL LOW (ref 30.00–100.00)

## 2016-04-25 ENCOUNTER — Encounter: Payer: Self-pay | Admitting: Endocrinology

## 2016-04-25 ENCOUNTER — Ambulatory Visit (INDEPENDENT_AMBULATORY_CARE_PROVIDER_SITE_OTHER): Payer: PRIVATE HEALTH INSURANCE | Admitting: Endocrinology

## 2016-04-25 VITALS — BP 110/74 | HR 108 | Ht 59.0 in | Wt 126.0 lb

## 2016-04-25 DIAGNOSIS — E1165 Type 2 diabetes mellitus with hyperglycemia: Secondary | ICD-10-CM

## 2016-04-25 DIAGNOSIS — R509 Fever, unspecified: Secondary | ICD-10-CM

## 2016-04-25 DIAGNOSIS — R1011 Right upper quadrant pain: Secondary | ICD-10-CM | POA: Diagnosis not present

## 2016-04-25 DIAGNOSIS — Z794 Long term (current) use of insulin: Secondary | ICD-10-CM

## 2016-04-25 DIAGNOSIS — E538 Deficiency of other specified B group vitamins: Secondary | ICD-10-CM

## 2016-04-25 DIAGNOSIS — E559 Vitamin D deficiency, unspecified: Secondary | ICD-10-CM

## 2016-04-25 NOTE — Patient Instructions (Addendum)
Take 16 Tresiba  Keep am sugar <120  No advil

## 2016-04-25 NOTE — Progress Notes (Signed)
Patient ID: Anna Jennings, female   DOB: 30-Oct-1970, 46 y.o.   MRN: [id number]   Reason for Appointment : Followup for Type 2 Diabetes  History of Present Illness          Diagnosis: Type 2 diabetes mellitus, date of diagnosis: 2007       Past history: She was initially diagnosed with a two-hour postprandial glucose of 218 in 08/2005 and baseline A1c of 5.7 Apparently she was not treated with oral hypoglycemic drugs initially and probably started on metformin in 2009. She did have diarrhea with the regular metformin but tolerated extended release preparation better with no side effects even on 2000 mg She thinks her blood sugars are fairly well controlled initially but a couple of years later with higher readings she was given Amaryl also. The dose of this was gradually increased Her A1c readings have been ranging from 6.5-7.2 in between 2011 and 2013 but no further followup done after 11/2011 She had been out of her medications prior to her initial visit here in 12/14 and her blood sugars were progressively higher.  She had side effects of  bloating and swelling with pioglitazone and her [name] was stopped Because of poor control and A1c of 8.5 in 3/15 she was started on Levemir insulin  Recent history:   INSULIN: Tresiba 14 units at bedtime.  Novolog before meals 1:15 carbohydrate ratio Oral hypoglycemic drugs the patient is taking are: Glyxambi 10/5,  metformin ER 2 g qd  A1c has gone up slightly to 6.9, preously 6.5  Current management, blood sugar patterns and problems identified with management:   She has tried to adjust her mealtime insulin now based on carbohydrate counting as discussed and is usually able to figure out her carbohydrates and looking this up on her phone.  However she has sporadic unusually high or low readings and not clear if she is always able to judge her carbohydrates well or potentially may miss her insulin  Also now because of her abdominal  problem she is eating smaller frequent meals at variable times also  She has only a couple of readings over 200, mostly after her meals about 2 weekends ago, recently has not checked her blood sugar much in the last week or so  FASTING blood sugars appear to be mildly increased overall although has had only occasional good readings near normal  With her GI problems she has lost a significant amount of weight because of decreased appetite overall  Has not been able to be consistent with exercise lately for various reasons        Side effects from medications have been: Diarrhea from regular metformin   Glucose monitoring:  <1  times a day       Glucometer: One Touch.   Mean values apply above for all meters except median for One Touch  PRE-MEAL Fasting Lunch Dinner Bedtime Overall  Glucose range:  110-175  53- 197   162 -261   Mean/median:     142 +/-57   POST-MEAL PC Breakfast PC Lunch PC Dinner  Glucose range:  85- 253  49-211   Mean/median:       Glycemic control: Lab Results  Component Value Date   HGBA1C 6.9 (H) 04/18/2016   HGBA1C 6.5 01/11/2016   HGBA1C 6.7 (H) 09/28/2015   Lab Results  Component Value Date   MICROALBUR 1.4 04/18/2016   LDLCALC 93 04/18/2016   CREATININE 0.51 04/18/2016    Self-care: The diet  that the patient has been following is: tries to limit portions, usually has some carbohydrate at every meal     Meals: 2 meals per day.  no breakfast, am snack, sandwich at lunch;  Some rice at meals Occasionally which she thinks tends to raise her sugar for couple of days          Exercise: walking less Recently  Dietician visit: Most recent:2011?                 Compliance with the medical regimen: Good   Weight history:  Wt Readings from Last 3 Encounters:  04/25/16 126 lb (57.2 kg)  01/18/16 144 lb (65.3 kg)  10/05/15 141 lb (64 kg)    No visits with results within 1 Week(s) from this visit.  Latest known visit with results is:  Lab on  04/18/2016  Component Date Value Ref Range Status  . Hgb A1c MFr Bld 04/18/2016 6.9* 4.6 - 6.5 % Final  . Sodium 04/18/2016 137  135 - 145 mEq/L Final  . Potassium 04/18/2016 4.3  3.5 - 5.1 mEq/L Final  . Chloride 04/18/2016 102  96 - 112 mEq/L Final  . CO2 04/18/2016 26  19 - 32 mEq/L Final  . Glucose, Bld 04/18/2016 142* 70 - 99 mg/dL Final  . BUN 04/18/2016 9  6 - 23 mg/dL Final  . Creatinine, Ser 04/18/2016 0.51  0.40 - 1.20 mg/dL Final  . Total Bilirubin 04/18/2016 0.5  0.2 - 1.2 mg/dL Final  . Alkaline Phosphatase 04/18/2016 96  39 - 117 U/L Final  . AST 04/18/2016 13  0 - 37 U/L Final  . ALT 04/18/2016 12  0 - 35 U/L Final  . Total Protein 04/18/2016 7.6  6.0 - 8.3 g/dL Final  . Albumin 04/18/2016 3.7  3.5 - 5.2 g/dL Final  . Calcium 04/18/2016 9.0  8.4 - 10.5 mg/dL Final  . GFR 04/18/2016 139.08  >60.00 mL/min Final  . Microalb, Ur 04/18/2016 1.4  0.0 - 1.9 mg/dL Final  . Creatinine,U 04/18/2016 109.4  mg/dL Final  . Microalb Creat Ratio 04/18/2016 1.3  0.0 - 30.0 mg/g Final  . Cholesterol 04/18/2016 146  0 - 200 mg/dL Final  . Triglycerides 04/18/2016 80.0  0.0 - 149.0 mg/dL Final  . HDL 04/18/2016 37.20* >39.00 mg/dL Final  . VLDL 04/18/2016 16.0  0.0 - 40.0 mg/dL Final  . LDL Cholesterol 04/18/2016 93  0 - 99 mg/dL Final  . Total CHOL/HDL Ratio 04/18/2016 4   Final  . NonHDL 04/18/2016 109.00   Final  . Vitamin B-12 04/18/2016 386  211 - 911 pg/mL Final  . WBC 04/18/2016 6.9  4.0 - 10.5 K/uL Final  . RBC 04/18/2016 4.08  3.87 - 5.11 Mil/uL Final  . Platelets 04/18/2016 383.0  150.0 - 400.0 K/uL Final  . Hemoglobin 04/18/2016 11.0* 12.0 - 15.0 g/dL Final  . HCT 04/18/2016 33.3* 36.0 - 46.0 % Final  . MCV 04/18/2016 81.6  78.0 - 100.0 fl Final  . MCHC 04/18/2016 33.1  30.0 - 36.0 g/dL Final  . RDW 04/18/2016 15.2  11.5 - 15.5 % Final  . VITD 04/18/2016 16.91* 30.00 - 100.00 ng/mL Final    Allergies as of 04/25/2016      Reactions   Vicodin [hydrocodone-acetaminophen]  Nausea Only      Medication List       Accurate as of 04/25/16 12:55 PM. Always use your most recent med list.  BD PEN NEEDLE NANO U/F 32G X 4 MM Misc Generic drug:  Insulin Pen Needle USE ONE PEN NEEDLE PER DAY WITH LEVEMIR   BD PEN NEEDLE NANO U/F 32G X 4 MM Misc Generic drug:  Insulin Pen Needle USE ONE PEN NEEDLE PER DAY WITH LEVEMIR   cholecalciferol 1000 units tablet Commonly known as:  VITAMIN D Take 1,000 Units by mouth daily. Takes 2 tablets daily   Cyanocobalamin 500 MCG/0.1ML Soln Commonly known as:  NASCOBAL 1 spray weekly   EPINEPHrine 0.3 mg/0.3 mL Soaj injection Commonly known as:  EPI-PEN Inject 0.3 mLs (0.3 mg total) into the muscle once.   GLYXAMBI 10-5 MG Tabs Generic drug:  Empagliflozin-Linagliptin TAKE 1 TABLET BY MOUTH DAILY BEFORE BREAKFAST.   ibuprofen 200 MG tablet Commonly known as:  ADVIL,MOTRIN Take 200 mg by mouth every 6 (six) hours as needed.   insulin aspart 100 UNIT/ML FlexPen Commonly known as:  NOVOLOG FLEXPEN Inject 6-8 units three times per day.   insulin degludec 100 UNIT/ML Sopn FlexTouch Pen Commonly known as:  TRESIBA FLEXTOUCH Inject 14 Units into the skin daily.   metFORMIN 500 MG 24 hr tablet Commonly known as:  GLUCOPHAGE-XR Take 2 tablets (1,000 mg total) by mouth 2 (two) times daily.   ONE TOUCH ULTRA TEST test strip Generic drug:  glucose blood USE AS INSTRUCTED TO CHECK BLOOD SUGAR ONCE A DAY   pravastatin 20 MG tablet Commonly known as:  PRAVACHOL TAKE 1 TABLET BY MOUTH ONCE A DAY       Allergies:  Allergies  Allergen Reactions  . Vicodin [Hydrocodone-Acetaminophen] Nausea Only    Past Medical History:  Diagnosis Date  . Diabetes mellitus without complication [facility])     Past Surgical History:  Procedure Laterality Date  . APPENDECTOMY    . CHOLECYSTECTOMY    . PANCREAS SURGERY     Tumor head of pancreas, incompletely resected    Family History  Problem Relation Age of Onset  .  Hyperlipidemia Father   . Hypertension Father   . Coronary artery disease Father   . Diabetes Brother   . Heart disease Paternal Grandfather     Social History:  reports that she has never smoked. She has never used smokeless tobacco. Her alcohol and drug histories are not on file.    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 Was treated with radiation and subsequently her tumor has been stable and asymptomatic. Biopsy showed Adenocarcinoma, Islet Cell type.  Her records are reviewed again and indicate tumor was 6.9 cm on ultrasound in 2012; periodically has had MRI  or other studies done with no growth between 2012 and 2013  Her ultrasound shows that the tumor is on the last ultrasound 5.9 cm Done in 11/2014 Recent CT scan shows lesion to be 6 cm  She has had recurrent problems with abdominal pain and now is having pain on the right lower rib cage and sometimes in the back in the midline She is still working with her gastroenterologist and surgeon to resolve this  Lipids: was on Lipitor  previously when she stopped it because of generalized aches and pains which were significant  She  does have a fairly significant family history of CAD She was taking pravastatin 20 mg size of her last visit, stopped it in December  More recently she had pains in her legs and feet and she started taking Advil.  However dyspnea caused abdominal pain and she is not taking pravastatin now  LDL is back to normal, probably because of her weight loss  Lab Results  Component Value Date   CHOL 146 04/18/2016   HDL 37.20 (L) 04/18/2016   LDLCALC 93  04/18/2016   LDLDIRECT 134.4 08/19/2013   TRIG 80.0 04/18/2016   CHOLHDL 4 04/18/2016      She has had severe vitamin D deficiency diagnosed in 2009 with a level of 5.5.  Lab Results  Component Value Date   VD25OH 16.91 (L) 04/18/2016   VD25OH 26.03 (L) 09/28/2015   VD25OH 21.78 (L) 06/15/2015   VD25OH 34.97 11/18/2013    She had fatigue and her B12 level was checked Her B-12 level Was low at 143 and now back to normal with taking oral B12   Physical Examination:  BP 110/74   Pulse (!) 108   Ht 4\' 11"  (1.499 m)   Wt 126 lb (57.2 kg)   SpO2 98%   BMI 25.45 kg/m     ASSESSMENT/PLAN:   Diabetes type 2 with BMI 27 See history of present illness for detailed discussion of current management, blood sugar patterns and problems identified  Her blood sugars are overall fairly good with A1c 6.9 However she is having difficulty getting constant control of postprandial readings with variable results, this may be related to her not having consistent monitor protein with her meals and also variable intake She is eating small frequent meals because of her GI problems and may not be getting coverage for every meal and occasionally may be having delayed insulin Have only a few blood sugars to look at for the last week or so Also fasting readings appear to be mildly increased consistently    Episodes of right upper quadrant/chest pain: She will discuss with gastroenterologist  Continued fever: She will discuss with taking an internal medicine if she is able to establish or LC infectious disease  VITAMIN B12 deficiency: This may be related to use of metformin and the level is better now with supplements  Recommendations:  Start checking blood sugars more often regularly including after meals  She will need to have some protein at each meal and try to have better estimate of carbohydrates for each meal, needs to have injections before eating  Increase Tresiba by 2 units at least  get Fasting blood sugars under 120 consistently  B-12: She can continue same regimen  No treatment for hyperlipidemia as yet  Stop taking Advil and may take Tylenol for fever since she has had gastritis previously  Infectious disease consult  Will need to review her vitamin D regimen on her next visit  Regular walking for exercise when she can  She needs to establish with a PCP for multiple general medical issues  Consider using freestyle Libre sensor if covered   Patient Instructions  Take 16 Tresiba  Keep am sugar <120  No advil    Total visit time for evaluation and management of multiple problems = 25 minutes today   Murdock Jellison 04/25/2016, 12:55 PM

## 2016-05-10 ENCOUNTER — Telehealth: Payer: Self-pay | Admitting: Endocrinology

## 2016-05-10 MED ORDER — EMPAGLIFLOZIN-LINAGLIPTIN 10-5 MG PO TABS: 1.0000 | ORAL_TABLET | Freq: Every day | ORAL | 3 refills | Status: DC

## 2016-05-10 NOTE — Telephone Encounter (Signed)
Refill submitted. 

## 2016-05-10 NOTE — Telephone Encounter (Signed)
The [facility] in [place] is requesting a refill on glyxambi

## 2016-05-20 ENCOUNTER — Encounter (HOSPITAL_COMMUNITY): Payer: Self-pay | Admitting: Oncology

## 2016-05-20 ENCOUNTER — Emergency Department (HOSPITAL_COMMUNITY)
Admission: EM | Admit: 2016-05-20 | Discharge: 2016-05-21 | Disposition: A | Discharge status: Other Acute Inpatient Hospital | Payer: PRIVATE HEALTH INSURANCE | Attending: Emergency Medicine | Admitting: Emergency Medicine

## 2016-05-20 DIAGNOSIS — K869 Disease of pancreas, unspecified: Secondary | ICD-10-CM | POA: Diagnosis not present

## 2016-05-20 DIAGNOSIS — E119 Type 2 diabetes mellitus without complications: Secondary | ICD-10-CM | POA: Diagnosis not present

## 2016-05-20 DIAGNOSIS — K8689 Other specified diseases of pancreas: Secondary | ICD-10-CM

## 2016-05-20 DIAGNOSIS — K311 Adult hypertrophic pyloric stenosis: Secondary | ICD-10-CM | POA: Insufficient documentation

## 2016-05-20 DIAGNOSIS — Z794 Long term (current) use of insulin: Secondary | ICD-10-CM | POA: Diagnosis not present

## 2016-05-20 DIAGNOSIS — R109 Unspecified abdominal pain: Secondary | ICD-10-CM | POA: Diagnosis present

## 2016-05-20 DIAGNOSIS — Z0189 Encounter for other specified special examinations: Secondary | ICD-10-CM

## 2016-05-20 LAB — COMPREHENSIVE METABOLIC PANEL
ALK PHOS: 93 U/L (ref 38–126)
ALT: 15 U/L (ref 14–54)
AST: 19 U/L (ref 15–41)
Albumin: 3.6 g/dL (ref 3.5–5.0)
Anion gap: 7 (ref 5–15)
BUN: 14 mg/dL (ref 6–20)
CALCIUM: 8.6 mg/dL — AB (ref 8.9–10.3)
CO2: 24 mmol/L (ref 22–32)
CREATININE: 0.56 mg/dL (ref 0.44–1.00)
Chloride: 106 mmol/L (ref 101–111)
GFR calc Af Amer: >60 mL/min (ref >60)
Glucose, Bld: 163 mg/dL — ABNORMAL HIGH (ref 65–99)
Potassium: 3.6 mmol/L (ref 3.5–5.1)
Sodium: 137 mmol/L (ref 135–145)
TOTAL PROTEIN: 7.8 g/dL (ref 6.5–8.1)
Total Bilirubin: 0.5 mg/dL (ref 0.3–1.2)

## 2016-05-20 LAB — CBG MONITORING, ED: Glucose-Capillary: 108 mg/dL — ABNORMAL HIGH (ref 65–99)

## 2016-05-20 LAB — CBC
HCT: 33.5 % — ABNORMAL LOW (ref 36.0–46.0)
Hemoglobin: 11.1 g/dL — ABNORMAL LOW (ref 12.0–15.0)
MCH: 26.4 pg (ref 26.0–34.0)
MCHC: 33.1 g/dL (ref 30.0–36.0)
MCV: 79.8 fL (ref 78.0–100.0)
Platelets: 278 10*3/uL (ref 150–400)
RBC: 4.2 MIL/uL (ref 3.87–5.11)
RDW: 14.7 % (ref 11.5–15.5)
WBC: 6.9 10*3/uL (ref 4.0–10.5)

## 2016-05-20 LAB — LIPASE, BLOOD: Lipase: 15 U/L (ref 11–51)

## 2016-05-20 MED ORDER — SODIUM CHLORIDE 0.9 % IV BOLUS (SEPSIS)
500.0000 mL | Freq: Once | INTRAVENOUS | Status: AC
  Administered 2016-05-21: 500 mL via INTRAVENOUS

## 2016-05-20 MED ORDER — IOPAMIDOL (ISOVUE-300) INJECTION 61%
INTRAVENOUS | Status: AC
  Filled 2016-05-20: qty 100

## 2016-05-20 NOTE — ED Triage Notes (Signed)
Per pt she has had intermittent N/V since Monday.  Per pt today she took 8 u of novalog after eating however prior to checking her glucose levels.  Pt states her PCP does not have her on a sliding scale, and that she is to take 8-10 U of insulin.  Pt also c/o abdominal pain and bloating.

## 2016-05-20 NOTE — ED Provider Notes (Signed)
[facility] DEPT Provider Note   CSN: [id number] Arrival date & time: 05/20/16  2115     History   Chief Complaint Chief Complaint  Patient presents with  . Abdominal Pain    HPI Anna Jennings is a 46 y.o. female.  HPI Patient presents with abdominal pain. Has had nausea and vomiting for the last week. Today she took some NovoLog insulin without checking her sugar. States she had not been able to eat as much due to nausea vomiting and abdominal pain. States sure sugar then drop down to 50 and eventually down to the 30s. Has improved since then. States she's has more difficulty controlling. States she's not been able to eat as much. States when she eats she can feel it refluxed back up. She has had 2 smaller meals. She has been some swelling in her left upper abdomen 2. She has a previous history of a mass/cyst in this area. States initially told years ago that was a pancreatic adenocarcinoma. Had a Whipple done at that time we'll release she says they were not able to get much of the tumor out. This was done improved. States since then she's been told it was not a tumor since she is still alive. Also still has a mass in there that has since been biopsied. Has had some constipation also. Dull upper abdominal pain. Past Medical History:  Diagnosis Date  . Diabetes mellitus without complication [facility])     Patient Active Problem List   Diagnosis Date Noted  . Vitamin B12 deficiency 01/19/2016  . Mixed hyperlipidemia 05/08/2013  . 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 Hypertriglyceridemia 02/12/2013  . Unspecified vitamin D deficiency 02/12/2013  . Type II or unspecified type diabetes mellitus without mention of complication, uncontrolled 02/11/2013    Past Surgical History:  Procedure Laterality Date  . APPENDECTOMY    . CHOLECYSTECTOMY    . PANCREAS SURGERY     Tumor head of pancreas, incompletely resected    OB History    No data available       Home  Medications    Prior to Admission medications   Medication Sig Start Date End Date Taking? Authorizing Provider  BD PEN NEEDLE NANO U/F 32G X 4 MM MISC USE ONE PEN NEEDLE PER DAY WITH LEVEMIR 09/10/15  Yes Elayne Snare, MD  Empagliflozin-Linagliptin (GLYXAMBI) 10-5 MG TABS Take 1 tablet by mouth daily. 05/10/16  Yes Elayne Snare, MD  EPINEPHrine 0.3 mg/0.3 mL IJ SOAJ injection Inject 0.3 mLs (0.3 mg total) into the muscle once. 08/08/14  Yes Elayne Snare, MD  ibuprofen (ADVIL,MOTRIN) 200 MG tablet Take 400 mg by mouth every 6 (six) hours as needed for headache, mild pain or moderate pain.    Yes Historical Provider, MD  insulin aspart (NOVOLOG FLEXPEN) 100 UNIT/ML FlexPen Inject 6-8 units three times per day. Patient taking differently: Inject 8-10 Units into the skin 3 (three) times daily with meals. Inject 6-8 units three times per day. Per sliding scale 08/24/15  Yes Elayne Snare, MD  Insulin Degludec (TRESIBA FLEXTOUCH) 100 UNIT/ML SOPN Inject 14 Units into the skin daily. Patient taking differently: Inject 16 Units into the skin every evening.  06/22/15  Yes Elayne Snare, MD  metFORMIN (GLUCOPHAGE-XR) 500 MG 24 hr tablet Take 2 tablets (1,000 mg total) by mouth 2 (two) times daily. 12/07/15  Yes Elayne Snare, MD  omeprazole (PRILOSEC) 40 MG capsule Take 40 mg by mouth 2 (two) times daily.   Yes Historical Provider, MD  ONE TOUCH ULTRA TEST test strip USE AS INSTRUCTED TO CHECK BLOOD SUGAR ONCE A DAY 01/18/16  Yes Elayne Snare, MD  Cyanocobalamin (NASCOBAL) 500 MCG/0.1ML SOLN 1 spray weekly Patient not taking: Reported on 05/20/2016 01/18/16   Elayne Snare, MD  pravastatin (PRAVACHOL) 20 MG tablet TAKE 1 TABLET BY MOUTH ONCE A DAY Patient not taking: Reported on 04/25/2016 01/18/16   Elayne Snare, MD    Family History Family History  Problem Relation Age of Onset  . Hyperlipidemia Father   . Hypertension Father   . Coronary artery disease Father   . Diabetes Brother   . Heart disease Paternal Grandfather      Social History Social History  Substance Use Topics  . Smoking status: Never Smoker  . Smokeless tobacco: Never Used  . Alcohol use Yes     Comment: Socially     Allergies   Vicodin [hydrocodone-acetaminophen]   Review of Systems Review of Systems  Constitutional: Positive for appetite change. Negative for unexpected weight change.  HENT: Negative for congestion.   Respiratory: Negative for shortness of breath.   Gastrointestinal: Positive for abdominal pain, constipation, nausea and vomiting.  Genitourinary: Negative for dyspareunia.  Musculoskeletal: Negative for back pain.  Skin: Negative for color change.  Neurological: Negative for tremors and seizures.  Hematological: Negative for adenopathy.  Psychiatric/Behavioral: Positive for confusion.     Physical Exam Updated Vital Signs BP (!) 144/90 (BP Location: Left Arm)   Pulse 90   Temp 98.8 F (37.1 C) (Oral)   Resp 18   Ht 4\' 10"  (1.473 m)   Wt 125 lb (56.7 kg)   LMP 05/16/2016 (Approximate) Comment: Negative pregnancy test result 05-20-16  SpO2 100%   BMI 26.13 kg/m   Physical Exam  Constitutional: She appears well-developed.  HENT:  Head: Atraumatic.  Eyes: EOM are normal.  Neck: Neck supple.  Cardiovascular: Normal rate.   Pulmonary/Chest: Effort normal.  Abdominal: She exhibits mass.  Hollow sounding left upper quadrant mass. No swelling in the area. No rebound or guarding. No hernias palpated. Scars from previous surgery upper abdomen.  Musculoskeletal: She exhibits no edema.  Neurological: She is alert.  Skin: Skin is warm. Capillary refill takes less than 2 seconds.     ED Treatments / Results  Labs (all labs ordered are listed, but only abnormal results are displayed) Labs Reviewed  COMPREHENSIVE METABOLIC PANEL - Abnormal; Notable for the following:       Result Value   Glucose, Bld 163 (*)    Calcium 8.6 (*)    All other components within normal limits  CBC - Abnormal; Notable for  the following:    Hemoglobin 11.1 (*)    HCT 33.5 (*)    All other components within normal limits  URINALYSIS, ROUTINE W REFLEX MICROSCOPIC - Abnormal; Notable for the following:    Specific Gravity, Urine 1.038 (*)    Glucose, UA >=500 (*)    Hgb urine dipstick MODERATE (*)    Ketones, ur 20 (*)    Squamous Epithelial / LPF 0-5 (*)    All other components within normal limits  CBG MONITORING, ED - Abnormal; Notable for the following:    Glucose-Capillary 108 (*)    All other components within normal limits  LIPASE, BLOOD  POC URINE PREG, ED    EKG  EKG Interpretation None       Radiology Ct Abdomen Pelvis W Contrast  Result Date: 05/21/2016 CLINICAL DATA:  46 year old female with history of pancreatic  mass (islet cell tumor) and partial resection of pancreas presenting with abdominal pain. Nausea vomiting. EXAM: CT ABDOMEN AND PELVIS WITH CONTRAST TECHNIQUE: Multidetector CT imaging of the abdomen and pelvis was performed using the standard protocol following bolus administration of intravenous contrast. CONTRAST:  11mL ISOVUE-300 IOPAMIDOL (ISOVUE-300) INJECTION 61% COMPARISON:  Abdominal ultrasound dated 12/01/2014 FINDINGS: Lower chest: The visualized lung bases are clear. No intra-abdominal free air or free fluid. Hepatobiliary: Faint subcentimeter low attenuating focus in the left lobe of the liver (series 2, image 17) is not well characterized. There is mild periportal edema. Cholecystectomy. Pancreas: There is postsurgical changes of the pancreas. The body and tail of the pancreas are are not visualized, possibly surgically absent or atrophic. There is a 2.9 x 4.9 cm complex mass or collection in the region of the head and uncinate process of the pancreas. This collection contains low attenuating content with small pockets of air as well as curvilinear high attenuating material. Findings may represent postsurgical changes with packing and surgical material versus necrotic an  infected debris. There is associated inflammatory changes. Spleen: Normal in size without focal abnormality. Adrenals/Urinary Tract: The adrenal glands are unremarkable. There is no hydronephrosis on either side. There is symmetric uptake and excretion of contrast by kidneys bilaterally. The visualized ureters and urinary bladder appear unremarkable. Stomach/Bowel: There is severe distention of the stomach with oral content. There is inflammatory changes and thickening of the distal stomach with associated high-grade luminal narrowing of the gastric antrum and pylorus secondary to mass effect and inflammatory changes of the mass/collection of the head of the pancreas. Moderate amount of stool noted throughout the colon. There is no evidence of small-bowel obstruction. Normal appendix. Vascular/Lymphatic: The abdominal aorta and IVC appear unremarkable. The origins of the celiac axis, SMA, IMA appear patent. There is mild mass effect and narrowing of the porta splenic confluence as well as mild narrowing of the proximal portion of the main portal vein. The SMV, splenic vein, and main portal vein however remain patent. There is no adenopathy. Reproductive: The uterus is retroverted. An intrauterine device is noted. Evaluation for the IUD positioning is somewhat limited on CT. The ovaries are grossly unremarkable. Other: Small fat containing umbilical hernia. Musculoskeletal: No acute or significant osseous findings. IMPRESSION: 1. Complex an inflamed mass/collection at the head of the pancreas as described may represent combination of postsurgical changes and necrotic tissue. Correlation with clinical exam and surgical history recommended. There is associated reactive inflammation and mass effect on the gastric antrum and pylorus with luminal narrowing of the distal stomach and a degree of gastric outlet obstruction. The stomach is distended with oral content. Patient may benefit from placement of an NG tube. 2. Mild  narrowing of the portasplenic confluence bite inflammatory changes of the head of the pancreas. 3. Subcentimeter left hepatic hypodense lesion is not well characterized. 4. No adenopathy lumbar 5 moderate colonic stool burden. No small bowel obstruction. Normal appendix. Electronically Signed   By: Anner Crete M.D.   On: 05/21/2016 00:39    Procedures Procedures (including critical care time)  Medications Ordered in ED Medications  iopamidol (ISOVUE-300) 61 % injection (not administered)  sodium chloride 0.9 % bolus 500 mL (500 mLs Intravenous New Bag/Given 05/21/16 0004)  iopamidol (ISOVUE-300) 61 % injection 100 mL (100 mLs Intravenous Contrast Given 05/21/16 0010)     Initial Impression / Assessment and Plan / ED Course  I have reviewed the triage vital signs and the nursing notes.  Pertinent labs &  imaging results that were available during my care of the patient were reviewed by me and considered in my medical decision making (see chart for details).     Patient with nausea vomiting abdominal distention. Had hypoglycemia due to this. Has gastric outlet obstruction due to known pancreatic mass. Will discuss with Dr.Seshadri at [facility] for possible transfer.   Final Clinical Impressions(s) / ED Diagnoses   Final diagnoses:  Gastric outlet obstruction  Pancreatic mass    New Prescriptions New Prescriptions   No medications on file     Davonna Belling, MD 05/21/16 [phone]

## 2016-05-21 ENCOUNTER — Emergency Department (HOSPITAL_COMMUNITY): Payer: PRIVATE HEALTH INSURANCE

## 2016-05-21 ENCOUNTER — Encounter (HOSPITAL_COMMUNITY): Payer: Self-pay | Admitting: Radiology

## 2016-05-21 DIAGNOSIS — R109 Unspecified abdominal pain: Secondary | ICD-10-CM | POA: Diagnosis present

## 2016-05-21 DIAGNOSIS — K869 Disease of pancreas, unspecified: Secondary | ICD-10-CM | POA: Diagnosis not present

## 2016-05-21 DIAGNOSIS — K311 Adult hypertrophic pyloric stenosis: Secondary | ICD-10-CM | POA: Diagnosis not present

## 2016-05-21 DIAGNOSIS — E119 Type 2 diabetes mellitus without complications: Secondary | ICD-10-CM | POA: Diagnosis not present

## 2016-05-21 DIAGNOSIS — Z794 Long term (current) use of insulin: Secondary | ICD-10-CM | POA: Diagnosis not present

## 2016-05-21 LAB — URINALYSIS, ROUTINE W REFLEX MICROSCOPIC
BACTERIA UA: NONE SEEN
Bilirubin Urine: NEGATIVE
KETONES UR: 20 mg/dL — AB
Leukocytes, UA: NEGATIVE
Nitrite: NEGATIVE
PROTEIN: NEGATIVE mg/dL
SPECIFIC GRAVITY, URINE: 1.038 — AB (ref 1.005–1.030)
pH: 5 (ref 5.0–8.0)

## 2016-05-21 LAB — POC URINE PREG, ED: Preg Test, Ur: NEGATIVE

## 2016-05-21 MED ORDER — IOPAMIDOL (ISOVUE-300) INJECTION 61%
100.0000 mL | Freq: Once | INTRAVENOUS | Status: AC | PRN
  Administered 2016-05-21: 100 mL via INTRAVENOUS

## 2016-05-21 NOTE — ED Notes (Signed)
Pt reports constipation and abd pain x 1 week.  Reports LBM was yesterday and 5 days before that.  At present, pt denies any abd pain or nausea.

## 2016-05-21 NOTE — ED Notes (Signed)
Pt ambulated to the BR with steady gait.   

## 2016-07-04 ENCOUNTER — Other Ambulatory Visit (INDEPENDENT_AMBULATORY_CARE_PROVIDER_SITE_OTHER): Payer: PRIVATE HEALTH INSURANCE

## 2016-07-04 DIAGNOSIS — Z794 Long term (current) use of insulin: Secondary | ICD-10-CM

## 2016-07-04 DIAGNOSIS — E1165 Type 2 diabetes mellitus with hyperglycemia: Secondary | ICD-10-CM | POA: Diagnosis not present

## 2016-07-04 LAB — COMPREHENSIVE METABOLIC PANEL
ALT: 12 U/L (ref 0–35)
AST: 15 U/L (ref 0–37)
Albumin: 4 g/dL (ref 3.5–5.2)
Alkaline Phosphatase: 98 U/L (ref 39–117)
BUN: 10 mg/dL (ref 6–23)
CO2: 27 mEq/L (ref 19–32)
Calcium: 9.3 mg/dL (ref 8.4–10.5)
Chloride: 104 mEq/L (ref 96–112)
Creatinine, Ser: 0.5 mg/dL (ref 0.40–1.20)
GFR: 142.16 mL/min (ref >60.00)
GLUCOSE: 183 mg/dL — AB (ref 70–99)
POTASSIUM: 3.9 meq/L (ref 3.5–5.1)
Sodium: 139 mEq/L (ref 135–145)
TOTAL PROTEIN: 7.7 g/dL (ref 6.0–8.3)
Total Bilirubin: 0.3 mg/dL (ref 0.2–1.2)

## 2016-07-04 LAB — HEMOGLOBIN A1C: HEMOGLOBIN A1C: 6.9 % — AB (ref 4.6–6.5)

## 2016-07-11 ENCOUNTER — Other Ambulatory Visit: Payer: Self-pay

## 2016-07-11 ENCOUNTER — Other Ambulatory Visit: Payer: Self-pay | Admitting: Endocrinology

## 2016-07-11 ENCOUNTER — Encounter: Payer: Self-pay | Admitting: Endocrinology

## 2016-07-11 ENCOUNTER — Ambulatory Visit (INDEPENDENT_AMBULATORY_CARE_PROVIDER_SITE_OTHER): Payer: PRIVATE HEALTH INSURANCE | Admitting: Endocrinology

## 2016-07-11 VITALS — BP 114/80 | HR 97 | Ht 59.0 in | Wt 114.4 lb

## 2016-07-11 DIAGNOSIS — E1165 Type 2 diabetes mellitus with hyperglycemia: Secondary | ICD-10-CM | POA: Diagnosis not present

## 2016-07-11 DIAGNOSIS — Z794 Long term (current) use of insulin: Secondary | ICD-10-CM | POA: Diagnosis not present

## 2016-07-11 MED ORDER — EMPAGLIFLOZIN-LINAGLIPTIN 10-5 MG PO TABS: 1.0000 | ORAL_TABLET | Freq: Every day | ORAL | 3 refills | Status: DC

## 2016-07-11 MED ORDER — INSULIN DEGLUDEC 100 UNIT/ML ~~LOC~~ SOPN: 14.0000 [IU] | PEN_INJECTOR | Freq: Every day | SUBCUTANEOUS | 2 refills | Status: DC

## 2016-07-11 MED ORDER — GLUCOSE BLOOD VI STRP: ORAL_STRIP | 2 refills | Status: DC

## 2016-07-11 NOTE — Progress Notes (Signed)
Patient ID: Anna Jennings, female   DOB: 1971-02-04, 46 y.o.   MRN: [id number]   Reason for Appointment : Followup for Type 2 Diabetes  History of Present Illness          Diagnosis: Type 2 diabetes mellitus, date of diagnosis: 2007       Past history: She was initially diagnosed with a two-hour postprandial glucose of 218 in 08/2005 and baseline A1c of 5.7 Apparently she was not treated with oral hypoglycemic drugs initially and probably started on metformin in 2009. She did have diarrhea with the regular metformin but tolerated extended release preparation better with no side effects even on 2000 mg She thinks her blood sugars are fairly well controlled initially but a couple of years later with higher readings she was given Amaryl also. The dose of this was gradually increased Her A1c readings have been ranging from 6.5-7.2 in between 2011 and 2013 but no further followup done after 11/2011 She had been out of her medications prior to her initial visit here in 12/14 and her blood sugars were progressively higher.  She had side effects of  bloating and swelling with pioglitazone and her [name] was stopped Because of poor control and A1c of 8.5 in 3/15 she was started on Levemir insulin  Recent history:   INSULIN: Tresiba 14 units at bedtime.  Novolog none now, previously taking before meals 1:15 carbohydrate ratio  Oral hypoglycemic drugs the patient is taking are: Glyxambi 10/5,  metformin ER 2 g qd  A1c is again 6.9  Current management, blood sugar patterns and problems identified with management:   She has lost a lot of weight because of her recent pancreatic cyst and needing to be hospitalized  She thinks she is checking her blood sugars regularly but her monitor does not have any readings last 3 weeks and she does not know of this with the right monitor  She was placed on a low carbohydrate diet after her surgery and discharge but only recently she has started eating  some carbohydrate  She does not know what her blood sugars are after meals usually  Also she was off her oral diabetes medicines and went back on these only about 2 weeks ago  She thinks her fasting glucoses have been averaging about 140+  Lab glucose after breakfast was 183        Side effects from medications have been: Diarrhea from regular metformin   Glucose monitoring:  <1  times a day       Glucometer: One Touch ultra 2 .   Mean values apply above for all meters except median for One Touch  PRE-MEAL Fasting Lunch Dinner Bedtime Overall  Glucose range: 110-140      Mean/median:           Glycemic control: Lab Results  Component Value Date   HGBA1C 6.9 (H) 07/04/2016   HGBA1C 6.9 (H) 04/18/2016   HGBA1C 6.5 01/11/2016   Lab Results  Component Value Date   MICROALBUR 1.4 04/18/2016   LDLCALC 93 04/18/2016   CREATININE 0.50 07/04/2016    Self-care: The diet that the patient has been following is: tries to limit portions, usually has some carbohydrate at every meal     Meals: 2 meals per day.  no breakfast, am snack, sandwich at lunch;  Some rice at meals Occasionally which she thinks tends to raise her sugar for couple of days  Exercise: walking a little, Limited by fatigue recently  Dietician visit: Most recent:2011?                 Compliance with the medical regimen: Good   Weight history:  Wt Readings from Last 3 Encounters:  07/11/16 114 lb 6.4 oz (51.9 kg)  05/20/16 125 lb (56.7 kg)  04/25/16 126 lb (57.2 kg)    No visits with results within 1 Week(s) from this visit.  Latest known visit with results is:  Lab on 07/04/2016  Component Date Value Ref Range Status  . Hgb A1c MFr Bld 07/04/2016 6.9* 4.6 - 6.5 % Final   Glycemic Control Guidelines for People with Diabetes:Non Diabetic:  <6%Goal of Therapy: <7%Additional Action Suggested:  >8%   . Sodium 07/04/2016 139  135 - 145 mEq/L Final  . Potassium 07/04/2016 3.9  3.5 - 5.1 mEq/L Final  .  Chloride 07/04/2016 104  96 - 112 mEq/L Final  . CO2 07/04/2016 27  19 - 32 mEq/L Final  . Glucose, Bld 07/04/2016 183* 70 - 99 mg/dL Final  . BUN 07/04/2016 10  6 - 23 mg/dL Final  . Creatinine, Ser 07/04/2016 0.50  0.40 - 1.20 mg/dL Final  . Total Bilirubin 07/04/2016 0.3  0.2 - 1.2 mg/dL Final  . Alkaline Phosphatase 07/04/2016 98  39 - 117 U/L Final  . AST 07/04/2016 15  0 - 37 U/L Final  . ALT 07/04/2016 12  0 - 35 U/L Final  . Total Protein 07/04/2016 7.7  6.0 - 8.3 g/dL Final  . Albumin 07/04/2016 4.0  3.5 - 5.2 g/dL Final  . Calcium 07/04/2016 9.3  8.4 - 10.5 mg/dL Final  . GFR 07/04/2016 142.16  >60.00 mL/min Final    Allergies as of 07/11/2016      Reactions   Vicodin [hydrocodone-acetaminophen] Nausea Only      Medication List       Accurate as of 07/11/16  9:52 AM. Always use your most recent med list.          BD PEN NEEDLE NANO U/F 32G X 4 MM Misc Generic drug:  Insulin Pen Needle USE ONE PEN NEEDLE PER DAY WITH LEVEMIR   Empagliflozin-Linagliptin 10-5 MG Tabs Commonly known as:  GLYXAMBI Take 1 tablet by mouth daily.   EPINEPHrine 0.3 mg/0.3 mL Soaj injection Commonly known as:  EPI-PEN Inject 0.3 mLs (0.3 mg total) into the muscle once.   ibuprofen 200 MG tablet Commonly known as:  ADVIL,MOTRIN Take 400 mg by mouth every 6 (six) hours as needed for headache, mild pain or moderate pain.   insulin aspart 100 UNIT/ML FlexPen Commonly known as:  NOVOLOG FLEXPEN Inject 6-8 units three times per day.   insulin degludec 100 UNIT/ML Sopn FlexTouch Pen Commonly known as:  TRESIBA FLEXTOUCH Inject 14 Units into the skin daily.   metFORMIN 500 MG 24 hr tablet Commonly known as:  GLUCOPHAGE-XR Take 2 tablets (1,000 mg total) by mouth 2 (two) times daily.   omeprazole 40 MG capsule Commonly known as:  PRILOSEC Take 40 mg by mouth 2 (two) times daily.   ONE TOUCH ULTRA TEST test strip Generic drug:  glucose blood USE AS INSTRUCTED TO CHECK BLOOD SUGAR  ONCE A DAY   pravastatin 20 MG tablet Commonly known as:  PRAVACHOL TAKE 1 TABLET BY MOUTH ONCE A DAY       Allergies:  Allergies  Allergen Reactions  . Vicodin [Hydrocodone-Acetaminophen] Nausea Only    Past Medical History:  Diagnosis Date  . Diabetes mellitus without complication [facility])     Past Surgical History:  Procedure Laterality Date  . APPENDECTOMY    . CHOLECYSTECTOMY    . PANCREAS SURGERY     Tumor head of pancreas, incompletely resected    Family History  Problem Relation Age of Onset  . Hyperlipidemia Father   . Hypertension Father   . Coronary artery disease Father   . Diabetes Brother   . Heart disease Paternal Grandfather     Social History:  reports that she has never smoked. She has never used smokeless tobacco. She reports that she drinks alcohol. She reports that she does not use drugs.    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 Was treated with radiation and subsequently her tumor has been stable and asymptomatic. Biopsy showed Adenocarcinoma, Islet Cell type.  Her records are reviewed again and indicate tumor was 6.9 cm on ultrasound in 2012; periodically has had MRI  or other studies done with no growth between 2012 and 2013  Her ultrasound shows that the tumor is on the last ultrasound 5.9 cm Done in 11/2014 Last CT scan shows lesion to be 6 cm       Lipids: was on Lipitor  previously when she stopped it because of generalized aches and pains which were significant  She  does have a fairly significant family history of CAD She was taking pravastatin 20 mg size of her last visit, stopped it in  December 2017  LDL is normal  Lab Results  Component Value Date   CHOL 146 04/18/2016   HDL 37.20 (L) 04/18/2016   LDLCALC 93 04/18/2016   LDLDIRECT 134.4 08/19/2013   TRIG 80.0 04/18/2016   CHOLHDL 4 04/18/2016      She has had severe vitamin D deficiency diagnosed in 2009 with a level of 5.5.  Just back on Supplement with resuming her diet after surgery  Lab Results  Component Value Date   VD25OH 16.91 (L) 04/18/2016   VD25OH 26.03 (L) 09/28/2015   VD25OH 21.78 (L) 06/15/2015   VD25OH 34.97 11/18/2013    She had fatigue and her B12 level was checked Her B-12 level Was low at 143 and Has resumed the supplement now   Physical Examination:  BP 114/80   Pulse 97   Ht 4\' 11"  (1.499 m)   Wt 114 lb 6.4 oz (51.9 kg)   SpO2 99%   BMI 23.11 kg/m     ASSESSMENT/PLAN:   Diabetes type 2 with BMI 27 See history of present illness for detailed discussion of current management, blood sugar patterns and problems identified  Her blood sugars are overall fairly good with A1c 6.9 However she is now starting to have higher postprandial readings with not taking Novolog gain With her weight loss she has needed less mealtime insulin but since she is starting to eat better and getting carbohydrates in her diet she is probably getting higher readings She has not monitored her blood sugars or did not bring the monitor that she normally uses  VITAMIN B12  deficiency: She needs to be on long-term supplementation now  Recommendations:  Start checking blood sugars regularly and more often  including after meals  She will need to use the Novolog as before with 1:15 carb ratio to start with  No change in [place] as yet  Will need short-term follow-up to reassess her control   Patient Instructions  Check blood sugars on waking up  3x weekly  Also check blood sugars about 2 hours after a meal and do this after different meals by rotation especially dinner  Recommended blood sugar  levels on waking up is 90-130 and about 2 hours after meal is 130-160  Please bring your blood sugar monitor to each visit, thank you       [facility] 07/11/2016, 9:52 AM

## 2016-07-11 NOTE — Patient Instructions (Addendum)
Check blood sugars on waking up  3x weekly  Also check blood sugars about 2 hours after a meal and do this after different meals by rotation especially dinner  Recommended blood sugar levels on waking up is 90-130 and about 2 hours after meal is 130-160  Please bring your blood sugar monitor to each visit, thank you  Novolog 1 unit per 15g

## 2016-07-12 ENCOUNTER — Other Ambulatory Visit: Payer: Self-pay

## 2016-07-26 ENCOUNTER — Other Ambulatory Visit: Payer: Self-pay

## 2016-07-26 ENCOUNTER — Telehealth: Payer: Self-pay | Admitting: Endocrinology

## 2016-07-26 MED ORDER — METFORMIN HCL ER 500 MG PO TB24: 1000.0000 mg | ORAL_TABLET | Freq: Two times a day (BID) | ORAL | 3 refills | Status: DC

## 2016-07-26 NOTE — Telephone Encounter (Signed)
Patient called to advise that we called in metformin incorrectly to [facility].i do not see active listing for metformin. She states that the qty should be 120/ month, but it has been called in for 60   She also states that she is unable to get BD PEN NEEDLE NANO U/F 32G X 4 MM MISC [[id number]] at the same time as her medication, because it comes in boxes of 100. They make her wait 15 days after she fills her meds to get more needles.     Verified her cell #

## 2016-07-26 NOTE — Telephone Encounter (Signed)
Called patient and left a voice message to let her know that we have not filled her Metformin Rx recently ( Last filled in 11/2015) but I sent a new refill order into [facility] for her for her normal 120 script.

## 2016-09-04 ENCOUNTER — Other Ambulatory Visit: Payer: Self-pay | Admitting: Endocrinology

## 2016-09-04 DIAGNOSIS — E1165 Type 2 diabetes mellitus with hyperglycemia: Secondary | ICD-10-CM

## 2016-09-04 DIAGNOSIS — E559 Vitamin D deficiency, unspecified: Secondary | ICD-10-CM

## 2016-09-04 DIAGNOSIS — Z794 Long term (current) use of insulin: Principal | ICD-10-CM

## 2016-09-05 ENCOUNTER — Other Ambulatory Visit (INDEPENDENT_AMBULATORY_CARE_PROVIDER_SITE_OTHER): Payer: PRIVATE HEALTH INSURANCE

## 2016-09-05 DIAGNOSIS — Z794 Long term (current) use of insulin: Secondary | ICD-10-CM | POA: Diagnosis not present

## 2016-09-05 DIAGNOSIS — E1165 Type 2 diabetes mellitus with hyperglycemia: Secondary | ICD-10-CM

## 2016-09-05 DIAGNOSIS — E559 Vitamin D deficiency, unspecified: Secondary | ICD-10-CM

## 2016-09-05 LAB — LIPID PANEL
CHOLESTEROL: 175 mg/dL (ref 0–200)
HDL: 59.3 mg/dL (ref >39.00)
LDL Cholesterol: 99 mg/dL (ref 0–99)
NonHDL: 115.62
Total CHOL/HDL Ratio: 3
Triglycerides: 84 mg/dL (ref 0.0–149.0)
VLDL: 16.8 mg/dL (ref 0.0–40.0)

## 2016-09-05 LAB — BASIC METABOLIC PANEL
BUN: 18 mg/dL (ref 6–23)
CO2: 28 mEq/L (ref 19–32)
CREATININE: 0.55 mg/dL (ref 0.40–1.20)
Calcium: 9.1 mg/dL (ref 8.4–10.5)
Chloride: 105 mEq/L (ref 96–112)
GFR: 127.25 mL/min (ref >60.00)
Glucose, Bld: 111 mg/dL — ABNORMAL HIGH (ref 70–99)
Potassium: 3.9 mEq/L (ref 3.5–5.1)
Sodium: 139 mEq/L (ref 135–145)

## 2016-09-05 LAB — VITAMIN D 25 HYDROXY (VIT D DEFICIENCY, FRACTURES): VITD: 41.6 ng/mL (ref 30.00–100.00)

## 2016-09-06 LAB — FRUCTOSAMINE: Fructosamine: 248 umol/L (ref 0–285)

## 2016-09-16 ENCOUNTER — Ambulatory Visit: Payer: PRIVATE HEALTH INSURANCE | Admitting: Endocrinology

## 2016-09-22 ENCOUNTER — Other Ambulatory Visit: Payer: Self-pay | Admitting: Endocrinology

## 2016-09-27 ENCOUNTER — Other Ambulatory Visit: Payer: Self-pay

## 2016-09-27 MED ORDER — INSULIN PEN NEEDLE 32G X 4 MM MISC: 4 refills | Status: DC

## 2016-10-17 ENCOUNTER — Ambulatory Visit (INDEPENDENT_AMBULATORY_CARE_PROVIDER_SITE_OTHER): Payer: PRIVATE HEALTH INSURANCE | Admitting: Endocrinology

## 2016-10-17 ENCOUNTER — Encounter: Payer: Self-pay | Admitting: Endocrinology

## 2016-10-17 VITALS — BP 110/64 | HR 94 | Ht 59.0 in | Wt 127.8 lb

## 2016-10-17 DIAGNOSIS — E119 Type 2 diabetes mellitus without complications: Secondary | ICD-10-CM | POA: Diagnosis not present

## 2016-10-17 DIAGNOSIS — R5383 Other fatigue: Secondary | ICD-10-CM

## 2016-10-17 LAB — POCT GLYCOSYLATED HEMOGLOBIN (HGB A1C): Hemoglobin A1C: 5.8

## 2016-10-17 NOTE — Progress Notes (Signed)
Patient ID: Anna Jennings, female   DOB: 1970-03-27, 46 y.o.   MRN: [id number]   Reason for Appointment : Followup for Type 2 Diabetes  History of Present Illness          Diagnosis: Type 2 diabetes mellitus, date of diagnosis: 2007       Past history: She was initially diagnosed with a two-hour postprandial glucose of 218 in 08/2005 and baseline A1c of 5.7 Apparently she was not treated with oral hypoglycemic drugs initially and probably started on metformin in 2009. She did have diarrhea with the regular metformin but tolerated extended release preparation better with no side effects even on 2000 mg She thinks her blood sugars are fairly well controlled initially but a couple of years later with higher readings she was given Amaryl also. The dose of this was gradually increased Her A1c readings have been ranging from 6.5-7.2 in between 2011 and 2013 but no further followup done after 11/2011 She had been out of her medications prior to her initial visit here in 12/14 and her blood sugars were progressively higher.  She had side effects of  bloating and swelling with pioglitazone and her [name] was stopped Because of poor control and A1c of 8.5 in 3/15 she was started on Levemir insulin  Recent history:   INSULIN: Tresiba 16 units at bedtime.  Novolog before meals 1:15 carbohydrate ratio  Oral hypoglycemic drugs the patient is taking are: Glyxambi 10/5,  metformin ER 2 g qd  A1c is markedly improved at 5.8, previously 6.9  Current management, blood sugar patterns and problems identified with management:   She has been eating better since her last visit when she had lost weight after her hospitalization  She has checked her blood sugars mostly during the day and only occasionally in the evenings  Blood sugars are generally fairly good with current regimen of insulin  She usually taking only 2 units NovoLog to cover her breakfast when she is mostly eating [place]  yogurt  Only occasionally when she goes off her diet with carbohydrates blood sugars may be high, highest reading 209 at bedtime  No significant hypoglycemia  TRESIBA was increased by 2 units on the last visit and her fasting readings appear to be fairly good  Her weight has come back up significantly        Side effects from medications have been: Diarrhea from regular metformin   Glucose monitoring:  <1  times a day       Glucometer: One Touch ultra 2 .   Mean values apply above for all meters except median for One Touch  PRE-MEAL Fasting Lunch Dinner Bedtime Overall  Glucose range: 86-1 39  65-1 55   134-209    Mean/median: 108     112+/-36     Glycemic control: Lab Results  Component Value Date   HGBA1C 5.8 10/17/2016   HGBA1C 6.9 (H) 07/04/2016   HGBA1C 6.9 (H) 04/18/2016   Lab Results  Component Value Date   MICROALBUR 1.4 04/18/2016   LDLCALC 99 09/05/2016   CREATININE 0.55 09/05/2016   Lab Results  Component Value Date   FRUCTOSAMINE 248 09/05/2016   FRUCTOSAMINE 322 (H) 08/17/2015    Self-care: The diet that the patient has been following is: tries to limit portions, usually has some carbohydrate at every meal     Meals: 2 meals per day.  no breakfast, am snack, sandwich at lunch;  Some rice at meals Occasionally which she  thinks tends to raise her sugar for couple of days          Exercise: walking a little, Limited by fatigue recently  Dietician visit: Most recent:2011?                 Compliance with the medical regimen: Good   Weight history:  Wt Readings from Last 3 Encounters:  10/17/16 127 lb 12.8 oz (58 kg)  07/11/16 114 lb 6.4 oz (51.9 kg)  05/20/16 125 lb (56.7 kg)    Office Visit on 10/17/2016  Component Date Value Ref Range Status  . Hemoglobin A1C 10/17/2016 5.8   Final    Allergies as of 10/17/2016      Reactions   Vicodin [hydrocodone-acetaminophen] Nausea Only      Medication List       Accurate as of 10/17/16  2:36 PM.  Always use your most recent med list.          cetirizine 10 MG tablet Commonly known as:  ZYRTEC Take 10 mg by mouth daily.   Empagliflozin-Linagliptin 10-5 MG Tabs Commonly known as:  GLYXAMBI Take 1 tablet by mouth daily.   EPINEPHrine 0.3 mg/0.3 mL Soaj injection Commonly known as:  EPI-PEN Inject 0.3 mLs (0.3 mg total) into the muscle once.   glucose blood test strip Commonly known as:  ONE TOUCH ULTRA TEST USE AS INSTRUCTED TO CHECK BLOOD SUGAR ONCE A DAY   ibuprofen 200 MG tablet Commonly known as:  ADVIL,MOTRIN Take 400 mg by mouth every 6 (six) hours as needed for headache, mild pain or moderate pain.   Insulin Pen Needle 32G X 4 MM Misc Commonly known as:  BD PEN NEEDLE NANO U/F USE ONE PEN NEEDLE 4 TIMES PER DAY TO INJECT INSULIN   metFORMIN 500 MG 24 hr tablet Commonly known as:  GLUCOPHAGE-XR Take 2 tablets (1,000 mg total) by mouth 2 (two) times daily.   NOVOLOG FLEXPEN 100 UNIT/ML FlexPen Generic drug:  insulin aspart INJECT 6 TO 8 UNITS SUBCUTANEOUSLY 3 TIMES A DAY   TRESIBA FLEXTOUCH 100 UNIT/ML Sopn FlexTouch Pen Generic drug:  insulin degludec INJECT 14 UNITS INTO THE SKIN DAILY.       Allergies:  Allergies  Allergen Reactions  . Vicodin [Hydrocodone-Acetaminophen] Nausea Only    Past Medical History:  Diagnosis Date  . Diabetes mellitus without complication [facility])     Past Surgical History:  Procedure Laterality Date  . APPENDECTOMY    . CHOLECYSTECTOMY    . PANCREAS SURGERY     Tumor head of pancreas, incompletely resected    Family History  Problem Relation Age of Onset  . Hyperlipidemia Father   . Hypertension Father   . Coronary artery disease Father   . Diabetes Brother   . Heart disease Paternal Grandfather     Social History:  reports that she has never smoked. She has never used smokeless tobacco. She reports that she drinks alcohol. She reports that she does not use drugs.    Review of Systems       Lipids: was  on Lipitor  previously when she stopped it because of generalized aches and pains which were significant  She  does have a fairly significant family history of CAD She was taking pravastatin 20 mg and this was also stopped  in December 2017 because of aching  LDL is normal  Lab Results  Component Value Date   CHOL 175 09/05/2016   HDL 59.30 09/05/2016   LDLCALC 99  09/05/2016   LDLDIRECT 134.4 08/19/2013   TRIG 84.0 09/05/2016   CHOLHDL 3 09/05/2016      She has had severe vitamin D deficiency diagnosed in 2009 with a level of 5.5. She is taking 2000 units daily as directed  Lab Results  Component Value Date   VD25OH 41.60 09/05/2016   VD25OH 16.91 (L) 04/18/2016   VD25OH 26.03 (L) 09/28/2015   VD25OH 21.78 (L) 06/15/2015    She had fatigue and her B12 level was checked Her B-12 level Was low at 143 and Is taking her supplement  She is having some morning stiffness in her hands and some muscle aches, does not see her PCP   Physical Examination:  BP 110/64   Pulse 94   Ht 4\' 11"  (1.499 m)   Wt 127 lb 12.8 oz (58 kg)   SpO2 98%   BMI 25.81 kg/m     ASSESSMENT/PLAN:   Diabetes type 2 with BMI 27 See history of present illness for detailed discussion of current management, blood sugar patterns and problems identified  Her blood sugars are overall Much better with A1c now below 6 She has fairly good blood sugars at home although not enough readings after meals Recent average blood sugar 116 Also recently starting to do a little exercise  VITAMIN D and B12 deficiency: She is compliant with her supplementation now  Recommendations: No change in insulin regimen She will try to check more readings after meals to help adjust her insulin If she is going to be exercising more rigorously she will need to compensate with either a snack or reduced mealtime insulin if exercising after eating   There are no Patient Instructions on file for this visit.      Anna Jennings 10/17/2016, 2:36 PM

## 2016-10-26 ENCOUNTER — Other Ambulatory Visit: Payer: Self-pay | Admitting: Endocrinology

## 2016-11-26 ENCOUNTER — Other Ambulatory Visit: Payer: Self-pay | Admitting: Endocrinology

## 2016-12-31 ENCOUNTER — Other Ambulatory Visit: Payer: Self-pay | Admitting: Endocrinology

## 2017-01-23 ENCOUNTER — Ambulatory Visit (INDEPENDENT_AMBULATORY_CARE_PROVIDER_SITE_OTHER): Payer: PRIVATE HEALTH INSURANCE | Admitting: Family Medicine

## 2017-01-23 ENCOUNTER — Encounter: Payer: Self-pay | Admitting: Family Medicine

## 2017-01-23 VITALS — BP 122/78 | HR 82 | Temp 98.3°F | Ht 59.0 in | Wt 137.0 lb

## 2017-01-23 DIAGNOSIS — R5383 Other fatigue: Secondary | ICD-10-CM | POA: Diagnosis not present

## 2017-01-23 DIAGNOSIS — Z23 Encounter for immunization: Secondary | ICD-10-CM

## 2017-01-23 DIAGNOSIS — E119 Type 2 diabetes mellitus without complications: Secondary | ICD-10-CM | POA: Insufficient documentation

## 2017-01-23 DIAGNOSIS — Z0000 Encounter for general adult medical examination without abnormal findings: Secondary | ICD-10-CM | POA: Diagnosis not present

## 2017-01-23 NOTE — Progress Notes (Addendum)
Subjective:  Patient ID: Anna Jennings, female    DOB: [birth year]  Age: 46 y.o. MRN: [id number]  CC: Establish Care   HPI Anna Jennings presents for establishment of care.  She carries a story past medical history.  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  She tells me that this tumor was unresectable.  It has been treated with radiation therapy.  It has decreased in size. It is being followed by general surgery.  As expected she did develop diabetes.  Her current medical therapy through endocrinology is documented.  She uses her preprandial insulin for large meals only.  She will have her diabetic eye exam in December.  She has been diagnosed with B12 and vitamin D deficiency.  She is taking over-the-counter medicines for these issues.  Endocrine is following these as well.  She lives with her husband and 68 year old daughter.  She works as a Copywriter, advertising.  Of note she lacked one year of completing dental school in [place] that was interupted by the diagnosis of her islet cell tumor.  Back in March of this year she developed an intestinal obstruction.  She endured 2 abdominal surgeries 1 week apart to correct this problem.  She was out of work for 2 months.  Her weight decreased to 109 lbs. She does complain of some ongoing fatigue.  This is understandable when considering her past medical history but she has not had a recent TSH.  Of special note, she has been unable to tolerate multiple statins.  She tells me that her lipid profiles have been near no normal without therapy.  She does not smoke.  She drinks alcohol socially.  History Anna Jennings has a past medical history of Diabetes mellitus without complication ([facility]).   She has a past surgical history that includes Cholecystectomy; Appendectomy; and Pancreas surgery.   Her family history includes Coronary artery disease in her father; Diabetes in her brother; Heart disease in her paternal  grandfather; Hyperlipidemia in her father; Hypertension in her father.She reports that  has never smoked. she has never used smokeless tobacco. She reports that she drinks alcohol. She reports that she does not use drugs.  Outpatient Medications Prior to Visit  Medication Sig Dispense Refill  . cetirizine (ZYRTEC) 10 MG tablet Take 10 mg by mouth daily.    Marland Kitchen GLYXAMBI 10-5 MG TABS TAKE 1 TABLET BY MOUTH DAILY 30 tablet 2  . ibuprofen (ADVIL,MOTRIN) 200 MG tablet Take 400 mg by mouth every 6 (six) hours as needed for headache, mild pain or moderate pain.     . Insulin Pen Needle (BD PEN NEEDLE NANO U/F) 32G X 4 MM MISC USE ONE PEN NEEDLE 4 TIMES PER DAY TO INJECT INSULIN 130 each 4  . metFORMIN (GLUCOPHAGE-XR) 500 MG 24 hr tablet take 2 tablets by mouth 2 times daily 120 tablet 2  . NOVOLOG FLEXPEN 100 UNIT/ML FlexPen INJECT 6 TO 8 UNITS SUBCUTANEOUSLY 3 TIMES A DAY 15 mL 4  . ONE TOUCH ULTRA TEST test strip USE AS INSTRUCTED TO CHECK BLOOD SUGAR ONCE A DAY 50 each 0  . TRESIBA FLEXTOUCH 100 UNIT/ML SOPN FlexTouch Pen INJECT 14 UNITS INTO THE SKIN DAILY. (Patient taking differently: INJECT 16 UNITS INTO THE SKIN DAILY.) 15 mL 1   No facility-administered medications prior to visit.     ROS Review of Systems  Constitutional: Negative.   HENT: Negative.   Eyes: Negative.   Respiratory: Negative.  Cardiovascular: Negative.   Gastrointestinal: Negative.   Endocrine: Negative for polyphagia and polyuria.  Genitourinary: Negative.   Musculoskeletal: Negative for arthralgias.  Neurological: Negative.   Hematological: Negative.   Psychiatric/Behavioral: Negative.     Objective:  BP 122/78 (BP Location: Left Arm, Patient Position: Sitting, Cuff Size: Normal)   Pulse 82   Temp 98.3 F (36.8 C) (Oral)   Ht 4\' 11"  (1.499 m)   Wt 137 lb (62.1 kg)   SpO2 98%   BMI 27.67 kg/m   Physical Exam  Constitutional: She is oriented to person, place, and time. She appears well-developed and  well-nourished. No distress.  HENT:  Head: Normocephalic and atraumatic.  Right Ear: External ear normal.  Left Ear: External ear normal.  Mouth/Throat: Oropharynx is clear and moist. No oropharyngeal exudate.  Eyes: Conjunctivae are normal. Pupils are equal, round, and reactive to light. Right eye exhibits no discharge. Left eye exhibits no discharge. No scleral icterus.  Neck: Neck supple. No JVD present. No tracheal deviation present. No thyromegaly present.  Cardiovascular: Normal rate, regular rhythm and normal heart sounds.  Pulses:      Dorsalis pedis pulses are 2+ on the right side, and 2+ on the left side.       Posterior tibial pulses are 2+ on the right side, and 2+ on the left side.  Pulmonary/Chest: Effort normal and breath sounds normal. No stridor.  Abdominal: Soft. Bowel sounds are normal. She exhibits no distension. There is no tenderness.    Lymphadenopathy:    She has no cervical adenopathy.  Neurological: She is alert and oriented to person, place, and time.  Skin: Skin is warm and dry. She is not diaphoretic.  Psychiatric: She has a normal mood and affect. Her behavior is normal.   Diabetic foot exam:  Left: Reflexes 2+   Vibratory sensation normal  Proprioception normal  Sharp/dull discrimination normal  Filament test present Right: Reflexes 2+   Vibratory sensation normal  Proprioception normal  Sharp/dull discrimination normal  Filament test present    Assessment & Plan:   Anna Jennings was seen today for establish care.  Diagnoses and all orders for this visit:  Need for influenza vaccination -     Flu Vaccine QUAD 36+ mos IM  Need for 23-polyvalent pneumococcal polysaccharide vaccine -     Pneumococcal polysaccharide vaccine 23-valent greater than or equal to 2yo subcutaneous/IM  Diabetes mellitus without complication (HCC) -     HM Diabetes Foot Exam  Fatigue, unspecified type -     TSH  Healthcare maintenance -     Hepatitis C antibody -      HIV antibody   I am having Anna Jennings maintain her ibuprofen, TRESIBA FLEXTOUCH, NOVOLOG FLEXPEN, Insulin Pen Needle, cetirizine, GLYXAMBI, metFORMIN, and ONE TOUCH ULTRA TEST.  No orders of the defined types were placed in this encounter.    Follow-up: No Follow-up on file.  Libby Maw, MD

## 2017-01-23 NOTE — Addendum Note (Signed)
Addended by: Jon Billings on: 01/23/2017 04:27 PM   Modules accepted: Orders

## 2017-01-27 ENCOUNTER — Other Ambulatory Visit (INDEPENDENT_AMBULATORY_CARE_PROVIDER_SITE_OTHER): Payer: PRIVATE HEALTH INSURANCE

## 2017-01-27 DIAGNOSIS — R5383 Other fatigue: Secondary | ICD-10-CM

## 2017-01-27 DIAGNOSIS — E119 Type 2 diabetes mellitus without complications: Secondary | ICD-10-CM

## 2017-01-27 LAB — COMPREHENSIVE METABOLIC PANEL
ALK PHOS: 79 U/L (ref 39–117)
ALT: 21 U/L (ref 0–35)
AST: 24 U/L (ref 0–37)
Albumin: 4.1 g/dL (ref 3.5–5.2)
BILIRUBIN TOTAL: 0.4 mg/dL (ref 0.2–1.2)
BUN: 18 mg/dL (ref 6–23)
CO2: 30 meq/L (ref 19–32)
CREATININE: 0.63 mg/dL (ref 0.40–1.20)
Calcium: 9.2 mg/dL (ref 8.4–10.5)
Chloride: 102 mEq/L (ref 96–112)
GFR: 108.6 mL/min (ref >60.00)
GLUCOSE: 99 mg/dL (ref 70–99)
Potassium: 3.7 mEq/L (ref 3.5–5.1)
Sodium: 137 mEq/L (ref 135–145)
TOTAL PROTEIN: 7.3 g/dL (ref 6.0–8.3)

## 2017-01-27 LAB — TSH
TSH: 2.14 u[IU]/mL (ref 0.35–4.50)
TSH: 2.04 u[IU]/mL (ref 0.35–4.50)

## 2017-01-27 LAB — HEMOGLOBIN A1C: Hgb A1c MFr Bld: 6 % (ref 4.6–6.5)

## 2017-01-28 LAB — HEPATITIS C ANTIBODY
HEP C AB: NONREACTIVE
SIGNAL TO CUT-OFF: 0.01 (ref <1.00)

## 2017-01-28 LAB — HIV ANTIBODY (ROUTINE TESTING W REFLEX): HIV: NONREACTIVE

## 2017-01-30 ENCOUNTER — Other Ambulatory Visit: Payer: Self-pay

## 2017-01-30 ENCOUNTER — Other Ambulatory Visit: Payer: PRIVATE HEALTH INSURANCE

## 2017-01-30 ENCOUNTER — Encounter: Payer: Self-pay | Admitting: Endocrinology

## 2017-01-30 ENCOUNTER — Ambulatory Visit (INDEPENDENT_AMBULATORY_CARE_PROVIDER_SITE_OTHER): Payer: PRIVATE HEALTH INSURANCE | Admitting: Endocrinology

## 2017-01-30 VITALS — BP 114/70 | HR 94 | Ht 59.0 in | Wt 135.0 lb

## 2017-01-30 DIAGNOSIS — E119 Type 2 diabetes mellitus without complications: Secondary | ICD-10-CM | POA: Diagnosis not present

## 2017-01-30 MED ORDER — ONETOUCH VERIO IQ SYSTEM W/DEVICE KIT: PACK | 1 refills | Status: DC

## 2017-01-30 MED ORDER — INSULIN ASPART 100 UNIT/ML FLEXPEN: PEN_INJECTOR | SUBCUTANEOUS | 4 refills | Status: DC

## 2017-01-30 MED ORDER — INSULIN DEGLUDEC 100 UNIT/ML ~~LOC~~ SOPN
PEN_INJECTOR | SUBCUTANEOUS | 1 refills | Status: DC
Start: 2017-01-30 — End: 2017-04-04

## 2017-01-30 MED ORDER — INSULIN PEN NEEDLE 32G X 4 MM MISC: 4 refills | Status: DC

## 2017-01-30 MED ORDER — GLUCOSE BLOOD VI STRP: ORAL_STRIP | 1 refills | Status: DC

## 2017-01-30 NOTE — Patient Instructions (Signed)
Check blood sugars on waking up  2-3/7  Also check blood sugars about 2 hours after a meal and do this after different meals by rotation  Recommended blood sugar levels on waking up is 90-130 and about 2 hours after meal is 130-160  Please bring your blood sugar monitor to each visit, thank you   

## 2017-01-30 NOTE — Progress Notes (Signed)
Patient ID: Anna Jennings, female   DOB: 01-14-1971, 46 y.o.   MRN: [id number]   Reason for Appointment : Followup for Type 2 Diabetes  History of Present Illness          Diagnosis: Type 2 diabetes mellitus, date of diagnosis: 2007       Past history: She was initially diagnosed with a two-hour postprandial glucose of 218 in 08/2005 and baseline A1c of 5.7 Apparently she was not treated with oral hypoglycemic drugs initially and probably started on metformin in 2009. She did have diarrhea with the regular metformin but tolerated extended release preparation better with no side effects even on 2000 mg She thinks her blood sugars are fairly well controlled initially but a couple of years later with higher readings she was given Amaryl also. The dose of this was gradually increased Her A1c readings have been ranging from 6.5-7.2 in between 2011 and 2013 but no further followup done after 11/2011 She had been out of her medications prior to her initial visit here in 12/14 and her blood sugars were progressively higher.  She had side effects of  bloating and swelling with pioglitazone and her [name] was stopped Because of poor control and A1c of 8.5 in 3/15 she was started on Levemir insulin  Recent history:   INSULIN: Tresiba 16 units at bedtime.  Novolog before meals 1:15 carbohydrate ratio, 4-5 units at lunch and dinner  Oral hypoglycemic drugs the patient is taking are: Glyxambi 10/5,  metformin ER 2 g qd  A1c is still excellent at 6%  Current management, blood sugar patterns and problems identified with management:   She has checked her blood sugars somewhat erratically and her monitor has incorrect date programmed but she is generally watching her blood sugar at different times  Only rarely has a reading around 150-160 but some of this may be related to either missing her [place] or change in diet  She has gained weight since her last visit in August  However she has not  been motivated to exercise as discussed previously  Although she does not have any low sugars recorded she thinks sometimes late morning before breakfast she will feel a little shaky even when the blood sugars are not below 90        Side effects from medications have been: Diarrhea from regular metformin   Glucose monitoring:  <1  times a day       Glucometer: One Touch ultra 2 .   Mean values apply above for all meters except median for One Touch  PRE-MEAL Fasting Lunch Dinner Bedtime Overall  Glucose range: 96-1 64  117, 132  116, 88  79-1 52    Mean/median:     117     Glycemic control:  Lab Results  Component Value Date   HGBA1C 6.0 01/27/2017   HGBA1C 5.8 10/17/2016   HGBA1C 6.9 (H) 07/04/2016   Lab Results  Component Value Date   MICROALBUR 1.4 04/18/2016   LDLCALC 99 09/05/2016   CREATININE 0.63 01/27/2017   Lab Results  Component Value Date   FRUCTOSAMINE 248 09/05/2016   FRUCTOSAMINE 322 (H) 08/17/2015    Self-care: The diet that the patient has been following is: tries to limit portions, usually has some carbohydrate at every meal     Meals: 2 meals per day.  no breakfast, am snack, sandwich at lunch;  Some rice at meals Occasionally which she thinks tends to raise her sugar for  couple of days          Exercise: walking a little   Dietician visit: Most recent:2011?                 Compliance with the medical regimen: Good   Weight history:  Wt Readings from Last 3 Encounters:  01/30/17 135 lb (61.2 kg)  01/23/17 137 lb (62.1 kg)  10/17/16 127 lb 12.8 oz (58 kg)    Lab on 01/27/2017  Component Date Value Ref Range Status  . TSH 01/27/2017 2.14  0.35 - 4.50 uIU/mL Final  . Sodium 01/27/2017 137  135 - 145 mEq/L Final  . Potassium 01/27/2017 3.7  3.5 - 5.1 mEq/L Final  . Chloride 01/27/2017 102  96 - 112 mEq/L Final  . CO2 01/27/2017 30  19 - 32 mEq/L Final  . Glucose, Bld 01/27/2017 99  70 - 99 mg/dL Final  . BUN 01/27/2017 18  6 - 23 mg/dL Final   . Creatinine, Ser 01/27/2017 0.63  0.40 - 1.20 mg/dL Final  . Total Bilirubin 01/27/2017 0.4  0.2 - 1.2 mg/dL Final  . Alkaline Phosphatase 01/27/2017 79  39 - 117 U/L Final  . AST 01/27/2017 24  0 - 37 U/L Final  . ALT 01/27/2017 21  0 - 35 U/L Final  . Total Protein 01/27/2017 7.3  6.0 - 8.3 g/dL Final  . Albumin 01/27/2017 4.1  3.5 - 5.2 g/dL Final  . Calcium 01/27/2017 9.2  8.4 - 10.5 mg/dL Final  . GFR 01/27/2017 108.60  >60.00 mL/min Final  . Hgb A1c MFr Bld 01/27/2017 6.0  4.6 - 6.5 % Final   Glycemic Control Guidelines for People with Diabetes:Non Diabetic:  <6%Goal of Therapy: <7%Additional Action Suggested:  >8%     Allergies as of 01/30/2017   No Known Allergies     Medication List        Accurate as of 01/30/17  1:30 PM. Always use your most recent med list.          cetirizine 10 MG tablet Commonly known as:  ZYRTEC Take 10 mg by mouth daily.   GLYXAMBI 10-5 MG Tabs Generic drug:  Empagliflozin-Linagliptin TAKE 1 TABLET BY MOUTH DAILY   ibuprofen 200 MG tablet Commonly known as:  ADVIL,MOTRIN Take 400 mg by mouth every 6 (six) hours as needed for headache, mild pain or moderate pain.   Insulin Pen Needle 32G X 4 MM Misc Commonly known as:  BD PEN NEEDLE NANO U/F USE ONE PEN NEEDLE 4 TIMES PER DAY TO INJECT INSULIN   metFORMIN 500 MG 24 hr tablet Commonly known as:  GLUCOPHAGE-XR take 2 tablets by mouth 2 times daily   NOVOLOG FLEXPEN 100 UNIT/ML FlexPen Generic drug:  insulin aspart INJECT 6 TO 8 UNITS SUBCUTANEOUSLY 3 TIMES A DAY   ONE TOUCH ULTRA TEST test strip Generic drug:  glucose blood USE AS INSTRUCTED TO CHECK BLOOD SUGAR ONCE A DAY   TRESIBA FLEXTOUCH 100 UNIT/ML Sopn FlexTouch Pen Generic drug:  insulin degludec INJECT 14 UNITS INTO THE SKIN DAILY.       Allergies:  No Known Allergies  Past Medical History:  Diagnosis Date  . Diabetes mellitus without complication [facility])     Past Surgical History:  Procedure Laterality Date   . APPENDECTOMY    . CHOLECYSTECTOMY    . PANCREAS SURGERY     Tumor head of pancreas, incompletely resected    Family History  Problem Relation Age of Onset  .  Hyperlipidemia Father   . Hypertension Father   . Coronary artery disease Father   . Diabetes Brother   . Heart disease Paternal Grandfather     Social History:  reports that  has never smoked. she has never used smokeless tobacco. She reports that she drinks alcohol. She reports that she does not use drugs.    Review of Systems       Lipids: was on Lipitor  previously when she stopped it because of generalized aches and pains which were significant  She  does have a fairly significant family history of CAD She was taking pravastatin 20 mg and this was also stopped  in December 2017 because of aching  LDL is as follows:  Lab Results  Component Value Date   CHOL 175 09/05/2016   HDL 59.30 09/05/2016   LDLCALC 99 09/05/2016   LDLDIRECT 134.4 08/19/2013   TRIG 84.0 09/05/2016   CHOLHDL 3 09/05/2016      She has had severe vitamin D deficiency diagnosed in 2009 with a level of 5.5. She is taking 2000 units daily as directed  Lab Results  Component Value Date   VD25OH 41.60 09/05/2016   VD25OH 16.91 (L) 04/18/2016   VD25OH 26.03 (L) 09/28/2015   VD25OH 21.78 (L) 06/15/2015    She had fatigue and her B12 level was checked Her B-12 level Was low at 143 and Is taking her supplement  She is having less morning stiffness in her hands and some muscle aches,    Physical Examination:  BP 114/70   Pulse 94   Ht 4\' 11"  (1.499 m)   Wt 135 lb (61.2 kg)   LMP 01/20/2017   SpO2 98%   BMI 27.27 kg/m     ASSESSMENT/PLAN:   Diabetes type 2 with BMI 27 See history of present illness for detailed discussion of current management, blood sugar patterns and problems identified  Her blood sugars are still well controlled with low-dose basal bolus regimen and oral agents Recent average blood sugar 117 but she has  not checked blood sugars enough Her weight recently has leveled off although has gained back most of the weight she had lost this year No consistent pattern of high blood sugars seen  Recommendations: No change in insulin regimen She will use a One Touch Verio meter Discussed adjusting Tresiba for fasting blood sugars are consistently out of the target range Also check blood sugars more consistently after meals, for now can continue covering her carbohydrates with the 1:15 ratio She does need to start walking for exercise regularly   LIPIDS: Will need follow-up  Patient Instructions  Check blood sugars on waking up  2-3/7  Also check blood sugars about 2 hours after a meal and do this after different meals by rotation  Recommended blood sugar levels on waking up is 90-130 and about 2 hours after meal is 130-160  Please bring your blood sugar monitor to each visit, thank you       [facility] 01/30/2017, 1:30 PM

## 2017-02-02 NOTE — Addendum Note (Signed)
Addended by: Jon Billings on: 02/02/2017 03:22 PM   Modules accepted: Level of Service

## 2017-02-06 ENCOUNTER — Ambulatory Visit: Payer: PRIVATE HEALTH INSURANCE | Admitting: Endocrinology

## 2017-02-08 ENCOUNTER — Telehealth: Payer: Self-pay

## 2017-02-08 NOTE — Telephone Encounter (Signed)
Prior authorization submitted to the insurance company to obtain the approval for the tresiba. Waiting on response.

## 2017-02-08 NOTE — Telephone Encounter (Signed)
Error

## 2017-02-17 ENCOUNTER — Other Ambulatory Visit: Payer: Self-pay

## 2017-02-17 ENCOUNTER — Telehealth: Payer: Self-pay | Admitting: Endocrinology

## 2017-02-17 MED ORDER — EMPAGLIFLOZIN-LINAGLIPTIN 10-5 MG PO TABS: 1.0000 | ORAL_TABLET | Freq: Every day | ORAL | 2 refills | Status: DC

## 2017-02-17 NOTE — Telephone Encounter (Signed)
This has to be done by her PCP 

## 2017-02-17 NOTE — Telephone Encounter (Signed)
Please advise if the Epinephrine is okay to fill? I do not see this on her medication list? Please advise.

## 2017-02-17 NOTE — Telephone Encounter (Signed)
I have refused the Epinephrine with a note that this medication needs to be filled by her PCP.

## 2017-02-20 ENCOUNTER — Other Ambulatory Visit: Payer: Self-pay | Admitting: Endocrinology

## 2017-03-22 ENCOUNTER — Encounter: Payer: Self-pay | Admitting: Family Medicine

## 2017-03-22 ENCOUNTER — Ambulatory Visit: Payer: PRIVATE HEALTH INSURANCE | Admitting: Family Medicine

## 2017-03-22 VITALS — BP 118/80 | HR 110 | Temp 99.0°F | Ht 59.0 in | Wt 137.5 lb

## 2017-03-22 DIAGNOSIS — R52 Pain, unspecified: Secondary | ICD-10-CM

## 2017-03-22 DIAGNOSIS — R509 Fever, unspecified: Secondary | ICD-10-CM | POA: Insufficient documentation

## 2017-03-22 DIAGNOSIS — J069 Acute upper respiratory infection, unspecified: Secondary | ICD-10-CM | POA: Insufficient documentation

## 2017-03-22 LAB — POC INFLUENZA A&B (BINAX/QUICKVUE)
Influenza A, POC: NEGATIVE
Influenza B, POC: NEGATIVE

## 2017-03-22 MED ORDER — AZITHROMYCIN 250 MG PO TABS: ORAL_TABLET | ORAL | 0 refills | Status: DC

## 2017-03-22 MED ORDER — GUAIFENESIN ER 600 MG PO TB12: 600.0000 mg | ORAL_TABLET | Freq: Two times a day (BID) | ORAL | 1 refills | Status: DC

## 2017-03-22 MED ORDER — BENZONATATE 200 MG PO CAPS: 200.0000 mg | ORAL_CAPSULE | Freq: Two times a day (BID) | ORAL | 0 refills | Status: DC | PRN

## 2017-03-22 NOTE — Progress Notes (Signed)
Subjective:  Patient ID: Anna Jennings, female    DOB: [birth year]  Age: 47 y.o. MRN: [id number]  CC: URI (started about 3-4 days ago. Returned from [place] about a week or so ago. Body aches, chills, sweats.) and Fever (99.9 is the highest fever, patient's temp usually runs low, so this is a fever for her. )   HPI Sharlene Tena presents for evaluation of URI.  She is without nasal congestion but has been blowing brown and green rhinorrhea.  He is experiencing facial pressure with some deep discomfort.  Her temperature has been elevated but she has had no frank fevers or chills.  She has started her husband's Zithromax.  When she breathes through her nose it sets off a cough.  She denies wheezing or difficulty breathing through her chest.  She has had no nausea or vomiting.  She has been home a week prior to being in the room before this started.  She has battled the cold down there that seem to have resolved before she came home.  Outpatient Medications Prior to Visit  Medication Sig Dispense Refill  . Blood Glucose Monitoring Suppl (ONETOUCH VERIO IQ SYSTEM) w/Device KIT Use to test blood sugars 3 times daily. 1 kit 1  . cetirizine (ZYRTEC) 10 MG tablet Take 10 mg by mouth daily.    . Empagliflozin-Linagliptin (GLYXAMBI) 10-5 MG TABS Take 1 tablet by mouth daily. 30 tablet 2  . glucose blood (ONETOUCH VERIO) test strip Use to check blood sugars 3 times daily. 300 each 1  . GLYXAMBI 10-5 MG TABS TAKE ONE TABLET BY MOUTH ONE TIME DAILY  30 tablet 1  . ibuprofen (ADVIL,MOTRIN) 200 MG tablet Take 400 mg by mouth every 6 (six) hours as needed for headache, mild pain or moderate pain.     Marland Kitchen insulin aspart (NOVOLOG FLEXPEN) 100 UNIT/ML FlexPen INJECT 6 TO 8 UNITS SUBCUTANEOUSLY 3 TIMES A DAY 15 mL 4  . insulin degludec (TRESIBA FLEXTOUCH) 100 UNIT/ML SOPN FlexTouch Pen INJECT 16 UNITS INTO THE SKIN DAILY. 15 mL 1  . Insulin Pen Needle (BD PEN NEEDLE NANO U/F) 32G X 4 MM MISC USE ONE PEN NEEDLE 4 TIMES PER  DAY TO INJECT INSULIN 130 each 4  . metFORMIN (GLUCOPHAGE-XR) 500 MG 24 hr tablet take 2 tablets by mouth 2 times daily 120 tablet 2  . ONE TOUCH ULTRA TEST test strip USE AS INSTRUCTED TO CHECK BLOOD SUGAR ONCE A DAY 50 each 0   No facility-administered medications prior to visit.     ROS Review of Systems  Constitutional: Positive for chills and fatigue. Negative for fever and unexpected weight change.  HENT: Positive for congestion, postnasal drip, rhinorrhea, sinus pressure and sinus pain. Negative for dental problem, ear discharge, ear pain, facial swelling, hearing loss, mouth sores and trouble swallowing.   Eyes: Negative for photophobia and visual disturbance.  Respiratory: Positive for cough. Negative for shortness of breath and wheezing.   Cardiovascular: Negative.   Gastrointestinal: Negative for abdominal distention, abdominal pain, nausea and vomiting.  Genitourinary: Negative.   Musculoskeletal: Positive for arthralgias and myalgias.  Skin: Negative for rash.  Neurological: Positive for numbness and headaches.  Hematological: Does not bruise/bleed easily.  Psychiatric/Behavioral: Negative.     Objective:  BP 118/80 (BP Location: Left Arm, Patient Position: Sitting, Cuff Size: Normal)   Pulse (!) 110   Temp 99 F (37.2 C) (Oral)   Ht 4' 11"  (1.499 m)   Wt 137 lb 8 oz (62.4 kg)  SpO2 97%   BMI 27.77 kg/m   BP Readings from Last 3 Encounters:  03/22/17 118/80  01/30/17 114/70  01/23/17 122/78    Wt Readings from Last 3 Encounters:  03/22/17 137 lb 8 oz (62.4 kg)  01/30/17 135 lb (61.2 kg)  01/23/17 137 lb (62.1 kg)    Physical Exam  Constitutional: She is oriented to person, place, and time. She appears well-developed and well-nourished. No distress.  HENT:  Head: Normocephalic and atraumatic.  Right Ear: External ear normal.  Left Ear: External ear normal.  Mouth/Throat: Oropharynx is clear and moist. No oropharyngeal exudate.  Eyes: Conjunctivae are  normal. Pupils are equal, round, and reactive to light. Right eye exhibits no discharge. Left eye exhibits no discharge. No scleral icterus.  Neck: Neck supple. No JVD present. No tracheal deviation present. No thyromegaly present.  Cardiovascular: Normal rate, regular rhythm and normal heart sounds.  Pulmonary/Chest: Effort normal and breath sounds normal. No respiratory distress.  Abdominal: Bowel sounds are normal.  Lymphadenopathy:    She has no cervical adenopathy.  Neurological: She is alert and oriented to person, place, and time.  Skin: Skin is warm and dry. She is not diaphoretic.  Psychiatric: She has a normal mood and affect. Her behavior is normal.    Lab Results  Component Value Date   WBC 6.9 05/20/2016   HGB 11.1 (L) 05/20/2016   HCT 33.5 (L) 05/20/2016   PLT 278 05/20/2016   GLUCOSE 99 01/27/2017   CHOL 175 09/05/2016   TRIG 84.0 09/05/2016   HDL 59.30 09/05/2016   LDLDIRECT 134.4 08/19/2013   LDLCALC 99 09/05/2016   ALT 21 01/27/2017   AST 24 01/27/2017   NA 137 01/27/2017   K 3.7 01/27/2017   CL 102 01/27/2017   CREATININE 0.63 01/27/2017   BUN 18 01/27/2017   CO2 30 01/27/2017   TSH 2.04 01/27/2017   HGBA1C 6.0 01/27/2017   MICROALBUR 1.4 04/18/2016    Ct Abdomen Pelvis W Contrast  Result Date: 05/21/2016 CLINICAL DATA:  47 year old female with 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 Nausea vomiting. EXAM: CT ABDOMEN AND PELVIS WITH CONTRAST TECHNIQUE: Multidetector CT imaging of the abdomen and pelvis was performed using the standard protocol following bolus administration of intravenous contrast. CONTRAST:  163m ISOVUE-300 IOPAMIDOL (ISOVUE-300) INJECTION 61% COMPARISON:  Abdominal ultrasound dated 12/01/2014 FINDINGS: Lower chest: The visualized lung bases are clear. No intra-abdominal free air or free fluid. Hepatobiliary: Faint subcentimeter low attenuating focus in the left lobe of  the liver (series 2, image 17) is not well characterized. There is mild periportal edema. Cholecystectomy. Pancreas: There is postsurgical changes of the pancreas. The body and tail of the pancreas are are not visualized, possibly surgically absent or atrophic. There is a 2.9 x 4.9 cm complex mass or collection in the region of the head and uncinate process of the pancreas. This collection contains low attenuating content with small pockets of air as well as curvilinear high attenuating material. Findings may represent postsurgical changes with packing and surgical material versus necrotic an infected debris. There is associated inflammatory changes. Spleen: Normal in size without focal abnormality. Adrenals/Urinary Tract: The adrenal glands are unremarkable. There is no hydronephrosis on either side. There is symmetric uptake and excretion of contrast by kidneys bilaterally. The visualized ureters and urinary bladder appear unremarkable. Stomach/Bowel: There is severe distention of the stomach with oral content. There is inflammatory changes and thickening of the  distal stomach with associated high-grade luminal narrowing of the gastric antrum and pylorus secondary to mass effect and inflammatory changes of the mass/collection of the head of the pancreas. Moderate amount of stool noted throughout the colon. There is no evidence of small-bowel obstruction. Normal appendix. Vascular/Lymphatic: The abdominal aorta and IVC appear unremarkable. The origins of the celiac axis, SMA, IMA appear patent. There is mild mass effect and narrowing of the porta splenic confluence as well as mild narrowing of the proximal portion of the main portal vein. The SMV, splenic vein, and main portal vein however remain patent. There is no adenopathy. Reproductive: The uterus is retroverted. An intrauterine device is noted. Evaluation for the IUD positioning is somewhat limited on CT. The ovaries are grossly unremarkable. Other: Small fat  containing umbilical hernia. Musculoskeletal: No acute or significant osseous findings. IMPRESSION: 1. Complex an inflamed mass/collection at the head of the pancreas as described may represent combination of postsurgical changes and necrotic tissue. Correlation with clinical exam and surgical history recommended. There is associated reactive inflammation and mass effect on the gastric antrum and pylorus with luminal narrowing of the distal stomach and a degree of gastric outlet obstruction. The stomach is distended with oral content. Patient may benefit from placement of an NG tube. 2. Mild narrowing of the portasplenic confluence bite inflammatory changes of the head of the pancreas. 3. Subcentimeter left hepatic hypodense lesion is not well characterized. 4. No adenopathy lumbar 5 moderate colonic stool burden. No small bowel obstruction. Normal appendix. Electronically Signed   By: Anner Crete M.D.   On: 05/21/2016 00:39   Dg Abd Portable 1v  Result Date: 05/21/2016 CLINICAL DATA:  Nasogastric tube placement.  Initial encounter. EXAM: PORTABLE ABDOMEN - 1 VIEW COMPARISON:  CT of the abdomen and pelvis performed earlier today at 12:12 a.m. FINDINGS: The enteric tube is seen ending overlying the body of the stomach. The stomach is largely filled with solid material. The visualized bowel gas pattern is otherwise grossly unremarkable. No free intra-abdominal air is seen, though evaluation for free air is limited on a single supine view. An intrauterine device is noted overlying the mid pelvis. Contrast is seen within the bladder. No acute osseous abnormalities are seen. IMPRESSION: Enteric tube noted ending overlying the body of the stomach. The stomach is largely filled with solid material. Electronically Signed   By: Garald Balding M.D.   On: 05/21/2016 02:43    Assessment & Plan:   Leshea was seen today for uri and fever.  Diagnoses and all orders for this visit:  Body aches -     POC  Influenza A&B(BINAX/QUICKVUE)  Fever, unspecified fever cause -     POC Influenza A&B(BINAX/QUICKVUE)  Upper respiratory tract infection, unspecified type -     azithromycin (ZITHROMAX) 250 MG tablet; Take 2 each day until finished. -     benzonatate (TESSALON) 200 MG capsule; Take 1 capsule (200 mg total) by mouth 2 (two) times daily as needed for cough. -     guaiFENesin (MUCINEX) 600 MG 12 hr tablet; Take 1 tablet (600 mg total) by mouth 2 (two) times daily.   I am having Andriea Tena start on azithromycin, benzonatate, and guaiFENesin. I am also having her maintain her ibuprofen, cetirizine, metFORMIN, ONE TOUCH ULTRA TEST, ONETOUCH VERIO IQ SYSTEM, glucose blood, insulin aspart, Insulin Pen Needle, insulin degludec, Empagliflozin-Linagliptin, and GLYXAMBI.  Meds ordered this encounter  Medications  . azithromycin (ZITHROMAX) 250 MG tablet    Sig: Take 2  each day until finished.    Dispense:  6 tablet    Refill:  0  . benzonatate (TESSALON) 200 MG capsule    Sig: Take 1 capsule (200 mg total) by mouth 2 (two) times daily as needed for cough.    Dispense:  20 capsule    Refill:  0  . guaiFENesin (MUCINEX) 600 MG 12 hr tablet    Sig: Take 1 tablet (600 mg total) by mouth 2 (two) times daily.    Dispense:  20 tablet    Refill:  1     Follow-up: Return in about 1 week (around 03/29/2017), or if symptoms worsen or fail to improve.  Libby Maw, MD

## 2017-03-27 ENCOUNTER — Telehealth: Payer: Self-pay | Admitting: Endocrinology

## 2017-03-27 NOTE — Telephone Encounter (Signed)
Pt has had a recent name change so all of the rx we call in need to be resubmitted with her new name. Please call into [facility] on [address].

## 2017-03-30 ENCOUNTER — Telehealth: Payer: Self-pay | Admitting: Endocrinology

## 2017-03-30 ENCOUNTER — Other Ambulatory Visit: Payer: Self-pay | Admitting: Endocrinology

## 2017-03-30 MED ORDER — LINAGLIPTIN 5 MG PO TABS: 5.0000 mg | ORAL_TABLET | Freq: Every day | ORAL | 3 refills | Status: DC

## 2017-03-30 MED ORDER — EMPAGLIFLOZIN 10 MG PO TABS: 10.0000 mg | ORAL_TABLET | Freq: Every day | ORAL | 3 refills | Status: DC

## 2017-03-30 NOTE — Telephone Encounter (Signed)
Patient stated that she need PA on all of her prescriptions, send to  [facility] # [address], [place] [phone] (Phone) [phone] (Fax)     she stated the pharmacy haven heard anything from our office, she has been out of her medication for a week. She is very upset, she ask to speak to the nurse  was not at her desk,  she said this should not be happening and should get her refills on time. I tried to help her the best way I can. She was not very nice about it. I asked her if she wanted to speak to our [employer], she said no, if the nurse is not calling her back, she will not expect to hear back from our office manger. She hung up on me.

## 2017-03-31 ENCOUNTER — Telehealth: Payer: Self-pay | Admitting: Endocrinology

## 2017-03-31 NOTE — Telephone Encounter (Signed)
Patient went to pick up Glyxambi (refill) but the pharmacy gave her Tradjenta. She wants to know why her medication was changed. Please call patient at [ph# [phone] Patient at the pharmacy right now

## 2017-04-03 NOTE — Telephone Encounter (Signed)
Please advise. I do not see a current not about Tradjenta.

## 2017-04-03 NOTE — Telephone Encounter (Signed)
Her insurance has denied her Glyxambi and she will now take [place] separately that are part of the [facility]

## 2017-04-03 NOTE — Telephone Encounter (Signed)
Pt states she has spoke with her insurance about an approved medication and would like a call back to give the medications that insurance told her would work   Please advise

## 2017-04-03 NOTE — Telephone Encounter (Signed)
Called patient and left a voice message to let her know to please call us back and give [place] her new name and the name of the prescriptions that she needs for [place] to resend with her new name.

## 2017-04-03 NOTE — Telephone Encounter (Signed)
Called patient and let her know the medication changes and she stated that she is not able to afford the Tradjenta which is over $500 and the Jardiance needed a PA. She is going to call her insurance to see what is approved and call back to let us know.

## 2017-04-03 NOTE — Telephone Encounter (Signed)
Patient stated that she needs a 90 day supply due to her new [facility] insurance and she wants her prescriptions to be sent to [facility] on [address].

## 2017-04-04 ENCOUNTER — Other Ambulatory Visit: Payer: Self-pay

## 2017-04-04 MED ORDER — INSULIN PEN NEEDLE 32G X 4 MM MISC: 4 refills | Status: DC

## 2017-04-04 MED ORDER — EMPAGLIFLOZIN-LINAGLIPTIN 10-5 MG PO TABS: 1.0000 | ORAL_TABLET | Freq: Every day | ORAL | 3 refills | Status: DC

## 2017-04-04 MED ORDER — INSULIN ASPART 100 UNIT/ML FLEXPEN: PEN_INJECTOR | SUBCUTANEOUS | 3 refills | Status: DC

## 2017-04-04 MED ORDER — GLUCOSE BLOOD VI STRP: ORAL_STRIP | 3 refills | Status: DC

## 2017-04-04 MED ORDER — METFORMIN HCL ER 500 MG PO TB24: 1000.0000 mg | ORAL_TABLET | Freq: Two times a day (BID) | ORAL | 3 refills | Status: DC

## 2017-04-04 MED ORDER — INSULIN DEGLUDEC 100 UNIT/ML ~~LOC~~ SOPN: PEN_INJECTOR | SUBCUTANEOUS | 3 refills | Status: DC

## 2017-04-04 NOTE — Telephone Encounter (Signed)
Patient called insurance and they stated that she needs a PA for [facility] that it is covered. Patient needs a 90 day prescription with 3 and to [facility] [address].  She also stated that she needs to get her other prescriptions such as Novolog, Tresiba, Metformin, and Onetouch test strips and she needs a 90 day prescription with refills.

## 2017-04-04 NOTE — Telephone Encounter (Signed)
Called patient insurance pharmacy number on the back and spoke to [place] to do the PA for her Glyxambi 10-5 and this has been approved through 04/04/2017 and the PA authorization number is # [id number].  I have [facility] this [facility] for [facility] along with the other medications she asked for and I also called her and left a voice message to let her know that this has been approved and that I have sent over the new prescriptions for her.

## 2017-04-05 ENCOUNTER — Telehealth: Payer: Self-pay | Admitting: Endocrinology

## 2017-04-05 NOTE — Telephone Encounter (Signed)
°   empagliflozin (JARDIANCE) 10 MG TABS tablet   Empagliflozin-Linagliptin (GLYXAMBI) 10-5 MG TABS   [employer] is needing PA for these. The pharmacy faxed over a paper for this and they wanted to know if we have received this.    [employer]  [phone] REF KEY- [id number]

## 2017-04-05 NOTE — Telephone Encounter (Signed)
Called [employer] and spoke to [place] and let her know that the Glyxambi 10-5 mg has been approved through 04/04/2018 and let her know that she will not need the Jardiance since she has the [facility] approved. I gave her the date and the approval authorization number.

## 2017-04-07 NOTE — Telephone Encounter (Signed)
See recent [facility] 04/05/17

## 2017-04-10 NOTE — Telephone Encounter (Signed)
Patient called and said her new last name is Encalade. Her maiden name was [place].

## 2017-04-12 ENCOUNTER — Telehealth: Payer: Self-pay

## 2017-04-12 MED ORDER — INSULIN GLARGINE 300 UNIT/ML ~~LOC~~ SOPN: 16.0000 [IU] | PEN_INJECTOR | Freq: Every day | SUBCUTANEOUS | 0 refills | Status: DC

## 2017-04-12 NOTE — Telephone Encounter (Signed)
Received fax for [employer]. Please advise.   KEY:[id number]

## 2017-04-12 NOTE — Telephone Encounter (Signed)
Yes for a PA and medication sent

## 2017-04-12 NOTE — Telephone Encounter (Signed)
Are you referring to a prior authorization?  May try sending same dose of Toujeo if this is a PA

## 2017-04-12 NOTE — Addendum Note (Signed)
Addended by: Drucilla Schmidt on: 04/12/2017 02:13 PM   Modules accepted: Orders

## 2017-04-13 NOTE — Telephone Encounter (Signed)
Per pt since Toujeo is also not covered she wants a PA done for [place]. Declines calling her insurance company. Asked that a PA be done for all her medications. Explained to patient we can not just do a PA without a message from the pharmacy or insurance company. She stated she understood. PA sent for [place] today.

## 2017-04-17 MED ORDER — INSULIN DEGLUDEC 100 UNIT/ML ~~LOC~~ SOPN: PEN_INJECTOR | SUBCUTANEOUS | 3 refills | Status: DC

## 2017-04-17 NOTE — Telephone Encounter (Signed)
Response for Anna Jennings states that NO PA is required because this is a covered drug. Resent to pharmacy with note

## 2017-04-17 NOTE — Addendum Note (Signed)
Addended by: Drucilla Schmidt on: 04/17/2017 01:22 PM   Modules accepted: Orders

## 2017-05-01 ENCOUNTER — Other Ambulatory Visit (INDEPENDENT_AMBULATORY_CARE_PROVIDER_SITE_OTHER): Payer: 59

## 2017-05-01 DIAGNOSIS — E119 Type 2 diabetes mellitus without complications: Secondary | ICD-10-CM

## 2017-05-01 LAB — COMPREHENSIVE METABOLIC PANEL
ALBUMIN: 3.8 g/dL (ref 3.5–5.2)
ALT: 14 U/L (ref 0–35)
AST: 17 U/L (ref 0–37)
Alkaline Phosphatase: 61 U/L (ref 39–117)
BUN: 18 mg/dL (ref 6–23)
CHLORIDE: 106 meq/L (ref 96–112)
CO2: 29 mEq/L (ref 19–32)
Calcium: 9.2 mg/dL (ref 8.4–10.5)
Creatinine, Ser: 0.61 mg/dL (ref 0.40–1.20)
GFR: 112.59 mL/min (ref >60.00)
Glucose, Bld: 95 mg/dL (ref 70–99)
POTASSIUM: 3.9 meq/L (ref 3.5–5.1)
SODIUM: 141 meq/L (ref 135–145)
Total Bilirubin: 0.4 mg/dL (ref 0.2–1.2)
Total Protein: 7.3 g/dL (ref 6.0–8.3)

## 2017-05-01 LAB — LIPID PANEL
Cholesterol: 174 mg/dL (ref 0–200)
HDL: 52.6 mg/dL (ref >39.00)
LDL Cholesterol: 106 mg/dL — ABNORMAL HIGH (ref 0–99)
NONHDL: 121.68
Total CHOL/HDL Ratio: 3
Triglycerides: 78 mg/dL (ref 0.0–149.0)
VLDL: 15.6 mg/dL (ref 0.0–40.0)

## 2017-05-01 LAB — HEMOGLOBIN A1C: HEMOGLOBIN A1C: 6 % (ref 4.6–6.5)

## 2017-05-01 LAB — MICROALBUMIN / CREATININE URINE RATIO
Creatinine,U: 134.5 mg/dL
Microalb Creat Ratio: 1.2 mg/g (ref 0.0–30.0)
Microalb, Ur: 1.7 mg/dL (ref 0.0–1.9)

## 2017-05-02 ENCOUNTER — Other Ambulatory Visit: Payer: PRIVATE HEALTH INSURANCE

## 2017-05-02 ENCOUNTER — Ambulatory Visit: Payer: PRIVATE HEALTH INSURANCE | Admitting: Endocrinology

## 2017-05-09 ENCOUNTER — Ambulatory Visit: Payer: PRIVATE HEALTH INSURANCE | Admitting: Endocrinology

## 2017-05-15 ENCOUNTER — Other Ambulatory Visit: Payer: Self-pay | Admitting: *Deleted

## 2017-05-15 MED ORDER — EMPAGLIFLOZIN-LINAGLIPTIN 10-5 MG PO TABS: 1.0000 | ORAL_TABLET | Freq: Every day | ORAL | 3 refills | Status: DC

## 2017-05-15 MED ORDER — METFORMIN HCL ER 500 MG PO TB24: 1000.0000 mg | ORAL_TABLET | Freq: Two times a day (BID) | ORAL | 3 refills | Status: DC

## 2017-05-16 ENCOUNTER — Encounter: Payer: Self-pay | Admitting: Endocrinology

## 2017-05-16 ENCOUNTER — Ambulatory Visit: Payer: 59 | Admitting: Endocrinology

## 2017-05-16 VITALS — BP 118/80 | HR 91 | Wt 137.0 lb

## 2017-05-16 DIAGNOSIS — E119 Type 2 diabetes mellitus without complications: Secondary | ICD-10-CM

## 2017-05-16 NOTE — Patient Instructions (Signed)
Cut back on cheese

## 2017-05-16 NOTE — Progress Notes (Signed)
Patient ID: Anna Jennings, female   DOB: Mar 02, 1970, 47 y.o.   MRN: [id number]   Reason for Appointment : Followup for Type 2 Diabetes  History of Present Illness          Diagnosis: Type 2 diabetes mellitus, date of diagnosis: 2007       Past history: She was initially diagnosed with a two-hour postprandial glucose of 218 in 08/2005 and baseline A1c of 5.7 Apparently she was not treated with oral hypoglycemic drugs initially and probably started on metformin in 2009. She did have diarrhea with the regular metformin but tolerated extended release preparation better with no side effects even on 2000 mg She thinks her blood sugars are fairly well controlled initially but a couple of years later with higher readings she was given Amaryl also. The dose of this was gradually increased Her A1c readings have been ranging from 6.5-7.2 in between 2011 and 2013 but no further followup done after 11/2011 She had been out of her medications prior to her initial visit here in 12/14 and her blood sugars were progressively higher.  She had side effects of  bloating and swelling with pioglitazone and her [name] was stopped Because of poor control and A1c of 8.5 in 3/15 she was started on Levemir insulin  Recent history:   INSULIN: Tresiba 16 units at bedtime.  Novolog before meals 1:15 carbohydrate ratio, usually 4-5 units at lunch and dinner   Oral hypoglycemic drugs the patient is taking are: Glyxambi 10/5,  metformin ER 2 g qd  A1c is still excellent at 6%  Current management, blood sugar patterns and problems identified with management:   She says that because of her not working currently she is more regular with her mealtimes, insulin administration and her being more active  However she did not bring her blood sugar for review and not clear what her patterns are  She however has gained some weight since her last visit  She was without her Glyxambi for couple of weeks because of  insurance difficulties but is taking this now, concerned about the cost  No hypoglycemia at this time since she is trying to be more accurate with her carbohydrate counting for insulin administration        Side effects from medications have been: Diarrhea from regular metformin   Glucose monitoring:  <1  times a day       Glucometer: One Touch ultra 2 .   Results by recall:Fasting usually about 100-110 and  After meals up to about 140   Previous readings  Mean values apply above for all meters except median for One Touch  PRE-MEAL Fasting Lunch Dinner Bedtime Overall  Glucose range: 96-1 64  117, 132  116, 88  79-1 52    Mean/median:     117     Glycemic control:  Lab Results  Component Value Date   HGBA1C 6.0 05/01/2017   HGBA1C 6.0 01/27/2017   HGBA1C 5.8 10/17/2016   Lab Results  Component Value Date   MICROALBUR 1.7 05/01/2017   LDLCALC 106 (H) 05/01/2017   CREATININE 0.61 05/01/2017   Lab Results  Component Value Date   FRUCTOSAMINE 248 09/05/2016   FRUCTOSAMINE 322 (H) 08/17/2015    Self-care: The diet that the patient has been following is: tries to limit portions, usually has some carbohydrate at every meal     Meals: 2 meals per day.  no breakfast, am snack, sandwich at lunch;  Some rice  at meals Occasionally which she thinks tends to raise her sugar for couple of days          Exercise: walking  regularly, counting steps and usually trying to go up to 10,000 steps a day  Dietician visit: Most recent:2011?                 Compliance with the medical regimen: Good   Weight history:  Wt Readings from Last 3 Encounters:  05/16/17 137 lb (62.1 kg)  03/22/17 137 lb 8 oz (62.4 kg)  01/30/17 135 lb (61.2 kg)    No visits with results within 1 Week(s) from this visit.  Latest known visit with results is:  Lab on 05/01/2017  Component Date Value Ref Range Status  . Cholesterol 05/01/2017 174  0 - 200 mg/dL Final   ATP III Classification       Desirable:   < 200 mg/dL               Borderline High:  200 - 239 mg/dL          High:  > = 240 mg/dL  . Triglycerides 05/01/2017 78.0  0.0 - 149.0 mg/dL Final   Normal:  <150 mg/dLBorderline High:  150 - 199 mg/dL  . HDL 05/01/2017 52.60  >39.00 mg/dL Final  . VLDL 05/01/2017 15.6  0.0 - 40.0 mg/dL Final  . LDL Cholesterol 05/01/2017 106* 0 - 99 mg/dL Final  . Total CHOL/HDL Ratio 05/01/2017 3   Final                  Men          Women1/2 Average Risk     3.4          3.3Average Risk          5.0          4.42X Average Risk          9.6          7.13X Average Risk          15.0          11.0                      . NonHDL 05/01/2017 121.68   Final   NOTE:  Non-HDL goal should be 30 mg/dL higher than patient's LDL goal (i.e. LDL goal of < 70 mg/dL, would have non-HDL goal of < 100 mg/dL)  . Microalb, Ur 05/01/2017 1.7  0.0 - 1.9 mg/dL Final  . Creatinine,U 05/01/2017 134.5  mg/dL Final  . Microalb Creat Ratio 05/01/2017 1.2  0.0 - 30.0 mg/g Final  . Sodium 05/01/2017 141  135 - 145 mEq/L Final  . Potassium 05/01/2017 3.9  3.5 - 5.1 mEq/L Final  . Chloride 05/01/2017 106  96 - 112 mEq/L Final  . CO2 05/01/2017 29  19 - 32 mEq/L Final  . Glucose, Bld 05/01/2017 95  70 - 99 mg/dL Final  . BUN 05/01/2017 18  6 - 23 mg/dL Final  . Creatinine, Ser 05/01/2017 0.61  0.40 - 1.20 mg/dL Final  . Total Bilirubin 05/01/2017 0.4  0.2 - 1.2 mg/dL Final  . Alkaline Phosphatase 05/01/2017 61  39 - 117 U/L Final  . AST 05/01/2017 17  0 - 37 U/L Final  . ALT 05/01/2017 14  0 - 35 U/L Final  . Total Protein 05/01/2017 7.3  6.0 - 8.3 g/dL Final  . Albumin 05/01/2017  3.8  3.5 - 5.2 g/dL Final  . Calcium 05/01/2017 9.2  8.4 - 10.5 mg/dL Final  . GFR 05/01/2017 112.59  >60.00 mL/min Final  . Hgb A1c MFr Bld 05/01/2017 6.0  4.6 - 6.5 % Final   Glycemic Control Guidelines for People with Diabetes:Non Diabetic:  <6%Goal of Therapy: <7%Additional Action Suggested:  >8%     Allergies as of 05/16/2017   No Known Allergies       Medication List        Accurate as of 05/16/17  9:24 PM. Always use your most recent med list.          cetirizine 10 MG tablet Commonly known as:  ZYRTEC Take 10 mg by mouth daily.   Empagliflozin-Linagliptin 10-5 MG Tabs Commonly known as:  GLYXAMBI Take 1 tablet by mouth daily.   guaiFENesin 600 MG 12 hr tablet Commonly known as:  MUCINEX Take 1 tablet (600 mg total) by mouth 2 (two) times daily.   ibuprofen 200 MG tablet Commonly known as:  ADVIL,MOTRIN Take 400 mg by mouth every 6 (six) hours as needed for headache, mild pain or moderate pain.   insulin aspart 100 UNIT/ML FlexPen Commonly known as:  NOVOLOG FLEXPEN INJECT 6 TO 8 UNITS SUBCUTANEOUSLY 3 TIMES A DAY   insulin degludec 100 UNIT/ML Sopn FlexTouch Pen Commonly known as:  TRESIBA FLEXTOUCH INJECT 16 UNITS INTO THE SKIN DAILY.   Insulin Pen Needle 32G X 4 MM Misc Commonly known as:  BD PEN NEEDLE NANO U/F USE ONE PEN NEEDLE 4 TIMES PER DAY TO INJECT INSULIN   metFORMIN 500 MG 24 hr tablet Commonly known as:  GLUCOPHAGE-XR Take 2 tablets (1,000 mg total) by mouth 2 (two) times daily.   ONETOUCH VERIO IQ SYSTEM w/Device Kit Use to test blood sugars 3 times daily.       Allergies:  No Known Allergies  Past Medical History:  Diagnosis Date  . Diabetes mellitus without complication [facility])     Past Surgical History:  Procedure Laterality Date  . APPENDECTOMY    . CHOLECYSTECTOMY    . PANCREAS SURGERY     Tumor head of pancreas, incompletely resected    Family History  Problem Relation Age of Onset  . Hyperlipidemia Father   . Hypertension Father   . Coronary artery disease Father   . Diabetes Brother   . Heart disease Paternal Grandfather     Social History:  reports that  has never smoked. she has never used smokeless tobacco. She reports that she drinks alcohol. She reports that she does not use drugs.    Review of Systems       Lipids: was on Lipitor  previously when she  stopped it because of generalized aches and pains which were significant  She  does have a fairly significant family history of CAD  She was taking pravastatin 20 mg and this was also stopped  in December 2017 because of aching  More recently she thinks she has not been watching her diet but eating more High fat meats such as bacon, more eggs and cheese since she has been home more often LDL has gone up over 100, she is still reluctant to try medications  LDL is as follows:  Lab Results  Component Value Date   CHOL 174 05/01/2017   HDL 52.60 05/01/2017   LDLCALC 106 (H) 05/01/2017   LDLDIRECT 134.4 08/19/2013   TRIG 78.0 05/01/2017   CHOLHDL 3 05/01/2017  She has had severe vitamin D deficiency diagnosed in 2009 with a level of 5.5. She is not taking her previous dose of vitamin D3 2000 units as she ran out   Lab Results  Component Value Date   VD25OH 41.60 09/05/2016   VD25OH 16.91 (L) 04/18/2016   VD25OH 26.03 (L) 09/28/2015   VD25OH 21.78 (L) 06/15/2015    She had fatigue and her B12 level was low previously at 143 She thinks she is taking her B12 supplement but has not had a follow-up from her PCP    Physical Examination:  BP 118/80 (BP Location: Left Arm, Patient Position: Sitting, Cuff Size: Normal)   Pulse 91   Wt 137 lb (62.1 kg)   SpO2 98%   BMI 27.67 kg/m     ASSESSMENT/PLAN:   Diabetes type 2 with BMI 27 See history of present illness for detailed discussion of current management, blood sugar patterns and problems identified   She has an A1c of 6% again She is doing fairly well with her low-dose basal bolus insulin regimen along with Glyxambi and metformin Also recently she has been more consistent with doing her mealtime insulin as directed with carbohydrate counting and injecting before eating However has gained weight from somewhat higher fat intake overall  unable to review  home glucose monitor today  She will continue the same regimen,  she can try to get a co-pay card for Glyxambi online   LIPIDS: Will need to cut back on high saturated fat foods on a list was given She thinks she will not tolerate statin drugs and does not want to try  Patient Instructions  Cut back on cheese      Elayne Snare 05/16/2017, 9:24 PM

## 2017-07-07 ENCOUNTER — Other Ambulatory Visit: Payer: Self-pay

## 2017-07-07 MED ORDER — INSULIN DEGLUDEC 100 UNIT/ML ~~LOC~~ SOPN: PEN_INJECTOR | SUBCUTANEOUS | 3 refills | Status: DC

## 2017-09-12 ENCOUNTER — Other Ambulatory Visit (INDEPENDENT_AMBULATORY_CARE_PROVIDER_SITE_OTHER): Payer: 59

## 2017-09-12 DIAGNOSIS — E119 Type 2 diabetes mellitus without complications: Secondary | ICD-10-CM

## 2017-09-12 LAB — BASIC METABOLIC PANEL
BUN: 19 mg/dL (ref 6–23)
CO2: 25 mEq/L (ref 19–32)
CREATININE: 0.63 mg/dL (ref 0.40–1.20)
Calcium: 8.6 mg/dL (ref 8.4–10.5)
Chloride: 104 mEq/L (ref 96–112)
GFR: 108.3 mL/min (ref >60.00)
Glucose, Bld: 119 mg/dL — ABNORMAL HIGH (ref 70–99)
Potassium: 3.5 mEq/L (ref 3.5–5.1)
Sodium: 137 mEq/L (ref 135–145)

## 2017-09-12 LAB — HEMOGLOBIN A1C: Hgb A1c MFr Bld: 6.2 % (ref 4.6–6.5)

## 2017-09-14 ENCOUNTER — Other Ambulatory Visit: Payer: 59

## 2017-09-17 NOTE — Progress Notes (Signed)
Patient ID: Anna Jennings, female   DOB: 06-24-1970, 47 y.o.   MRN: [id number]   Reason for Appointment : Followup for Type 2 Diabetes  History of Present Illness          Diagnosis: Type 2 diabetes mellitus, date of diagnosis: 2007       Past history: She was initially diagnosed with a two-hour postprandial glucose of 218 in 08/2005 and baseline A1c of 5.7 Apparently she was not treated with oral hypoglycemic drugs initially and probably started on metformin in 2009. She did have diarrhea with the regular metformin but tolerated extended release preparation better with no side effects even on 2000 mg She thinks her blood sugars are fairly well controlled initially but a couple of years later with higher readings she was given Amaryl also. The dose of this was gradually increased Her A1c readings have been ranging from 6.5-7.2 in between 2011 and 2013 but no further followup done after 11/2011 She had been out of her medications prior to her initial visit here in 12/14 and her blood sugars were progressively higher.  She had side effects of  bloating and swelling with pioglitazone and her [name] was stopped Because of poor control and A1c of 8.5 in 3/15 she was started on Levemir insulin  Recent history:   INSULIN: Tresiba 16 units at bedtime.  Novolog before meals 1:15 carbohydrate ratio, usually 4-5 units at lunch and occasionally at dinner   Oral hypoglycemic drugs the patient is taking are: Glyxambi 10/5,  metformin ER 2 g qd  A1c is still excellent at 6.2%  Current management, blood sugar patterns and problems identified with management:   She has not checked her blood sugars much at all and has only about 4 readings in the last month  She is generally trying to cut back on carbohydrates especially at suppertime  Although she has not checked her readings after lunch she is taking NovoLog consistently before eating at lunchtime  She has a couple of fasting readings  that are fairly good  Currently she has not done any exercise but her weight has been about the same        Side effects from medications have been: Diarrhea from regular metformin   Glucose monitoring:  <1  times a day       Glucometer: One Touch ultra 2 .   Has only 4 readings in the last month which are all near normal  Glycemic control:  Lab Results  Component Value Date   HGBA1C 6.2 09/12/2017   HGBA1C 6.0 05/01/2017   HGBA1C 6.0 01/27/2017   Lab Results  Component Value Date   MICROALBUR 1.7 05/01/2017   LDLCALC 106 (H) 05/01/2017   CREATININE 0.63 09/12/2017   Lab Results  Component Value Date   FRUCTOSAMINE 248 09/05/2016   FRUCTOSAMINE 322 (H) 08/17/2015    Self-care: The diet that the patient has been following is: tries to limit portions, usually has some carbohydrate at every meal     Meals: 2 meals per day.  no breakfast, am snack, sandwich at lunch;  Some rice at meals Occasionally which she thinks tends to raise her sugar for couple of days          Exercise: walking  irregularly, counting steps and usually trying to go up to 10,000 steps a day  Dietician visit: Most recent:2011?                 Compliance with the  medical regimen: Good   Weight history:  Wt Readings from Last 3 Encounters:  09/18/17 139 lb (63 kg)  05/16/17 137 lb (62.1 kg)  03/22/17 137 lb 8 oz (62.4 kg)    Lab on 09/12/2017  Component Date Value Ref Range Status  . Sodium 09/12/2017 137  135 - 145 mEq/L Final  . Potassium 09/12/2017 3.5  3.5 - 5.1 mEq/L Final  . Chloride 09/12/2017 104  96 - 112 mEq/L Final  . CO2 09/12/2017 25  19 - 32 mEq/L Final  . Glucose, Bld 09/12/2017 119* 70 - 99 mg/dL Final  . BUN 09/12/2017 19  6 - 23 mg/dL Final  . Creatinine, Ser 09/12/2017 0.63  0.40 - 1.20 mg/dL Final  . Calcium 09/12/2017 8.6  8.4 - 10.5 mg/dL Final  . GFR 09/12/2017 108.30  >60.00 mL/min Final  . Hgb A1c MFr Bld 09/12/2017 6.2  4.6 - 6.5 % Final   Glycemic Control Guidelines  for People with Diabetes:Non Diabetic:  <6%Goal of Therapy: <7%Additional Action Suggested:  >8%     Allergies as of 09/18/2017   No Known Allergies     Medication List        Accurate as of 09/18/17 10:05 AM. Always use your most recent med list.          cetirizine 10 MG tablet Commonly known as:  ZYRTEC Take 20 mg by mouth daily.   diphenhydrAMINE 25 MG tablet Commonly known as:  BENADRYL Take 25 mg by mouth at bedtime as needed.   Empagliflozin-linaGLIPtin 10-5 MG Tabs Commonly known as:  GLYXAMBI Take 1 tablet by mouth daily.   ibuprofen 200 MG tablet Commonly known as:  ADVIL,MOTRIN Take 400 mg by mouth every 6 (six) hours as needed for headache, mild pain or moderate pain.   insulin aspart 100 UNIT/ML FlexPen Commonly known as:  NOVOLOG FLEXPEN INJECT 6 TO 8 UNITS SUBCUTANEOUSLY 3 TIMES A DAY   insulin degludec 100 UNIT/ML Sopn FlexTouch Pen Commonly known as:  TRESIBA FLEXTOUCH INJECT 16 UNITS INTO THE SKIN DAILY.   Insulin Pen Needle 32G X 4 MM Misc Commonly known as:  BD PEN NEEDLE NANO U/F USE ONE PEN NEEDLE 4 TIMES PER DAY TO INJECT INSULIN   levocetirizine 5 MG tablet Commonly known as:  XYZAL Take 5 mg by mouth every evening.   metFORMIN 500 MG 24 hr tablet Commonly known as:  GLUCOPHAGE-XR Take 2 tablets (1,000 mg total) by mouth 2 (two) times daily.   ONETOUCH VERIO IQ SYSTEM w/Device Kit Use to test blood sugars 3 times daily.       Allergies:  No Known Allergies  Past Medical History:  Diagnosis Date  . Diabetes mellitus without complication [facility])     Past Surgical History:  Procedure Laterality Date  . APPENDECTOMY    . CHOLECYSTECTOMY    . PANCREAS SURGERY     Tumor head of pancreas, incompletely resected    Family History  Problem Relation Age of Onset  . Hyperlipidemia Father   . Hypertension Father   . Coronary artery disease Father   . Diabetes Brother   . Heart disease Paternal Grandfather     Social History:   reports that she has never smoked. She has never used smokeless tobacco. She reports that she drinks alcohol. She reports that she does not use drugs.    Review of Systems       Lipids: was on Lipitor  previously when she stopped it because of generalized  aches and pains which were significant  She  does have a fairly significant family history of CAD  She was taking pravastatin 20 mg and this was also stopped  in December 2017 because of aching  More recently she thinks she has not been watching her diet but eating more High fat meats such as bacon, more eggs and cheese since she has been home more often LDL has been just over 100, she is still reluctant to try medications  LDL is as follows:  Lab Results  Component Value Date   CHOL 174 05/01/2017   HDL 52.60 05/01/2017   LDLCALC 106 (H) 05/01/2017   LDLDIRECT 134.4 08/19/2013   TRIG 78.0 05/01/2017   CHOLHDL 3 05/01/2017      She has had severe vitamin D deficiency diagnosed in 2009 with a level of 5.5. She is taking her recommended dose of vitamin D3 2000 units     Lab Results  Component Value Date   VD25OH 41.60 09/05/2016   VD25OH 16.91 (L) 04/18/2016   VD25OH 26.03 (L) 09/28/2015   VD25OH 21.78 (L) 06/15/2015    She had fatigue and her B12 level was low previously at 143 She thinks she is taking her B12 supplement consistently    Physical Examination:  BP 102/68 (BP Location: Left Arm, Patient Position: Sitting, Cuff Size: Normal)   Pulse 90   Ht _0  (1.499 m)   Wt 139 lb (63 kg)   SpO2 98%   BMI 28.07 kg/m     ASSESSMENT/PLAN:   Diabetes type 2 with BMI 28 See history of present illness for detailed discussion of current management, blood sugar patterns and problems identified   She has an A1c of 6.2 and has consistently good control now  With her basal insulin her fasting readings are fairly good With not eating as much carbohydrate and generally watching her diet she is usually taking  coverage for her lunch only and has a little carbohydrate at other meals However has not check sugars much at all lately and reminded her to do so  Also discussed needing to exercise regularly, her weight is slowly coming up She will continue her basal insulin and her oral medications unchanged also   LIPIDS: Will need to recheck her labs in the next visit At the more she can take Zetia if she has consistently high readings, she is reluctant to take any statin drugs  There are no Patient Instructions on file for this visit.     Elayne Snare 09/18/2017, 10:05 AM

## 2017-09-18 ENCOUNTER — Encounter: Payer: Self-pay | Admitting: Endocrinology

## 2017-09-18 ENCOUNTER — Ambulatory Visit: Payer: 59 | Admitting: Endocrinology

## 2017-09-18 VITALS — BP 102/68 | HR 90 | Ht 59.0 in | Wt 139.0 lb

## 2017-09-18 DIAGNOSIS — E782 Mixed hyperlipidemia: Secondary | ICD-10-CM | POA: Diagnosis not present

## 2017-09-18 DIAGNOSIS — E119 Type 2 diabetes mellitus without complications: Secondary | ICD-10-CM

## 2017-09-18 NOTE — Patient Instructions (Addendum)
Take B 12 and D  Check blood sugars on waking up 2/7   Also check blood sugars about 2 hours after a meal and do this after different meals by rotation  Recommended blood sugar levels on waking up is 90-130 and about 2 hours after meal is 130-160  Please bring your blood sugar monitor to each visit, thank you  Exercise

## 2017-11-05 ENCOUNTER — Other Ambulatory Visit: Payer: Self-pay | Admitting: Endocrinology

## 2017-11-09 ENCOUNTER — Ambulatory Visit (INDEPENDENT_AMBULATORY_CARE_PROVIDER_SITE_OTHER): Payer: 59

## 2017-11-09 ENCOUNTER — Encounter: Payer: Self-pay | Admitting: Family Medicine

## 2017-11-09 ENCOUNTER — Ambulatory Visit: Payer: 59 | Admitting: Family Medicine

## 2017-11-09 VITALS — BP 104/72 | HR 71 | Temp 99.1°F | Ht 59.0 in | Wt 135.4 lb

## 2017-11-09 DIAGNOSIS — Z23 Encounter for immunization: Secondary | ICD-10-CM

## 2017-11-09 DIAGNOSIS — M7551 Bursitis of right shoulder: Secondary | ICD-10-CM | POA: Diagnosis not present

## 2017-11-09 MED ORDER — MELOXICAM 15 MG PO TABS: 15.0000 mg | ORAL_TABLET | Freq: Every day | ORAL | 0 refills | Status: DC

## 2017-11-09 NOTE — Progress Notes (Signed)
Subjective:  Patient ID: Anna Jennings, female    DOB: [birth year]  Age: 47 y.o. MRN: [id number]  CC: Shoulder Pain (3 weeks of bilateral shoulder pain)   HPI Anna Jennings presents for evaluation of a 3-week history of right greater than left shoulder pain.  Anna Jennings is right-hand dominant.  Anna Jennings denies a specific injury.  Anna Jennings denies increased over shoulder work or lifting either at home or with Anna Jennings employment.  Anna Jennings does have a tendency to sleep with Anna Jennings arms up over Anna Jennings head.  Anna Jennings has no abdominal pain nausea or vomiting.  Anna Jennings has no fever or chills.  Anna Jennings has no neck pain or stiffness.  Anna Jennings is been under great control.  Anna Jennings last hemoglobin A1c was 6.  Anna Jennings requests a flu vaccine today.  With regards to Anna Jennings pancreas issues Anna Jennings has been released by the general surgeon who had been following the issue.  Anna Jennings is considered cured at this point.  Outpatient Medications Prior to Visit  Medication Sig Dispense Refill  . Blood Glucose Monitoring Suppl (ONETOUCH VERIO IQ SYSTEM) w/Device KIT Use to test blood sugars 3 times daily. 1 kit 1  . cetirizine (ZYRTEC) 10 MG tablet Take 20 mg by mouth daily.     . Cetirizine HCl 10 MG CAPS Take by mouth.    . diphenhydrAMINE (BENADRYL) 12.5 MG/5ML elixir Take by mouth.    . diphenhydrAMINE (BENADRYL) 25 MG tablet Take 25 mg by mouth at bedtime as needed.    . Empagliflozin-Linagliptin (GLYXAMBI) 10-5 MG TABS Take 1 tablet by mouth daily. 90 tablet 3  . ibuprofen (ADVIL,MOTRIN) 200 MG tablet Take 400 mg by mouth every 6 (six) hours as needed for headache, mild pain or moderate pain.     Marland Kitchen insulin aspart (NOVOLOG FLEXPEN) 100 UNIT/ML FlexPen INJECT 6 TO 8 UNITS SUBCUTANEOUSLY 3 TIMES A DAY 45 mL 3  . insulin degludec (TRESIBA FLEXTOUCH) 100 UNIT/ML SOPN FlexTouch Pen INJECT 16 UNITS INTO THE SKIN DAILY. 45 mL 3  . Insulin Pen Needle (BD PEN NEEDLE NANO U/F) 32G X 4 MM MISC USE ONE PEN NEEDLE 4 TIMES PER DAY TO INJECT INSULIN 130 each 4  . metFORMIN  (GLUCOPHAGE-XR) 500 MG 24 hr tablet TAKE 2 TABLETS BY MOUTH TWO TIMES DAILY 360 tablet 1  . levocetirizine (XYZAL) 5 MG tablet Take 5 mg by mouth every evening.     No facility-administered medications prior to visit.     ROS Review of Systems  Constitutional: Negative for chills, fatigue, fever and unexpected weight change.  Respiratory: Negative.   Cardiovascular: Negative.   Gastrointestinal: Negative.   Endocrine: Negative for polyphagia and polyuria.  Musculoskeletal: Positive for arthralgias. Negative for neck pain and neck stiffness.  Allergic/Immunologic: Negative for immunocompromised state.  Neurological: Negative for weakness and numbness.  Hematological: Does not bruise/bleed easily.  Psychiatric/Behavioral: Negative.     Objective:  BP 104/72 (BP Location: Left Arm, Patient Position: Sitting, Cuff Size: Normal)   Pulse 71   Temp 99.1 F (37.3 C) (Oral)   Ht _0  (1.499 m)   Wt 135 lb 6.4 oz (61.4 kg)   SpO2 99%   BMI 27.35 kg/m   BP Readings from Last 3 Encounters:  11/09/17 104/72  09/18/17 102/68  05/16/17 118/80    Wt Readings from Last 3 Encounters:  11/09/17 135 lb 6.4 oz (61.4 kg)  09/18/17 139 lb (63 kg)  05/16/17 137 lb (62.1 kg)    Physical Exam  Constitutional: Anna Jennings is  oriented to person, place, and time. Anna Jennings appears well-developed and well-nourished. No distress.  HENT:  Head: Normocephalic.  Right Ear: External ear normal.  Left Ear: External ear normal.  Mouth/Throat: Oropharynx is clear and moist. No oropharyngeal exudate.  Eyes: Right eye exhibits no discharge. Left eye exhibits no discharge. No scleral icterus.  Neck: No JVD present. No tracheal deviation present.  Pulmonary/Chest: Effort normal.  Musculoskeletal:       Right shoulder: Anna Jennings exhibits tenderness. Anna Jennings exhibits normal range of motion, no bony tenderness, no swelling and no effusion.       Arms: Neurological: Anna Jennings is alert and oriented to person, place, and time.  Skin:  Skin is warm and dry. Anna Jennings is not diaphoretic.    Lab Results  Component Value Date   WBC 6.9 05/20/2016   HGB 11.1 (L) 05/20/2016   HCT 33.5 (L) 05/20/2016   PLT 278 05/20/2016   GLUCOSE 119 (H) 09/12/2017   CHOL 174 05/01/2017   TRIG 78.0 05/01/2017   HDL 52.60 05/01/2017   LDLDIRECT 134.4 08/19/2013   LDLCALC 106 (H) 05/01/2017   ALT 14 05/01/2017   AST 17 05/01/2017   NA 137 09/12/2017   K 3.5 09/12/2017   CL 104 09/12/2017   CREATININE 0.63 09/12/2017   BUN 19 09/12/2017   CO2 25 09/12/2017   TSH 2.04 01/27/2017   HGBA1C 6.2 09/12/2017   MICROALBUR 1.7 05/01/2017    Ct Abdomen Pelvis W Contrast  Result Date: 05/21/2016 CLINICAL DATA:  47 year old female with 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 Nausea vomiting. EXAM: CT ABDOMEN AND PELVIS WITH CONTRAST TECHNIQUE: Multidetector CT imaging of the abdomen and pelvis was performed using the standard protocol following bolus administration of intravenous contrast. CONTRAST:  13m ISOVUE-300 IOPAMIDOL (ISOVUE-300) INJECTION 61% COMPARISON:  Abdominal ultrasound dated 12/01/2014 FINDINGS: Lower chest: The visualized lung bases are clear. No intra-abdominal free air or free fluid. Hepatobiliary: Faint subcentimeter low attenuating focus in the left lobe of the liver (series 2, image 17) is not well characterized. There is mild periportal edema. Cholecystectomy. Pancreas: There is postsurgical changes of the pancreas. The body and tail of the pancreas are are not visualized, possibly surgically absent or atrophic. There is a 2.9 x 4.9 cm complex mass or collection in the region of the head and uncinate process of the pancreas. This collection contains low attenuating content with small pockets of air as well as curvilinear high attenuating material. Findings may represent postsurgical changes with packing and surgical material versus necrotic an infected debris.  There is associated inflammatory changes. Spleen: Normal in size without focal abnormality. Adrenals/Urinary Tract: The adrenal glands are unremarkable. There is no hydronephrosis on either side. There is symmetric uptake and excretion of contrast by kidneys bilaterally. The visualized ureters and urinary bladder appear unremarkable. Stomach/Bowel: There is severe distention of the stomach with oral content. There is inflammatory changes and thickening of the distal stomach with associated high-grade luminal narrowing of the gastric antrum and pylorus secondary to mass effect and inflammatory changes of the mass/collection of the head of the pancreas. Moderate amount of stool noted throughout the colon. There is no evidence of small-bowel obstruction. Normal appendix. Vascular/Lymphatic: The abdominal aorta and IVC appear unremarkable. The origins of the celiac axis, SMA, IMA appear patent. There is mild mass effect and narrowing of the porta splenic confluence as well as mild narrowing of the proximal portion of the main portal vein. The  SMV, splenic vein, and main portal vein however remain patent. There is no adenopathy. Reproductive: The uterus is retroverted. An intrauterine device is noted. Evaluation for the IUD positioning is somewhat limited on CT. The ovaries are grossly unremarkable. Other: Small fat containing umbilical hernia. Musculoskeletal: No acute or significant osseous findings. IMPRESSION: 1. Complex an inflamed mass/collection at the head of the pancreas as described may represent combination of postsurgical changes and necrotic tissue. Correlation with clinical exam and surgical history recommended. There is associated reactive inflammation and mass effect on the gastric antrum and pylorus with luminal narrowing of the distal stomach and a degree of gastric outlet obstruction. The stomach is distended with oral content. Patient may benefit from placement of an NG tube. 2. Mild narrowing of the  portasplenic confluence bite inflammatory changes of the head of the pancreas. 3. Subcentimeter left hepatic hypodense lesion is not well characterized. 4. No adenopathy lumbar 5 moderate colonic stool burden. No small bowel obstruction. Normal appendix. Electronically Signed   By: Anner Crete M.D.   On: 05/21/2016 00:39   Dg Abd Portable 1v  Result Date: 05/21/2016 CLINICAL DATA:  Nasogastric tube placement.  Initial encounter. EXAM: PORTABLE ABDOMEN - 1 VIEW COMPARISON:  CT of the abdomen and pelvis performed earlier today at 12:12 a.m. FINDINGS: The enteric tube is seen ending overlying the body of the stomach. The stomach is largely filled with solid material. The visualized bowel gas pattern is otherwise grossly unremarkable. No free intra-abdominal air is seen, though evaluation for free air is limited on a single supine view. An intrauterine device is noted overlying the mid pelvis. Contrast is seen within the bladder. No acute osseous abnormalities are seen. IMPRESSION: Enteric tube noted ending overlying the body of the stomach. The stomach is largely filled with solid material. Electronically Signed   By: Garald Balding M.D.   On: 05/21/2016 02:43    Assessment & Plan:   Anna Jennings was seen today for shoulder pain.  Diagnoses and all orders for this visit:  Bursitis of right shoulder -     meloxicam (MOBIC) 15 MG tablet; Take 1 tablet (15 mg total) by mouth daily. For 2 weeks with food and then as needed. -     DG Shoulder Right; Future  Need for influenza vaccination -     Flu Vaccine QUAD 6+ mos PF IM (Fluarix Quad PF)   I am having Anna Planck "[name]" start on meloxicam. I am also having Anna Jennings maintain Anna Jennings ibuprofen, cetirizine, ONETOUCH VERIO IQ SYSTEM, insulin aspart, Insulin Pen Needle, Empagliflozin-linaGLIPtin, insulin degludec, levocetirizine, diphenhydrAMINE, metFORMIN, Cetirizine HCl, and diphenhydrAMINE.  Meds ordered this encounter  Medications  . meloxicam (MOBIC)  15 MG tablet    Sig: Take 1 tablet (15 mg total) by mouth daily. For 2 weeks with food and then as needed.    Dispense:  30 tablet    Refill:  0     Follow-up: Return in about 1 month (around 12/09/2017).  Libby Maw, MD

## 2017-11-09 NOTE — Patient Instructions (Signed)
Shoulder Impingement Syndrome Shoulder impingement syndrome is a condition that causes pain when connective tissues (tendons) surrounding the shoulder joint become pinched. These tendons are part of the group of muscles and tissues that help to stabilize the shoulder (rotator cuff). Beneath the rotator cuff is a fluid-filled sac (bursa) that allows the muscles and tendons to glide smoothly. The bursa may become swollen or irritated (bursitis). Bursitis, swelling in the rotator cuff tendons, or both conditions can decrease how much space is under a bone in the shoulder joint (acromion), resulting in impingement. What are the causes? Shoulder impingement syndrome can be caused by bursitis or swelling of the rotator cuff tendons, which may result from:  Repetitive overhead arm movements.  Falling onto the shoulder.  Weakness in the shoulder muscles.  What increases the risk? You may be more likely to develop this condition if you are an athlete who participates in:  Sports that involve throwing, such as baseball.  Tennis.  Swimming.  Volleyball.  Some people are also more likely to develop impingement syndrome because of the shape of their acromion bone. What are the signs or symptoms? The main symptom of this condition is pain on the front or side of the shoulder. Pain may:  Get worse when lifting or raising the arm.  Get worse at night.  Wake you up from sleeping.  Feel sharp when the shoulder is moved, and then fade to an ache.  Other signs and symptoms may include:  Tenderness.  Stiffness.  Inability to raise the arm above shoulder level or behind the body.  Weakness.  How is this diagnosed? This condition may be diagnosed based on:  Your symptoms.  Your medical history.  A physical exam.  Imaging tests, such as: ? X-rays. ? MRI. ? Ultrasound.  How is this treated? Treatment for this condition may include:  Resting your shoulder and avoiding all  activities that cause pain or put stress on the shoulder.  Icing your shoulder.  NSAIDs to help reduce pain and swelling.  One or more injections of medicines to numb the area and reduce inflammation.  Physical therapy.  Surgery. This may be needed if nonsurgical treatments have not helped. Surgery may involve repairing the rotator cuff, reshaping the acromion, or removing the bursa.  Follow these instructions at home: Managing pain, stiffness, and swelling  If directed, apply ice to the injured area. ? Put ice in a plastic bag. ? Place a towel between your skin and the bag. ? Leave the ice on for 20 minutes, 2-3 times a day. Activity  Rest and return to your normal activities as told by your health care provider. Ask your health care provider what activities are safe for you.  Do exercises as told by your health care provider. General instructions  Do not use any tobacco products, including cigarettes, chewing tobacco, or e-cigarettes. Tobacco can delay healing. If you need help quitting, ask your health care provider.  Ask your health care provider when it is safe for you to drive.  Take over-the-counter and prescription medicines only as told by your health care provider.  Keep all follow-up visits as told by your health care provider. This is important. How is this prevented?  Give your body time to rest between periods of activity.  Be safe and responsible while being active to avoid falls.  Maintain physical fitness, including strength and flexibility. Contact a health care provider if:  Your symptoms have not improved after 1-2 months of treatment and   rest.  You cannot lift your arm away from your body. This information is not intended to replace advice given to you by your health care provider. Make sure you discuss any questions you have with your health care provider. Document Released: 02/14/2005 Document Revised: 10/22/2015 Document Reviewed:  01/17/2015 Elsevier Interactive Patient Education  2018 [employer].  Shoulder Exercises Ask your health care provider which exercises are safe for you. Do exercises exactly as told by your health care provider and adjust them as directed. It is normal to feel mild stretching, pulling, tightness, or discomfort as you do these exercises, but you should stop right away if you feel sudden pain or your pain gets worse.Do not begin these exercises until told by your health care provider. RANGE OF MOTION EXERCISES These exercises warm up your muscles and joints and improve the movement and flexibility of your shoulder. These exercises also help to relieve pain, numbness, and tingling. These exercises involve stretching your injured shoulder directly. Exercise A: Pendulum  1. Stand near a wall or a surface that you can hold onto for balance. 2. Bend at the waist and let your left / right arm hang straight down. Use your other arm to support you. Keep your back straight and do not lock your knees. 3. Relax your left / right arm and shoulder muscles, and move your hips and your trunk so your left / right arm swings freely. Your arm should swing because of the motion of your body, not because you are using your arm or shoulder muscles. 4. Keep moving your body so your arm swings in the following directions, as told by your health care provider: ? Side to side. ? Forward and backward. ? In clockwise and counterclockwise circles. 5. Continue each motion for __________ seconds, or for as long as told by your health care provider. 6. Slowly return to the starting position. Repeat __________ times. Complete this exercise __________ times a day. Exercise B:Flexion, Standing  1. Stand and hold a broomstick, a cane, or a similar object. Place your hands a little more than shoulder-width apart on the object. Your left / right hand should be palm-up, and your other hand should be palm-down. 2. Keep your elbow  straight and keep your shoulder muscles relaxed. Push the stick down with your healthy arm to raise your left / right arm in front of your body, and then over your head until you feel a stretch in your shoulder. ? Avoid shrugging your shoulder while you raise your arm. Keep your shoulder blade tucked down toward the middle of your back. 3. Hold for __________ seconds. 4. Slowly return to the starting position. Repeat __________ times. Complete this exercise __________ times a day. Exercise C: Abduction, Standing 1. Stand and hold a broomstick, a cane, or a similar object. Place your hands a little more than shoulder-width apart on the object. Your left / right hand should be palm-up, and your other hand should be palm-down. 2. While keeping your elbow straight and your shoulder muscles relaxed, push the stick across your body toward your left / right side. Raise your left / right arm to the side of your body and then over your head until you feel a stretch in your shoulder. ? Do not raise your arm above shoulder height, unless your health care provider tells you to do that. ? Avoid shrugging your shoulder while you raise your arm. Keep your shoulder blade tucked down toward the middle of your back. 3.  Hold for __________ seconds. 4. Slowly return to the starting position. Repeat __________ times. Complete this exercise __________ times a day. Exercise D:Internal Rotation  1. Place your left / right hand behind your back, palm-up. 2. Use your other hand to dangle an exercise band, a towel, or a similar object over your shoulder. Grasp the band with your left / right hand so you are holding onto both ends. 3. Gently pull up on the band until you feel a stretch in the front of your left / right shoulder. ? Avoid shrugging your shoulder while you raise your arm. Keep your shoulder blade tucked down toward the middle of your back. 4. Hold for __________ seconds. 5. Release the stretch by letting go  of the band and lowering your hands. Repeat __________ times. Complete this exercise __________ times a day. STRETCHING EXERCISES These exercises warm up your muscles and joints and improve the movement and flexibility of your shoulder. These exercises also help to relieve pain, numbness, and tingling. These exercises are done using your healthy shoulder to help stretch the muscles of your injured shoulder. Exercise E: Warehouse manager (External Rotation and Abduction)  1. Stand in a doorway with one of your feet slightly in front of the other. This is called a staggered stance. If you cannot reach your forearms to the door frame, stand facing a corner of a room. 2. Choose one of the following positions as told by your health care provider: ? Place your hands and forearms on the door frame above your head. ? Place your hands and forearms on the door frame at the height of your head. ? Place your hands on the door frame at the height of your elbows. 3. Slowly move your weight onto your front foot until you feel a stretch across your chest and in the front of your shoulders. Keep your head and chest upright and keep your abdominal muscles tight. 4. Hold for __________ seconds. 5. To release the stretch, shift your weight to your back foot. Repeat __________ times. Complete this stretch __________ times a day. Exercise F:Extension, Standing 1. Stand and hold a broomstick, a cane, or a similar object behind your back. ? Your hands should be a little wider than shoulder-width apart. ? Your palms should face away from your back. 2. Keeping your elbows straight and keeping your shoulder muscles relaxed, move the stick away from your body until you feel a stretch in your shoulder. ? Avoid shrugging your shoulders while you move the stick. Keep your shoulder blade tucked down toward the middle of your back. 3. Hold for __________ seconds. 4. Slowly return to the starting position. Repeat __________  times. Complete this exercise __________ times a day. STRENGTHENING EXERCISES These exercises build strength and endurance in your shoulder. Endurance is the ability to use your muscles for a long time, even after they get tired. Exercise G:External Rotation  1. Sit in a stable chair without armrests. 2. Secure an exercise band at elbow height on your left / right side. 3. Place a soft object, such as a folded towel or a small pillow, between your left / right upper arm and your body to move your elbow a few inches away (about 10 cm) from your side. 4. Hold the end of the band so it is tight and there is no slack. 5. Keeping your elbow pressed against the soft object, move your left / right forearm out, away from your abdomen. Keep your body steady so  only your forearm moves. 6. Hold for __________ seconds. 7. Slowly return to the starting position. Repeat __________ times. Complete this exercise __________ times a day. Exercise H:Shoulder Abduction  1. Sit in a stable chair without armrests, or stand. 2. Hold a __________ weight in your left / right hand, or hold an exercise band with both hands. 3. Start with your arms straight down and your left / right palm facing in, toward your body. 4. Slowly lift your left / right hand out to your side. Do not lift your hand above shoulder height unless your health care provider tells you that this is safe. ? Keep your arms straight. ? Avoid shrugging your shoulder while you do this movement. Keep your shoulder blade tucked down toward the middle of your back. 5. Hold for __________ seconds. 6. Slowly lower your arm, and return to the starting position. Repeat __________ times. Complete this exercise __________ times a day. Exercise I:Shoulder Extension 1. Sit in a stable chair without armrests, or stand. 2. Secure an exercise band to a stable object in front of you where it is at shoulder height. 3. Hold one end of the exercise band in each  hand. Your palms should face each other. 4. Straighten your elbows and lift your hands up to shoulder height. 5. Step back, away from the secured end of the exercise band, until the band is tight and there is no slack. 6. Squeeze your shoulder blades together as you pull your hands down to the sides of your thighs. Stop when your hands are straight down by your sides. Do not let your hands go behind your body. 7. Hold for __________ seconds. 8. Slowly return to the starting position. Repeat __________ times. Complete this exercise __________ times a day. Exercise J:Standing Shoulder Row 1. Sit in a stable chair without armrests, or stand. 2. Secure an exercise band to a stable object in front of you so it is at waist height. 3. Hold one end of the exercise band in each hand. Your palms should be in a thumbs-up position. 4. Bend each of your elbows to an "L" shape (about 90 degrees) and keep your upper arms at your sides. 5. Step back until the band is tight and there is no slack. 6. Slowly pull your elbows back behind you. 7. Hold for __________ seconds. 8. Slowly return to the starting position. Repeat __________ times. Complete this exercise __________ times a day. Exercise K:Shoulder Press-Ups  1. Sit in a stable chair that has armrests. Sit upright, with your feet flat on the floor. 2. Put your hands on the armrests so your elbows are bent and your fingers are pointing forward. Your hands should be about even with the sides of your body. 3. Push down on the armrests and use your arms to lift yourself off of the chair. Straighten your elbows and lift yourself up as much as you comfortably can. ? Move your shoulder blades down, and avoid letting your shoulders move up toward your ears. ? Keep your feet on the ground. As you get stronger, your feet should support less of your body weight as you lift yourself up. 4. Hold for __________ seconds. 5. Slowly lower yourself back into the  chair. Repeat __________ times. Complete this exercise __________ times a day. Exercise L: Wall Push-Ups  1. Stand so you are facing a stable wall. Your feet should be about one arm-length away from the wall. 2. Lean forward and place your palms on  the wall at shoulder height. 3. Keep your feet flat on the floor as you bend your elbows and lean forward toward the wall. 4. Hold for __________ seconds. 5. Straighten your elbows to push yourself back to the starting position. Repeat __________ times. Complete this exercise __________ times a day. This information is not intended to replace advice given to you by your health care provider. Make sure you discuss any questions you have with your health care provider. Document Released: 12/29/2004 Document Revised: 11/09/2015 Document Reviewed: 10/26/2014 Elsevier Interactive Patient Education  2018 [employer].

## 2017-11-10 ENCOUNTER — Ambulatory Visit: Payer: 59 | Admitting: Family Medicine

## 2017-11-23 ENCOUNTER — Other Ambulatory Visit: Payer: Self-pay | Admitting: Endocrinology

## 2017-12-04 ENCOUNTER — Other Ambulatory Visit: Payer: Self-pay

## 2017-12-04 DIAGNOSIS — M7551 Bursitis of right shoulder: Secondary | ICD-10-CM

## 2017-12-04 MED ORDER — MELOXICAM 15 MG PO TABS: 15.0000 mg | ORAL_TABLET | Freq: Every day | ORAL | 0 refills | Status: DC

## 2017-12-05 ENCOUNTER — Telehealth: Payer: Self-pay | Admitting: Endocrinology

## 2017-12-05 NOTE — Telephone Encounter (Signed)
Rx Glyxambi tab 10-5 mg--approved on [id number] to 2018-04-04.

## 2017-12-12 ENCOUNTER — Encounter: Payer: Self-pay | Admitting: Family Medicine

## 2017-12-12 ENCOUNTER — Ambulatory Visit: Payer: 59 | Admitting: Family Medicine

## 2017-12-12 VITALS — BP 120/70 | HR 82 | Temp 98.0°F | Ht 59.0 in | Wt 129.0 lb

## 2017-12-12 DIAGNOSIS — S46911D Strain of unspecified muscle, fascia and tendon at shoulder and upper arm level, right arm, subsequent encounter: Secondary | ICD-10-CM

## 2017-12-12 DIAGNOSIS — S46911A Strain of unspecified muscle, fascia and tendon at shoulder and upper arm level, right arm, initial encounter: Secondary | ICD-10-CM | POA: Insufficient documentation

## 2017-12-12 DIAGNOSIS — M7551 Bursitis of right shoulder: Secondary | ICD-10-CM | POA: Diagnosis not present

## 2017-12-12 MED ORDER — MELOXICAM 15 MG PO TABS: 15.0000 mg | ORAL_TABLET | Freq: Every day | ORAL | 1 refills | Status: DC

## 2017-12-12 NOTE — Progress Notes (Signed)
Subjective:  Patient ID: Anna Jennings, female    DOB: 1970-12-21  Age: 47 y.o. MRN: [id number]  CC: Follow-up   HPI Anna Jennings presents for follow-up of her right shoulder pain.  It has improved greatly and the meloxicam and exercises that seem to help.  She finished her last round of 2 weeks of meloxicam 5 days ago and the shoulder pain has reported returned to a much lesser extent.  She denies any over shoulder work or athletic activity that requires this.  Her husband has mentioned that she continues to sleep with her arm extended overhead at times.  She was tried hugging a pillow before she goes to bed at night with some success.  She asks about improving her posture.  Outpatient Medications Prior to Visit  Medication Sig Dispense Refill  . Blood Glucose Monitoring Suppl (ONETOUCH VERIO IQ SYSTEM) w/Device KIT Use to test blood sugars 3 times daily. 1 kit 1  . cetirizine (ZYRTEC) 10 MG tablet Take 20 mg by mouth daily.     . Cetirizine HCl 10 MG CAPS Take by mouth.    . diphenhydrAMINE (BENADRYL) 12.5 MG/5ML elixir Take by mouth.    . diphenhydrAMINE (BENADRYL) 25 MG tablet Take 25 mg by mouth at bedtime as needed.    Marland Kitchen GLYXAMBI 10-5 MG TABS TAKE 1 TABLET BY MOUTH ONCE DAILY 30 tablet 2  . Insulin Pen Needle (BD PEN NEEDLE NANO U/F) 32G X 4 MM MISC USE ONE PEN NEEDLE 4 TIMES PER DAY TO INJECT INSULIN 130 each 4  . levocetirizine (XYZAL) 5 MG tablet Take 5 mg by mouth every evening.    . metFORMIN (GLUCOPHAGE-XR) 500 MG 24 hr tablet TAKE 2 TABLETS BY MOUTH TWO TIMES DAILY (Patient taking differently: Take 500 mg by mouth 2 (two) times daily. ) 360 tablet 1  . ibuprofen (ADVIL,MOTRIN) 200 MG tablet Take 400 mg by mouth every 6 (six) hours as needed for headache, mild pain or moderate pain.     . meloxicam (MOBIC) 15 MG tablet Take 1 tablet (15 mg total) by mouth daily. For 2 weeks with food and then as needed. 30 tablet 0  . insulin aspart (NOVOLOG FLEXPEN) 100 UNIT/ML FlexPen  INJECT 6 TO 8 UNITS SUBCUTANEOUSLY 3 TIMES A DAY (Patient not taking: Reported on 12/12/2017) 45 mL 3  . insulin degludec (TRESIBA FLEXTOUCH) 100 UNIT/ML SOPN FlexTouch Pen INJECT 16 UNITS INTO THE SKIN DAILY. (Patient not taking: Reported on 12/12/2017) 45 mL 3  . Empagliflozin-Linagliptin (GLYXAMBI) 10-5 MG TABS Take 1 tablet by mouth daily. 90 tablet 3   No facility-administered medications prior to visit.     ROS Review of Systems  Objective:  BP 120/70   Pulse 82   Temp 98 F (36.7 C) (Oral)   Ht 4' 11"  (1.499 m)   Wt 129 lb (58.5 kg)   SpO2 100%   BMI 26.05 kg/m   BP Readings from Last 3 Encounters:  12/12/17 120/70  11/09/17 104/72  09/18/17 102/68    Wt Readings from Last 3 Encounters:  12/12/17 129 lb (58.5 kg)  11/09/17 135 lb 6.4 oz (61.4 kg)  09/18/17 139 lb (63 kg)    Physical Exam  Lab Results  Component Value Date   WBC 6.9 05/20/2016   HGB 11.1 (L) 05/20/2016   HCT 33.5 (L) 05/20/2016   PLT 278 05/20/2016   GLUCOSE 119 (H) 09/12/2017   CHOL 174 05/01/2017   TRIG 78.0 05/01/2017   HDL 52.60 05/01/2017  LDLDIRECT 134.4 08/19/2013   LDLCALC 106 (H) 05/01/2017   ALT 14 05/01/2017   AST 17 05/01/2017   NA 137 09/12/2017   K 3.5 09/12/2017   CL 104 09/12/2017   CREATININE 0.63 09/12/2017   BUN 19 09/12/2017   CO2 25 09/12/2017   TSH 2.04 01/27/2017   HGBA1C 6.2 09/12/2017   MICROALBUR 1.7 05/01/2017    Ct Abdomen Pelvis W Contrast  Result Date: 05/21/2016 CLINICAL DATA:  47 year old female with 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 Nausea vomiting. EXAM: CT ABDOMEN AND PELVIS WITH CONTRAST TECHNIQUE: Multidetector CT imaging of the abdomen and pelvis was performed using the standard protocol following bolus administration of intravenous contrast. CONTRAST:  135m ISOVUE-300 IOPAMIDOL (ISOVUE-300) INJECTION 61% COMPARISON:  Abdominal ultrasound dated 12/01/2014 FINDINGS:  Lower chest: The visualized lung bases are clear. No intra-abdominal free air or free fluid. Hepatobiliary: Faint subcentimeter low attenuating focus in the left lobe of the liver (series 2, image 17) is not well characterized. There is mild periportal edema. Cholecystectomy. Pancreas: There is postsurgical changes of the pancreas. The body and tail of the pancreas are are not visualized, possibly surgically absent or atrophic. There is a 2.9 x 4.9 cm complex mass or collection in the region of the head and uncinate process of the pancreas. This collection contains low attenuating content with small pockets of air as well as curvilinear high attenuating material. Findings may represent postsurgical changes with packing and surgical material versus necrotic an infected debris. There is associated inflammatory changes. Spleen: Normal in size without focal abnormality. Adrenals/Urinary Tract: The adrenal glands are unremarkable. There is no hydronephrosis on either side. There is symmetric uptake and excretion of contrast by kidneys bilaterally. The visualized ureters and urinary bladder appear unremarkable. Stomach/Bowel: There is severe distention of the stomach with oral content. There is inflammatory changes and thickening of the distal stomach with associated high-grade luminal narrowing of the gastric antrum and pylorus secondary to mass effect and inflammatory changes of the mass/collection of the head of the pancreas. Moderate amount of stool noted throughout the colon. There is no evidence of small-bowel obstruction. Normal appendix. Vascular/Lymphatic: The abdominal aorta and IVC appear unremarkable. The origins of the celiac axis, SMA, IMA appear patent. There is mild mass effect and narrowing of the porta splenic confluence as well as mild narrowing of the proximal portion of the main portal vein. The SMV, splenic vein, and main portal vein however remain patent. There is no adenopathy. Reproductive: The  uterus is retroverted. An intrauterine device is noted. Evaluation for the IUD positioning is somewhat limited on CT. The ovaries are grossly unremarkable. Other: Small fat containing umbilical hernia. Musculoskeletal: No acute or significant osseous findings. IMPRESSION: 1. Complex an inflamed mass/collection at the head of the pancreas as described may represent combination of postsurgical changes and necrotic tissue. Correlation with clinical exam and surgical history recommended. There is associated reactive inflammation and mass effect on the gastric antrum and pylorus with luminal narrowing of the distal stomach and a degree of gastric outlet obstruction. The stomach is distended with oral content. Patient may benefit from placement of an NG tube. 2. Mild narrowing of the portasplenic confluence bite inflammatory changes of the head of the pancreas. 3. Subcentimeter left hepatic hypodense lesion is not well characterized. 4. No adenopathy lumbar 5 moderate colonic stool burden. No small bowel obstruction. Normal appendix. Electronically Signed   By: ALaren EvertsD.  On: 05/21/2016 00:39   Dg Abd Portable 1v  Result Date: 05/21/2016 CLINICAL DATA:  Nasogastric tube placement.  Initial encounter. EXAM: PORTABLE ABDOMEN - 1 VIEW COMPARISON:  CT of the abdomen and pelvis performed earlier today at 12:12 a.m. FINDINGS: The enteric tube is seen ending overlying the body of the stomach. The stomach is largely filled with solid material. The visualized bowel gas pattern is otherwise grossly unremarkable. No free intra-abdominal air is seen, though evaluation for free air is limited on a single supine view. An intrauterine device is noted overlying the mid pelvis. Contrast is seen within the bladder. No acute osseous abnormalities are seen. IMPRESSION: Enteric tube noted ending overlying the body of the stomach. The stomach is largely filled with solid material. Electronically Signed   By: Garald Balding  M.D.   On: 05/21/2016 02:43    Assessment & Plan:   Anna Jennings was seen today for follow-up.  Diagnoses and all orders for this visit:  Strain of right shoulder, subsequent encounter  Bursitis of right shoulder -     meloxicam (MOBIC) 15 MG tablet; Take 1 tablet (15 mg total) by mouth daily. For 2 weeks with food and then as needed.   I have discontinued Anna Jennings ibuprofen and Empagliflozin-linaGLIPtin. I am also having her maintain her cetirizine, ONETOUCH VERIO IQ SYSTEM, insulin aspart, Insulin Pen Needle, insulin degludec, levocetirizine, diphenhydrAMINE, metFORMIN, Cetirizine HCl, diphenhydrAMINE, GLYXAMBI, and meloxicam.  Meds ordered this encounter  Medications  . meloxicam (MOBIC) 15 MG tablet    Sig: Take 1 tablet (15 mg total) by mouth daily. For 2 weeks with food and then as needed.    Dispense:  30 tablet    Refill:  1   Patient feels as though the 2-week courses of meloxicam really help her shoulder.  She continues to sleep with her arm up overhead and this is a difficult habit to break.  We discussed referring on to [facility] for shoulder does not improve with another round of meloxicam and continued exercises.  She did ask about how to improve her posture and I suggested upper body strengthening and yoga.  Gave her information from up-to-date on how to improve her posture and strength as well.  Follow-up: Return if symptoms worsen or fail to improve, for orthopedic referral if not improving.  Libby Maw, MD

## 2018-02-02 ENCOUNTER — Other Ambulatory Visit: Payer: 59

## 2018-02-05 ENCOUNTER — Ambulatory Visit: Payer: 59 | Admitting: Endocrinology

## 2018-03-02 ENCOUNTER — Other Ambulatory Visit (INDEPENDENT_AMBULATORY_CARE_PROVIDER_SITE_OTHER): Payer: 59

## 2018-03-02 ENCOUNTER — Other Ambulatory Visit: Payer: Self-pay | Admitting: Endocrinology

## 2018-03-02 DIAGNOSIS — E559 Vitamin D deficiency, unspecified: Secondary | ICD-10-CM

## 2018-03-02 DIAGNOSIS — E782 Mixed hyperlipidemia: Secondary | ICD-10-CM | POA: Diagnosis not present

## 2018-03-02 DIAGNOSIS — E538 Deficiency of other specified B group vitamins: Secondary | ICD-10-CM | POA: Diagnosis not present

## 2018-03-02 DIAGNOSIS — E119 Type 2 diabetes mellitus without complications: Secondary | ICD-10-CM

## 2018-03-02 LAB — COMPREHENSIVE METABOLIC PANEL
ALT: 11 U/L (ref 0–35)
AST: 14 U/L (ref 0–37)
Albumin: 4 g/dL (ref 3.5–5.2)
Alkaline Phosphatase: 82 U/L (ref 39–117)
BILIRUBIN TOTAL: 0.3 mg/dL (ref 0.2–1.2)
BUN: 22 mg/dL (ref 6–23)
CO2: 27 meq/L (ref 19–32)
CREATININE: 0.57 mg/dL (ref 0.40–1.20)
Calcium: 9 mg/dL (ref 8.4–10.5)
Chloride: 105 mEq/L (ref 96–112)
GFR: 121.31 mL/min (ref >60.00)
GLUCOSE: 101 mg/dL — AB (ref 70–99)
Potassium: 4.3 mEq/L (ref 3.5–5.1)
SODIUM: 140 meq/L (ref 135–145)
Total Protein: 6.5 g/dL (ref 6.0–8.3)

## 2018-03-02 LAB — LIPID PANEL
CHOL/HDL RATIO: 3
Cholesterol: 183 mg/dL (ref 0–200)
HDL: 61.2 mg/dL (ref >39.00)
LDL Cholesterol: 95 mg/dL (ref 0–99)
NONHDL: 121.59
Triglycerides: 132 mg/dL (ref 0.0–149.0)
VLDL: 26.4 mg/dL (ref 0.0–40.0)

## 2018-03-02 LAB — VITAMIN B12: Vitamin B-12: >1500 pg/mL — ABNORMAL HIGH (ref 211–911)

## 2018-03-02 LAB — HEMOGLOBIN A1C: Hgb A1c MFr Bld: 5.6 % (ref 4.6–6.5)

## 2018-03-02 LAB — VITAMIN D 25 HYDROXY (VIT D DEFICIENCY, FRACTURES): VITD: 51.61 ng/mL (ref 30.00–100.00)

## 2018-03-05 ENCOUNTER — Ambulatory Visit: Payer: 59 | Admitting: Endocrinology

## 2018-03-05 ENCOUNTER — Encounter: Payer: Self-pay | Admitting: Endocrinology

## 2018-03-05 VITALS — BP 118/84 | HR 86 | Ht 59.0 in | Wt 123.4 lb

## 2018-03-05 DIAGNOSIS — E782 Mixed hyperlipidemia: Secondary | ICD-10-CM

## 2018-03-05 DIAGNOSIS — E559 Vitamin D deficiency, unspecified: Secondary | ICD-10-CM | POA: Diagnosis not present

## 2018-03-05 DIAGNOSIS — E119 Type 2 diabetes mellitus without complications: Secondary | ICD-10-CM

## 2018-03-05 NOTE — Patient Instructions (Signed)
B 12, 1x daily

## 2018-03-05 NOTE — Progress Notes (Signed)
Patient ID: Anna Jennings, female   DOB: February 16, 1971, 48 y.o.   MRN: [id number]   Reason for Appointment : Followup for Type 2 Diabetes  History of Present Illness          Diagnosis: Type 2 diabetes mellitus, date of diagnosis: 2007       Past history: She was initially diagnosed with a two-hour postprandial glucose of 218 in 08/2005 and baseline A1c of 5.7 Apparently she was not treated with oral hypoglycemic drugs initially and probably started on metformin in 2009. She did have diarrhea with the regular metformin but tolerated extended release preparation better with no side effects even on 2000 mg She thinks her blood sugars are fairly well controlled initially but a couple of years later with higher readings she was given Amaryl also. The dose of this was gradually increased Her A1c readings have been ranging from 6.5-7.2 in between 2011 and 2013 but no further followup done after 11/2011 She had been out of her medications prior to her initial visit here in 12/14 and her blood sugars were progressively higher.  She had side effects of  bloating and swelling with pioglitazone and her [name] was stopped Because of poor control and A1c of 8.5 in 3/15 she was started on Levemir insulin  Recent history:   INSULIN: None recently  Oral hypoglycemic drugs the patient is taking are: Glyxambi 10/5,  metformin ER 1.5 g qd  A1c is improved down to 5.6 compared to 6.2 She has not been seen in follow-up since 7/19  Current management, blood sugar patterns from her Dexcom download and problems identified are:   She was given a promotion of the Dexcom sensor last October by her insurance company and she has been using this  She is using this about 75% of the time  She was already improving her diet on her last visit in July but she was able to cut back her insulin and tapered off in October apparently without higher blood sugars in the mornings  With this she is trying to adjust  her diet to eliminate foods that make her sugars go up such as rice, peanuts and other foods  Is trying to eat low carbohydrate meals most of the time and limiting them to about 50-60 g/day, she does not think she can restrict it further and does not like higher fat intake either  She is also very consistent with exercising more recently mostly with walking,: She thinks blood sugars may go up to about 180 if she runs  She has lost weight  She took [place] 12 units over the holidays when her sugars were higher and she was not watching her diet  Still has occasional mild spikes in blood sugars after some meals, occasionally over 180  Recently with eating pancakes her blood sugar was over 250  On her own she has cut back her metformin to 3 tablets as she wanted to cut back on her medication on her own        Side effects from medications have been: Diarrhea from regular metformin   Glucose monitoring:        Glucometer:  Currently Dexcom, was on One Touch ultra 2 .   CGM use % of time  75  2-week average/SD  126+/-27  Time in range  94     %  % Time Above 180  5  % Time above 250  0.1  % Time Below 70  1.4  PRE-MEAL Fasting Lunch Dinner Bedtime Overall  Glucose range:       Averages:  117       POST-MEAL PC Breakfast PC Lunch PC Dinner  Glucose range:     Averages:  130  129  145       Glycemic control:  Lab Results  Component Value Date   HGBA1C 5.6 03/02/2018   HGBA1C 6.2 09/12/2017   HGBA1C 6.0 05/01/2017   Lab Results  Component Value Date   MICROALBUR 1.7 05/01/2017   LDLCALC 95 03/02/2018   CREATININE 0.57 03/02/2018   Lab Results  Component Value Date   FRUCTOSAMINE 248 09/05/2016   FRUCTOSAMINE 322 (H) 08/17/2015    Self-care: The diet that the patient has been following is: tries to limit portions, usually has some carbohydrate at every meal     Meals: 2 meals per day.  no breakfast, am snack, sandwich at lunch;  Some rice at meals Occasionally  which she thinks tends to raise her sugar for couple of days          Exercise: walking  regularly, counting steps and usually trying to go up to 10,000 steps a day  Dietician visit: Most recent:2011?                 Compliance with the medical regimen: Good   Weight history:  Wt Readings from Last 3 Encounters:  03/05/18 123 lb 6.4 oz (56 kg)  12/12/17 129 lb (58.5 kg)  11/09/17 135 lb 6.4 oz (61.4 kg)    Lab on 03/02/2018  Component Date Value Ref Range Status  . Cholesterol 03/02/2018 183  0 - 200 mg/dL Final   ATP III Classification       Desirable:  < 200 mg/dL               Borderline High:  200 - 239 mg/dL          High:  > = 240 mg/dL  . Triglycerides 03/02/2018 132.0  0.0 - 149.0 mg/dL Final   Normal:  <150 mg/dLBorderline High:  150 - 199 mg/dL  . HDL 03/02/2018 61.20  >39.00 mg/dL Final  . VLDL 03/02/2018 26.4  0.0 - 40.0 mg/dL Final  . LDL Cholesterol 03/02/2018 95  0 - 99 mg/dL Final  . Total CHOL/HDL Ratio 03/02/2018 3   Final                  Men          Women1/2 Average Risk     3.4          3.3Average Risk          5.0          4.42X Average Risk          9.6          7.13X Average Risk          15.0          11.0                      . NonHDL 03/02/2018 121.59   Final   NOTE:  Non-HDL goal should be 30 mg/dL higher than patient's LDL goal (i.e. LDL goal of < 70 mg/dL, would have non-HDL goal of < 100 mg/dL)  . Sodium 03/02/2018 140  135 - 145 mEq/L Final  . Potassium 03/02/2018 4.3  3.5 - 5.1 mEq/L Final  . Chloride 03/02/2018 105  96 - 112 mEq/L Final  . CO2 03/02/2018 27  19 - 32 mEq/L Final  . Glucose, Bld 03/02/2018 101* 70 - 99 mg/dL Final  . BUN 03/02/2018 22  6 - 23 mg/dL Final  . Creatinine, Ser 03/02/2018 0.57  0.40 - 1.20 mg/dL Final  . Total Bilirubin 03/02/2018 0.3  0.2 - 1.2 mg/dL Final  . Alkaline Phosphatase 03/02/2018 82  39 - 117 U/L Final  . AST 03/02/2018 14  0 - 37 U/L Final  . ALT 03/02/2018 11  0 - 35 U/L Final  . Total Protein  03/02/2018 6.5  6.0 - 8.3 g/dL Final  . Albumin 03/02/2018 4.0  3.5 - 5.2 g/dL Final  . Calcium 03/02/2018 9.0  8.4 - 10.5 mg/dL Final  . GFR 03/02/2018 121.31  >60.00 mL/min Final  . Hgb A1c MFr Bld 03/02/2018 5.6  4.6 - 6.5 % Final   Glycemic Control Guidelines for People with Diabetes:Non Diabetic:  <6%Goal of Therapy: <7%Additional Action Suggested:  >8%   . VITD 03/02/2018 51.61  30.00 - 100.00 ng/mL Final  . Vitamin B-12 03/02/2018 >1500* 211 - 911 pg/mL Final    Allergies as of 03/05/2018      Reactions   Sulfa Antibiotics Nausea Only   Still able to take but makes nausea      Medication List       Accurate as of March 05, 2018  8:43 PM. Always use your most recent med list.        cetirizine 10 MG tablet Commonly known as:  ZYRTEC Take 20 mg by mouth daily.   diphenhydrAMINE 25 MG tablet Commonly known as:  BENADRYL Take 25 mg by mouth at bedtime as needed.   GLYXAMBI 10-5 MG Tabs Generic drug:  Empagliflozin-linaGLIPtin TAKE 1 TABLET BY MOUTH ONCE DAILY   Insulin Pen Needle 32G X 4 MM Misc Commonly known as:  BD PEN NEEDLE NANO U/F USE ONE PEN NEEDLE 4 TIMES PER DAY TO INJECT INSULIN   levocetirizine 5 MG tablet Commonly known as:  XYZAL Take 5 mg by mouth every evening.   meloxicam 15 MG tablet Commonly known as:  MOBIC Take 1 tablet (15 mg total) by mouth daily. For 2 weeks with food and then as needed.   metFORMIN 500 MG 24 hr tablet Commonly known as:  GLUCOPHAGE-XR TAKE 2 TABLETS BY MOUTH TWO TIMES DAILY   ONETOUCH VERIO IQ SYSTEM w/Device Kit Use to test blood sugars 3 times daily.       Allergies:  Allergies  Allergen Reactions  . Sulfa Antibiotics Nausea Only    Still able to take but makes nausea    Past Medical History:  Diagnosis Date  . Diabetes mellitus without complication [facility])     Past Surgical History:  Procedure Laterality Date  . APPENDECTOMY    . CHOLECYSTECTOMY    . PANCREAS SURGERY     Tumor head of pancreas,  incompletely resected    Family History  Problem Relation Age of Onset  . Hyperlipidemia Father   . Hypertension Father   . Coronary artery disease Father   . Diabetes Brother   . Heart disease Paternal Grandfather     Social History:  reports that she has never smoked. She has never used smokeless tobacco. She reports current alcohol use. She reports that she does not use drugs.    Review of Systems       Lipids: was on Lipitor  previously when she stopped it because  of generalized aches and pains which were significant  She  does have a fairly significant family history of CAD She was taking pravastatin 20 mg and this was also stopped  in December 2017 because of aching  LDL has been just over 100, now improved She thinks she is trying to eat healthy meals more recently  LDL is as follows:  Lab Results  Component Value Date   CHOL 183 03/02/2018   HDL 61.20 03/02/2018   LDLCALC 95 03/02/2018   LDLDIRECT 134.4 08/19/2013   TRIG 132.0 03/02/2018   CHOLHDL 3 03/02/2018      She has had severe vitamin D deficiency diagnosed in 2009 with a level of 5.5. She is taking her recommended dose of vitamin D3 2000 units with adequate levels   Lab Results  Component Value Date   VD25OH 51.61 03/02/2018   VD25OH 41.60 09/05/2016   VD25OH 16.91 (L) 04/18/2016   VD25OH 26.03 (L) 09/28/2015    She had fatigue and her B12 level was low previously at 143  She thinks she is taking her B12 supplement twice a day but not clear what strength  Lab Results  Component Value Date   VITAMINB12 >1500 (H) 03/02/2018    She says she is taking 1 tablet of magnesium daily on the advice of her friend to help her with her constipation and she thinks now she is having a bowel movement after every meal but prefers this to constipation  Physical Examination:  BP 118/84 (BP Location: Left Arm, Patient Position: Sitting, Cuff Size: Normal)   Pulse 86   Ht _0  (1.499 m)   Wt 123 lb 6.4 oz  (56 kg)   SpO2 99%   BMI 24.92 kg/m     ASSESSMENT/PLAN:   Diabetes type 2 with BMI 25  See history of present illness for detailed discussion of current management, blood sugar patterns and problems identified   She has an improved A1c of 5.6 compared to 6.2 and has consistently good control now  She has been able to stop her insulin with her improved diet and weight loss Also appears to be modifying her diet consistently by using the Dexcom sensor that has been apparently approved by her insurance She will have some higher postprandial readings when she is getting more carbohydrate but is getting good control with 1500 mg of metformin and Glyxambi She is also benefiting from regular exercise She will continue the same regimen and advised her to stay on her medications  Given her the option of taking 2 to 4 units of NovoLog if she is having a high carbohydrate meal since her blood sugars may be over 622 with certain meals If she is able to continue with her Dexcom sensor this will help her be alerted to any abnormal patterns and she will call if she needs to go back on insulin consistently Discussed blood sugar targets at various time Reviewed her CGM download and patterns with the patient  Recommended staying on metformin for improved insulin sensitivity   LIPIDS: LDL below 100 and will continue to monitor on diet alone since she has been intolerant to statins previously  B12 deficiency: She can reduce her supplement to once a day because of high level  Vitamin D deficiency: Appears adequately controlled and she will continue the same regimen  Patient Instructions  B 12, 1x daily    Follow-up in 4 months  Lucia Mccreadie 03/05/2018, 8:43 PM   Total visit time for evaluation  and management of multiple problems and counseling =25 minutes

## 2018-03-27 ENCOUNTER — Telehealth: Payer: Self-pay

## 2018-03-27 NOTE — Telephone Encounter (Signed)
PA initiated via [email or web address] for Glyxambi 10-5mg  tablets, take 1 tablet by mouth once daily. [id number] PA Case ID: [id number]

## 2018-03-28 ENCOUNTER — Telehealth: Payer: Self-pay

## 2018-03-28 NOTE — Telephone Encounter (Signed)
glyxambi approved 03/27/2018-03/28/2019 referenc# [id number]

## 2018-03-28 NOTE — Telephone Encounter (Signed)
Received email from [email or web address] stating that the patient has been approved for Glyxambi 10-5mg  tablets, take 1 tablet by mouth once daily.  Approval is good from 03/26/2018 through 03/28/2019.

## 2018-04-20 ENCOUNTER — Other Ambulatory Visit: Payer: Self-pay | Admitting: Endocrinology

## 2018-05-10 DIAGNOSIS — K869 Disease of pancreas, unspecified: Secondary | ICD-10-CM | POA: Diagnosis not present

## 2018-05-10 DIAGNOSIS — K56609 Unspecified intestinal obstruction, unspecified as to partial versus complete obstruction: Secondary | ICD-10-CM | POA: Diagnosis not present

## 2018-05-18 DIAGNOSIS — Z09 Encounter for follow-up examination after completed treatment for conditions other than malignant neoplasm: Secondary | ICD-10-CM | POA: Diagnosis not present

## 2018-05-18 DIAGNOSIS — K56600 Partial intestinal obstruction, unspecified as to cause: Secondary | ICD-10-CM | POA: Diagnosis not present

## 2018-06-04 ENCOUNTER — Other Ambulatory Visit: Payer: Self-pay | Admitting: Endocrinology

## 2018-07-02 ENCOUNTER — Other Ambulatory Visit: Payer: Self-pay

## 2018-07-02 ENCOUNTER — Other Ambulatory Visit (INDEPENDENT_AMBULATORY_CARE_PROVIDER_SITE_OTHER): Payer: 59

## 2018-07-02 DIAGNOSIS — E119 Type 2 diabetes mellitus without complications: Secondary | ICD-10-CM | POA: Diagnosis not present

## 2018-07-02 LAB — BASIC METABOLIC PANEL
BUN: 16 mg/dL (ref 6–23)
CO2: 25 mEq/L (ref 19–32)
Calcium: 8.8 mg/dL (ref 8.4–10.5)
Chloride: 102 mEq/L (ref 96–112)
Creatinine, Ser: 0.59 mg/dL (ref 0.40–1.20)
GFR: 109.52 mL/min (ref >60.00)
Glucose, Bld: 143 mg/dL — ABNORMAL HIGH (ref 70–99)
Potassium: 3.9 mEq/L (ref 3.5–5.1)
Sodium: 136 mEq/L (ref 135–145)

## 2018-07-02 LAB — HEMOGLOBIN A1C: Hgb A1c MFr Bld: 6.1 % (ref 4.6–6.5)

## 2018-07-02 LAB — MICROALBUMIN / CREATININE URINE RATIO
Creatinine,U: 101.5 mg/dL
Microalb Creat Ratio: 0.8 mg/g (ref 0.0–30.0)
Microalb, Ur: 0.8 mg/dL (ref 0.0–1.9)

## 2018-07-05 ENCOUNTER — Encounter: Payer: Self-pay | Admitting: Endocrinology

## 2018-07-05 ENCOUNTER — Other Ambulatory Visit: Payer: Self-pay

## 2018-07-05 ENCOUNTER — Ambulatory Visit (INDEPENDENT_AMBULATORY_CARE_PROVIDER_SITE_OTHER): Payer: 59 | Admitting: Endocrinology

## 2018-07-05 VITALS — BP 112/72 | HR 89 | Ht 59.0 in | Wt 123.6 lb

## 2018-07-05 DIAGNOSIS — E782 Mixed hyperlipidemia: Secondary | ICD-10-CM

## 2018-07-05 DIAGNOSIS — E1165 Type 2 diabetes mellitus with hyperglycemia: Secondary | ICD-10-CM | POA: Diagnosis not present

## 2018-07-05 MED ORDER — REPAGLINIDE 1 MG PO TABS: 1.0000 mg | ORAL_TABLET | Freq: Two times a day (BID) | ORAL | 1 refills | Status: DC

## 2018-07-05 NOTE — Patient Instructions (Signed)
Take Prandin with hi Carb meals

## 2018-07-05 NOTE — Progress Notes (Signed)
Patient ID: Anna Jennings, female   DOB: 29-Nov-1970, 48 y.o.   MRN: [id number]   Reason for Appointment : Followup for Type 2 Diabetes  History of Present Illness          Diagnosis: Type 2 diabetes mellitus, date of diagnosis: 2007       Past history: She was initially diagnosed with a two-hour postprandial glucose of 218 in 08/2005 and baseline A1c of 5.7 Apparently she was not treated with oral hypoglycemic drugs initially and started on metformin in 2009. She did have diarrhea with the regular metformin but tolerated extended release preparation better with no side effects even on 2000 mg She thinks her blood sugars are fairly well controlled initially but a couple of years later with higher readings she was given Amaryl also. The dose of this was gradually increased Her A1c readings have been ranging from 6.5-7.2 in between 2011 and 2013 but no further followup done after 11/2011 She had been out of her medications prior to her initial visit here in 12/14 and her blood sugars were progressively higher.  She had side effects of  bloating and swelling with pioglitazone and her [name] was stopped Because of poor control and A1c of 8.5 in 3/15 she was started on Levemir insulin  Recent history:   Oral hypoglycemic drugs the patient is taking are: Glyxambi 10/5,  metformin ER 2 g qd  A1c is  6.1  Current management, blood sugar patterns from her Dexcom download and problems identified are:   She is still able to continue using her Dexcom sensor which is covered  Although she had done very well on her last visit in January her blood sugars appear to be higher at times now  She thinks that this is mostly related to her personal circumstances 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 as well as not going out much and tending to eat more at home  She is also not controlling her carbohydrates, previously was on a relatively low carbohydrate diet with minimal postprandial  hyperglycemia  She was told to take small doses of NovoLog as needed for high carb meals but she has not done so  Her Dexcom tracing shows mostly higher readings postprandially after her evening meals or snacks  Only occasionally further this morning her blood sugar is over 200 when she ate banana bread  She also has gone back on her metformin to 4 tablets since her sugars were somewhat higher  She has recently started walking up to 1 hour daily  Weight is stable  No side effects from Glyxambi        Side effects from medications have been: Diarrhea from regular metformin   Glucose monitoring:        Glucometer:  Currently Dexcom  CGM use % of time  92  2-week average/SD  134+/-27, was 126  Time in range    94    %  % Time Above 180  6.3  % Time above 250  0.1  % Time Below 70  0     PRE-MEAL Fasting Lunch Dinner Bedtime Overall  Glucose range:       Averages:  129    134  134   POST-MEAL PC Breakfast PC Lunch PC Dinner  Glucose range:     Averages:  135  131  155     Wt Readings from Last 3 Encounters:  07/05/18 123 lb 9.6 oz (56.1 kg)  03/05/18  123 lb 6.4 oz (56 kg)  12/12/17 129 lb (58.5 kg)      Glycemic control:  Lab Results  Component Value Date   HGBA1C 6.1 07/02/2018   HGBA1C 5.6 03/02/2018   HGBA1C 6.2 09/12/2017   Lab Results  Component Value Date   MICROALBUR 0.8 07/02/2018   LDLCALC 95 03/02/2018   CREATININE 0.59 07/02/2018   Lab Results  Component Value Date   FRUCTOSAMINE 248 09/05/2016   FRUCTOSAMINE 322 (H) 08/17/2015    Self-care: The diet that the patient has been following is: tries to limit portions, usually has some carbohydrate at every meal     Meals: 2 meals per day.  no breakfast, am snack, sandwich at lunch;  Some rice at meals Occasionally which she thinks tends to raise her sugar for couple of days          Exercise: walking  regularly, counting steps and usually trying to go up to 10,000 steps a day  Dietician visit:  Most recent:2011?                 Compliance with the medical regimen: Good   Weight history:  Wt Readings from Last 3 Encounters:  07/05/18 123 lb 9.6 oz (56.1 kg)  03/05/18 123 lb 6.4 oz (56 kg)  12/12/17 129 lb (58.5 kg)    Lab on 07/02/2018  Component Date Value Ref Range Status  . Microalb, Ur 07/02/2018 0.8  0.0 - 1.9 mg/dL Final  . Creatinine,U 07/02/2018 101.5  mg/dL Final  . Microalb Creat Ratio 07/02/2018 0.8  0.0 - 30.0 mg/g Final  . Sodium 07/02/2018 136  135 - 145 mEq/L Final  . Potassium 07/02/2018 3.9  3.5 - 5.1 mEq/L Final  . Chloride 07/02/2018 102  96 - 112 mEq/L Final  . CO2 07/02/2018 25  19 - 32 mEq/L Final  . Glucose, Bld 07/02/2018 143* 70 - 99 mg/dL Final  . BUN 07/02/2018 16  6 - 23 mg/dL Final  . Creatinine, Ser 07/02/2018 0.59  0.40 - 1.20 mg/dL Final  . Calcium 07/02/2018 8.8  8.4 - 10.5 mg/dL Final  . GFR 07/02/2018 109.52  >60.00 mL/min Final  . Hgb A1c MFr Bld 07/02/2018 6.1  4.6 - 6.5 % Final   Glycemic Control Guidelines for People with Diabetes:Non Diabetic:  <6%Goal of Therapy: <7%Additional Action Suggested:  >8%     Allergies as of 07/05/2018      Reactions   Sulfa Antibiotics Nausea Only   Still able to take but makes nausea      Medication List       Accurate as of Jul 05, 2018  2:54 PM. If you have any questions, ask your nurse or doctor.        cetirizine 10 MG tablet Commonly known as:  ZYRTEC Take 20 mg by mouth daily.   diphenhydrAMINE 25 MG tablet Commonly known as:  BENADRYL Take 25 mg by mouth at bedtime as needed.   Glyxambi 10-5 MG Tabs Generic drug:  Empagliflozin-linaGLIPtin TAKE 1 TABLET BY MOUTH EVERY DAY   Insulin Pen Needle 32G X 4 MM Misc Commonly known as:  BD Pen Needle [place] U/F USE ONE PEN NEEDLE 4 TIMES PER DAY TO INJECT INSULIN   levocetirizine 5 MG tablet Commonly known as:  XYZAL Take 5 mg by mouth every evening.   meloxicam 15 MG tablet Commonly known as:  MOBIC Take 1 tablet (15 mg total) by  mouth daily. For 2 weeks with food  and then as needed.   metFORMIN 500 MG 24 hr tablet Commonly known as:  GLUCOPHAGE-XR TAKE 2 TABLETS BY MOUTH TWO TIMES DAILY   OneTouch Verio IQ System w/Device Kit Use to test blood sugars 3 times daily.   repaglinide 1 MG tablet Commonly known as:  Prandin Take 1 tablet (1 mg total) by mouth 2 (two) times daily before a meal. Take when eating Carbs Started by:  Elayne Snare, MD       Allergies:  Allergies  Allergen Reactions  . Sulfa Antibiotics Nausea Only    Still able to take but makes nausea    Past Medical History:  Diagnosis Date  . Diabetes mellitus without complication [facility])     Past Surgical History:  Procedure Laterality Date  . APPENDECTOMY    . CHOLECYSTECTOMY    . PANCREAS SURGERY     Tumor head of pancreas, incompletely resected    Family History  Problem Relation Age of Onset  . Hyperlipidemia Father   . Hypertension Father   . Coronary artery disease Father   . Diabetes Brother   . Heart disease Paternal Grandfather     Social History:  reports that she has never smoked. She has never used smokeless tobacco. She reports current alcohol use. She reports that she does not use drugs.    Review of Systems       Lipids: was on Lipitor  previously when she stopped it because of generalized aches and pains which were significant  She  does have a fairly significant family history of CAD She was taking pravastatin 20 mg and this was also stopped  in December 2017 because of aching  Usually trying to watch her fat intake   LDL is as follows:  Lab Results  Component Value Date   CHOL 183 03/02/2018   HDL 61.20 03/02/2018   LDLCALC 95 03/02/2018   LDLDIRECT 134.4 08/19/2013   TRIG 132.0 03/02/2018   CHOLHDL 3 03/02/2018      She has had severe vitamin D deficiency diagnosed in 2009 with a level of 5.5. She is taking her recommended dose of vitamin D3 2000 units with normal levels   Lab Results   Component Value Date   VD25OH 51.61 03/02/2018   VD25OH 41.60 09/05/2016   VD25OH 16.91 (L) 04/18/2016   VD25OH 26.03 (L) 09/28/2015    She had fatigue and her B12 level was low previously at 143 Her supplement was reduced in 1/20  Lab Results  Component Value Date   VITAMINB12 >1500 (H) 03/02/2018    She will take magnesium at times for her constipation   Physical Examination:  BP 112/72 (BP Location: Left Arm, Patient Position: Sitting, Cuff Size: Normal)   Pulse 89   Ht _0  (1.499 m)   Wt 123 lb 9.6 oz (56.1 kg)   SpO2 97%   BMI 24.96 kg/m    Diabetic Foot Exam - Simple   Simple Foot Form Diabetic Foot exam was performed with the following findings:  Yes 07/05/2018  2:03 PM  Visual Inspection No deformities, no ulcerations, no other skin breakdown bilaterally:  Yes Sensation Testing Intact to touch and monofilament testing bilaterally:  Yes Pulse Check Posterior Tibialis and Dorsalis pulse intact bilaterally:  Yes Comments     ASSESSMENT/PLAN:   Diabetes type 2 with BMI 25  See history of present illness for detailed discussion of current management, blood sugar patterns and problems identified   She has an A1c in the  normal range again at 6.1 although has been as low as 5.6  With Glyxambi and metformin her blood sugars are fairly well controlled with average 134 on her Dexcom sensor over the last 2 weeks However she does have periodic spikes in her blood sugars in the evening when eating more carbohydrates Only recently is more consistently walking for exercise She thinks she is now able to focus more on getting back on her low carbohydrate diet as before  However since she is still working and will eat high carbohydrate meals she can try using Prandin 1 mg for those meals, given handout on how this works [employer] but may consider increasing the dose if needed No change in metformin which she is tolerating    LIPIDS: To be checked on the  next visit  Patient Instructions  Take Prandin with hi Carb meals     Follow-up in 4 months  Antolin Belsito 07/05/2018, 2:54 PM

## 2018-10-04 ENCOUNTER — Other Ambulatory Visit (INDEPENDENT_AMBULATORY_CARE_PROVIDER_SITE_OTHER): Payer: 59

## 2018-10-04 ENCOUNTER — Other Ambulatory Visit: Payer: Self-pay

## 2018-10-04 DIAGNOSIS — E782 Mixed hyperlipidemia: Secondary | ICD-10-CM | POA: Diagnosis not present

## 2018-10-04 DIAGNOSIS — E1165 Type 2 diabetes mellitus with hyperglycemia: Secondary | ICD-10-CM | POA: Diagnosis not present

## 2018-10-04 LAB — HEMOGLOBIN A1C: Hgb A1c MFr Bld: 6.3 % (ref 4.6–6.5)

## 2018-10-04 LAB — LIPID PANEL
Cholesterol: 171 mg/dL (ref 0–200)
HDL: 52.3 mg/dL (ref >39.00)
LDL Cholesterol: 94 mg/dL (ref 0–99)
NonHDL: 118.57
Total CHOL/HDL Ratio: 3
Triglycerides: 121 mg/dL (ref 0.0–149.0)
VLDL: 24.2 mg/dL (ref 0.0–40.0)

## 2018-10-04 LAB — COMPREHENSIVE METABOLIC PANEL
ALT: 14 U/L (ref 0–35)
AST: 17 U/L (ref 0–37)
Albumin: 4.3 g/dL (ref 3.5–5.2)
Alkaline Phosphatase: 68 U/L (ref 39–117)
BUN: 11 mg/dL (ref 6–23)
CO2: 31 mEq/L (ref 19–32)
Calcium: 9.5 mg/dL (ref 8.4–10.5)
Chloride: 104 mEq/L (ref 96–112)
Creatinine, Ser: 0.57 mg/dL (ref 0.40–1.20)
GFR: 113.84 mL/min (ref >60.00)
Glucose, Bld: 136 mg/dL — ABNORMAL HIGH (ref 70–99)
Potassium: 4.3 mEq/L (ref 3.5–5.1)
Sodium: 140 mEq/L (ref 135–145)
Total Bilirubin: 0.3 mg/dL (ref 0.2–1.2)
Total Protein: 7.3 g/dL (ref 6.0–8.3)

## 2018-10-08 ENCOUNTER — Encounter: Payer: Self-pay | Admitting: Endocrinology

## 2018-10-08 ENCOUNTER — Other Ambulatory Visit: Payer: Self-pay

## 2018-10-08 ENCOUNTER — Ambulatory Visit: Payer: 59 | Admitting: Endocrinology

## 2018-10-08 ENCOUNTER — Other Ambulatory Visit: Payer: Self-pay | Admitting: Endocrinology

## 2018-10-08 VITALS — BP 104/64 | HR 103 | Ht 59.0 in | Wt 122.4 lb

## 2018-10-08 DIAGNOSIS — E119 Type 2 diabetes mellitus without complications: Secondary | ICD-10-CM

## 2018-10-08 DIAGNOSIS — E559 Vitamin D deficiency, unspecified: Secondary | ICD-10-CM

## 2018-10-08 DIAGNOSIS — E538 Deficiency of other specified B group vitamins: Secondary | ICD-10-CM | POA: Diagnosis not present

## 2018-10-08 MED ORDER — REPAGLINIDE 1 MG PO TABS: 1.0000 mg | ORAL_TABLET | Freq: Two times a day (BID) | ORAL | 1 refills | Status: DC

## 2018-10-08 NOTE — Progress Notes (Signed)
Patient ID: Anna Jennings, female   DOB: [birth year], 48 y.o.   MRN: [id number]   Reason for Appointment : Followup for Type 2 Diabetes  History of Present Illness          Diagnosis: Type 2 diabetes mellitus, date of diagnosis: 2007       Past history: She was initially diagnosed with a two-hour postprandial glucose of 218 in 08/2005 and baseline A1c of 5.7 Apparently she was not treated with oral hypoglycemic drugs initially and started on metformin in 2009. She did have diarrhea with the regular metformin but tolerated extended release preparation better with no side effects even on 2000 mg She thinks her blood sugars are fairly well controlled initially but a couple of years later with higher readings she was given Amaryl also. The dose of this was gradually increased Her A1c readings have been ranging from 6.5-7.2 in between 2011 and 2013 but no further followup done after 11/2011 She had been out of her medications prior to her initial visit here in 12/14 and her blood sugars were progressively higher.  She had side effects of  bloating and swelling with pioglitazone and her [name] was stopped Because of poor control and A1c of 8.5 in 3/15 she was started on Levemir insulin  Recent history:   Oral hypoglycemic drugs the patient is taking are: Glyxambi 10/5,  metformin ER 2 g qd  A1c is  6.3, stable  Current management, blood sugar patterns from her Dexcom download and problems identified are:   She was told to start Prandin on her last visit for high carbohydrate meals but she did not pick up the prescription  Her blood sugars will go up over 200 late morning after breakfast or after about 7 PM when she is eating more carbohydrates although this is not consistent  Generally her blood sugars trending higher between 4 PM and 10 PM  She is generally trying to be more active and usually exercising with water exercises in the mornings almost daily now  Weight is down 1  pound  Lab glucose was 136 fasting, similar to her home readings  Postprandial readings are mostly related to eating more carbohydrates like bread in the evenings and possibly snacks  In the mornings if she has a snack after exercise and then has breakfast blood sugars may be higher  No side effects from Glyxambi        Side effects from medications have been: Diarrhea from regular metformin   Glucose monitoring:        Glucometer:  Currently Dexcom  CGM use % of time  86  2-week average/SD  138+/- 26, was 134  Time in range     93 %  % Time Above 180  7  % Time above 250   % Time Below 70 0     PRE-MEAL Fasting Lunch Dinner Bedtime Overall  Glucose range:     118-235   Averages:  129  152  148     POST-MEAL PC Breakfast PC Lunch PC Dinner  Glucose range:     Averages:  130  159  158     Wt Readings from Last 3 Encounters:  10/08/18 122 lb 6.4 oz (55.5 kg)  07/05/18 123 lb 9.6 oz (56.1 kg)  03/05/18 123 lb 6.4 oz (56 kg)     Glycemic control:  Lab Results  Component Value Date   HGBA1C 6.3 10/04/2018   HGBA1C 6.1 07/02/2018  HGBA1C 5.6 03/02/2018   Lab Results  Component Value Date   MICROALBUR 0.8 07/02/2018   LDLCALC 94 10/04/2018   CREATININE 0.57 10/04/2018   Lab Results  Component Value Date   FRUCTOSAMINE 248 09/05/2016   FRUCTOSAMINE 322 (H) 08/17/2015    Self-care: The diet that the patient has been following is: tries to limit portions, usually has some carbohydrate at every meal     Meals: 2 meals per day.  no breakfast, am snack, sandwich at lunch;  Some rice at meals Occasionally which she thinks tends to raise her sugar for couple of days          Exercise: walking  regularly, counting steps and usually trying to go up to 10,000 steps a day  Dietician visit: Most recent:2011?                 Compliance with the medical regimen: Good   Weight history:  Wt Readings from Last 3 Encounters:  10/08/18 122 lb 6.4 oz (55.5 kg)  07/05/18  123 lb 9.6 oz (56.1 kg)  03/05/18 123 lb 6.4 oz (56 kg)    Lab on 10/04/2018  Component Date Value Ref Range Status  . Cholesterol 10/04/2018 171  0 - 200 mg/dL Final   ATP III Classification       Desirable:  < 200 mg/dL               Borderline High:  200 - 239 mg/dL          High:  > = 240 mg/dL  . Triglycerides 10/04/2018 121.0  0.0 - 149.0 mg/dL Final   Normal:  <150 mg/dLBorderline High:  150 - 199 mg/dL  . HDL 10/04/2018 52.30  >39.00 mg/dL Final  . VLDL 10/04/2018 24.2  0.0 - 40.0 mg/dL Final  . LDL Cholesterol 10/04/2018 94  0 - 99 mg/dL Final  . Total CHOL/HDL Ratio 10/04/2018 3   Final                  Men          Women1/2 Average Risk     3.4          3.3Average Risk          5.0          4.42X Average Risk          9.6          7.13X Average Risk          15.0          11.0                      . NonHDL 10/04/2018 118.57   Final   NOTE:  Non-HDL goal should be 30 mg/dL higher than patient's LDL goal (i.e. LDL goal of < 70 mg/dL, would have non-HDL goal of < 100 mg/dL)  . Sodium 10/04/2018 140  135 - 145 mEq/L Final  . Potassium 10/04/2018 4.3  3.5 - 5.1 mEq/L Final  . Chloride 10/04/2018 104  96 - 112 mEq/L Final  . CO2 10/04/2018 31  19 - 32 mEq/L Final  . Glucose, Bld 10/04/2018 136* 70 - 99 mg/dL Final  . BUN 10/04/2018 11  6 - 23 mg/dL Final  . Creatinine, Ser 10/04/2018 0.57  0.40 - 1.20 mg/dL Final  . Total Bilirubin 10/04/2018 0.3  0.2 - 1.2 mg/dL Final  . Alkaline Phosphatase 10/04/2018 68  39 -  117 U/L Final  . AST 10/04/2018 17  0 - 37 U/L Final  . ALT 10/04/2018 14  0 - 35 U/L Final  . Total Protein 10/04/2018 7.3  6.0 - 8.3 g/dL Final  . Albumin 10/04/2018 4.3  3.5 - 5.2 g/dL Final  . Calcium 10/04/2018 9.5  8.4 - 10.5 mg/dL Final  . GFR 10/04/2018 113.84  >60.00 mL/min Final  . Hgb A1c MFr Bld 10/04/2018 6.3  4.6 - 6.5 % Final   Glycemic Control Guidelines for People with Diabetes:Non Diabetic:  <6%Goal of Therapy: <7%Additional Action Suggested:  >8%      Allergies as of 10/08/2018      Reactions   Sulfa Antibiotics Nausea Only   Still able to take but makes nausea      Medication List       Accurate as of October 08, 2018  1:27 PM. If you have any questions, ask your nurse or doctor.        STOP taking these medications   Insulin Pen Needle 32G X 4 MM Misc Commonly known as: BD Pen Needle Nano U/F Stopped by: Elayne Snare, MD   meloxicam 15 MG tablet Commonly known as: MOBIC Stopped by: Elayne Snare, MD   OneTouch Verio IQ System w/Device Kit Stopped by: Elayne Snare, MD     TAKE these medications   cetirizine 10 MG tablet Commonly known as: ZYRTEC Take 20 mg by mouth daily.   diphenhydrAMINE 25 MG tablet Commonly known as: BENADRYL Take 25 mg by mouth at bedtime as needed.   Glyxambi 10-5 MG Tabs Generic drug: Empagliflozin-linaGLIPtin TAKE 1 TABLET BY MOUTH EVERY DAY   levocetirizine 5 MG tablet Commonly known as: XYZAL Take 5 mg by mouth every evening.   metFORMIN 500 MG 24 hr tablet Commonly known as: GLUCOPHAGE-XR TAKE 2 TABLETS BY MOUTH TWO TIMES DAILY   repaglinide 1 MG tablet Commonly known as: Prandin Take 1 tablet (1 mg total) by mouth 2 (two) times daily before a meal. Take when eating Carbs       Allergies:  Allergies  Allergen Reactions  . Sulfa Antibiotics Nausea Only    Still able to take but makes nausea    Past Medical History:  Diagnosis Date  . Diabetes mellitus without complication [facility])     Past Surgical History:  Procedure Laterality Date  . APPENDECTOMY    . CHOLECYSTECTOMY    . PANCREAS SURGERY     Tumor head of pancreas, incompletely resected    Family History  Problem Relation Age of Onset  . Hyperlipidemia Father   . Hypertension Father   . Coronary artery disease Father   . Diabetes Brother   . Heart disease Paternal Grandfather     Social History:  reports that she has never smoked. She has never used smokeless tobacco. She reports current alcohol use. She  reports that she does not use drugs.    Review of Systems       Lipids: was on Lipitor  previously when she stopped it because of generalized aches and pains which were significant  She  does have a fairly significant family history of CAD She was taking pravastatin 20 mg and this was also stopped  in December 2017 because of aching  She is usually avoiding high-fat foods   LDL is as follows:  Lab Results  Component Value Date   CHOL 171 10/04/2018   HDL 52.30 10/04/2018   LDLCALC 94 10/04/2018   LDLDIRECT 134.4  08/19/2013   TRIG 121.0 10/04/2018   CHOLHDL 3 10/04/2018      She has had severe vitamin D deficiency diagnosed in 2009 with a level of 5.5. She is taking her recommended dose of vitamin D3 2000 units with normal levels   Lab Results  Component Value Date   VD25OH 51.61 03/02/2018   VD25OH 41.60 09/05/2016   VD25OH 16.91 (L) 04/18/2016   VD25OH 26.03 (L) 09/28/2015    She had fatigue and her B12 level was low previously at 143 Her supplement was reduced in 1/20  Lab Results  Component Value Date   VITAMINB12 >1500 (H) 03/02/2018      Physical Examination:  BP 104/64 (BP Location: Left Arm, Patient Position: Sitting, Cuff Size: Normal)   Pulse (!) 103   Ht 4' 11"  (1.499 m)   Wt 122 lb 6.4 oz (55.5 kg)   SpO2 98%   BMI 24.72 kg/m    ASSESSMENT/PLAN:   Diabetes type 2 with BMI 25  See history of present illness for detailed discussion of current management, blood sugar patterns and problems identified  She has an A1c in the normal range again at 6.3  Her blood sugar patterns were analyzed by Dexcom sensor that she is using along with her smart phone She still has tendency to high postprandial readings May benefit from a higher dose of Jardiance but she has a 90-day supply of 10 mg However since her postprandial readings are mostly related to high carbohydrate intake which she cannot predict she will benefit from Prandin that was discussed  before She says she will go ahead and pick up the prescription and take this as needed before her meals She should do better with insulin and not have to go back on insulin  Continue Glyxambi that she has at home but increase the dose to the 25/5 doses on her next prescription  No change in metformin dosage   HYPERCHOLESTEROLEMIA: Now LDL is more consistently below 100, since she is statin intolerant will not start any May consider a statin after she is postmenopausal  Patient Instructions  Prandin before eating more Carbs  Check blood sugars on waking up days a week  Also check blood sugars about 2 hours after meals and do this after different meals by rotation  Recommended blood sugar levels on waking up are 90-120 and about 2 hours after meal is 130-160  Call before refilling GLYXAMBI      Follow-up in 4 months  Carmen Tolliver 10/08/2018, 1:27 PM

## 2018-10-08 NOTE — Patient Instructions (Signed)
Prandin before eating more Carbs  Check blood sugars on waking up days a week  Also check blood sugars about 2 hours after meals and do this after different meals by rotation  Recommended blood sugar levels on waking up are 90-120 and about 2 hours after meal is 130-160  Call before refilling GLYXAMBI

## 2018-12-13 ENCOUNTER — Telehealth: Payer: Self-pay

## 2018-12-13 ENCOUNTER — Other Ambulatory Visit: Payer: Self-pay

## 2018-12-13 MED ORDER — GLYXAMBI 25-5 MG PO TABS: 1.0000 | ORAL_TABLET | Freq: Every day | ORAL | 2 refills | Status: DC

## 2018-12-13 NOTE — Telephone Encounter (Signed)
Updated Rx sent

## 2018-12-13 NOTE — Telephone Encounter (Signed)
patient called in needing a change in a her prescription GLYXAMBI 10-5 MG TABS. Stated that the dosage was supposed to be more and pharmacy still has same dosage.  Please advise

## 2018-12-13 NOTE — Telephone Encounter (Signed)
The new dose will be 25/5 1 tablet daily

## 2019-01-04 ENCOUNTER — Other Ambulatory Visit: Payer: Self-pay

## 2019-01-04 ENCOUNTER — Other Ambulatory Visit (INDEPENDENT_AMBULATORY_CARE_PROVIDER_SITE_OTHER): Payer: 59

## 2019-01-04 DIAGNOSIS — E559 Vitamin D deficiency, unspecified: Secondary | ICD-10-CM

## 2019-01-04 DIAGNOSIS — E538 Deficiency of other specified B group vitamins: Secondary | ICD-10-CM

## 2019-01-04 DIAGNOSIS — E119 Type 2 diabetes mellitus without complications: Secondary | ICD-10-CM

## 2019-01-04 LAB — VITAMIN D 25 HYDROXY (VIT D DEFICIENCY, FRACTURES): VITD: 48.67 ng/mL (ref 30.00–100.00)

## 2019-01-04 LAB — COMPREHENSIVE METABOLIC PANEL
ALT: 18 U/L (ref 0–35)
AST: 17 U/L (ref 0–37)
Albumin: 4.2 g/dL (ref 3.5–5.2)
Alkaline Phosphatase: 70 U/L (ref 39–117)
BUN: 15 mg/dL (ref 6–23)
CO2: 30 mEq/L (ref 19–32)
Calcium: 8.4 mg/dL (ref 8.4–10.5)
Chloride: 103 mEq/L (ref 96–112)
Creatinine, Ser: 0.59 mg/dL (ref 0.40–1.20)
GFR: 109.28 mL/min (ref >60.00)
Glucose, Bld: 137 mg/dL — ABNORMAL HIGH (ref 70–99)
Potassium: 3.9 mEq/L (ref 3.5–5.1)
Sodium: 138 mEq/L (ref 135–145)
Total Bilirubin: 0.3 mg/dL (ref 0.2–1.2)
Total Protein: 6.9 g/dL (ref 6.0–8.3)

## 2019-01-04 LAB — CBC
HCT: 37.2 % (ref 36.0–46.0)
Hemoglobin: 12.5 g/dL (ref 12.0–15.0)
MCHC: 33.6 g/dL (ref 30.0–36.0)
MCV: 83.4 fl (ref 78.0–100.0)
Platelets: 271 10*3/uL (ref 150.0–400.0)
RBC: 4.46 Mil/uL (ref 3.87–5.11)
RDW: 14.1 % (ref 11.5–15.5)
WBC: 5.6 10*3/uL (ref 4.0–10.5)

## 2019-01-04 LAB — VITAMIN B12: Vitamin B-12: 904 pg/mL (ref 211–911)

## 2019-01-04 LAB — HEMOGLOBIN A1C: Hgb A1c MFr Bld: 6.1 % (ref 4.6–6.5)

## 2019-01-07 ENCOUNTER — Other Ambulatory Visit: Payer: Self-pay

## 2019-01-09 ENCOUNTER — Encounter: Payer: Self-pay | Admitting: Endocrinology

## 2019-01-09 ENCOUNTER — Ambulatory Visit: Payer: 59 | Admitting: Endocrinology

## 2019-01-09 VITALS — BP 108/60 | HR 108 | Ht 59.0 in | Wt 124.2 lb

## 2019-01-09 DIAGNOSIS — Z23 Encounter for immunization: Secondary | ICD-10-CM

## 2019-01-09 DIAGNOSIS — E782 Mixed hyperlipidemia: Secondary | ICD-10-CM

## 2019-01-09 DIAGNOSIS — E119 Type 2 diabetes mellitus without complications: Secondary | ICD-10-CM

## 2019-01-09 NOTE — Progress Notes (Signed)
Patient ID: Anna Jennings, female   DOB: 1970-07-21, 48 y.o.   MRN: [id number]   Reason for Appointment : Followup for Type 2 Diabetes  History of Present Illness          Diagnosis: Type 2 diabetes mellitus, date of diagnosis: 2007       Past history: She was initially diagnosed with a two-hour postprandial glucose of 218 in 08/2005 and baseline A1c of 5.7 Apparently she was not treated with oral hypoglycemic drugs initially and started on metformin in 2009. She did have diarrhea with the regular metformin but tolerated extended release preparation better with no side effects even on 2000 mg She thinks her blood sugars are fairly well controlled initially but a couple of years later with higher readings she was given Amaryl also. The dose of this was gradually increased Her A1c readings have been ranging from 6.5-7.2 in between 2011 and 2013 but no further followup done after 11/2011 She had been out of her medications prior to her initial visit here in 12/14 and her blood sugars were progressively higher.  She had side effects of  bloating and swelling with pioglitazone and her [name] was stopped Because of poor control and A1c of 8.5 in 3/15 she was started on Levemir insulin  Recent history:   Oral hypoglycemic drugs the patient is taking are: Glyxambi 25/5,  metformin ER 2 g qd  A1c is 6.1, previously 6.3,   Current management, blood sugar patterns from her Dexcom download and problems identified are:   She was told to start Prandin 1 mg on her last visit for any large carbohydrate meals but she says that if she took it in the morning she would get low sugars by lunchtime  She still has periodic high readings after meals especially afternoon and evening but depending on her diet  She is periodically stress eating and causing her sugars to be higher  She also thinks that her blood sugars go up about 20 mg when she is exercising although when she first started blood  sugars will go up even higher  JARDIANCE component of her Glyxambi was increased to 25 mg after her last visit about 4 weeks ago  In the last 2 weeks her blood sugars are averaging about the same as on her last visit  She is still exercising regularly with either walking, water exercises or other aerobic activities  Her weight is up only about 2 pounds        Side effects from medications have been: Diarrhea from regular metformin   Glucose monitoring:    With Dexcom   CONTINUOUS GLUCOSE MONITORING RECORD INTERPRETATION    Dates of Recording: 10/29 through 11/11  Sensor description: Dexcom G6  Results statistics:   CGM use % of time  71  Average and SD 140       Time in range  89%  % Time Above 180  10  % Time above 250   % Time Below target 0    Glycemic patterns summary: Blood sugars are mostly stable with most recent rise in blood sugar between about 11 AM and 4 PM but not on every day and generally rising modestly after dinner  Hyperglycemic episodes have occurred sporadically at various times particularly midday and afternoon and occasionally evening after dinner and at least once late at night  Hypoglycemic episodes not present  Overnight periods: Blood sugars are fairly stable most of the nights with a couple of  episodes of hypoglycemia related to late evening rise in blood sugar  Preprandial periods: Glucose before breakfast is generally stable averaging about 120-130, variably high at lunchtime and mostly near 120-130 at dinnertime  Postprandial periods:   After breakfast: Blood sugars are generally not rising in the mornings except 1 or 2 occasions late morning    After lunch:   Glucose appears to have gone up significantly only at least 3 or 4 occasions with peak blood sugar around 250  After dinner: No consistent pattern with at least 3 or 4 episodes where blood sugars are over 180 peak   Previous data:  CGM use % of time  86  2-week average/SD  138+/-  26, was 134  Time in range     93 %  % Time Above 180  7  % Time above 250   % Time Below 70 0     PRE-MEAL Fasting Lunch Dinner Bedtime Overall  Glucose range:     118-235   Averages:  129  152  148     POST-MEAL PC Breakfast PC Lunch PC Dinner  Glucose range:     Averages:  130  159  158     Wt Readings from Last 3 Encounters:  01/09/19 124 lb 3.2 oz (56.3 kg)  10/08/18 122 lb 6.4 oz (55.5 kg)  07/05/18 123 lb 9.6 oz (56.1 kg)     Glycemic control:  Lab Results  Component Value Date   HGBA1C 6.1 01/04/2019   HGBA1C 6.3 10/04/2018   HGBA1C 6.1 07/02/2018   Lab Results  Component Value Date   MICROALBUR 0.8 07/02/2018   LDLCALC 94 10/04/2018   CREATININE 0.59 01/04/2019   Lab Results  Component Value Date   FRUCTOSAMINE 248 09/05/2016   FRUCTOSAMINE 322 (H) 08/17/2015    Self-care: The diet that the patient has been following is: tries to limit portions, usually has some carbohydrate at every meal     Meals: 2 meals per day.  no breakfast, am snack, sandwich at lunch;  Some rice at meals Occasionally which she thinks tends to raise her sugar for couple of days          Exercise: walking  regularly, counting steps and usually trying to go up to 10,000 steps a day  Dietician visit: Most recent:2011?                 Compliance with the medical regimen: Good   Weight history:  Wt Readings from Last 3 Encounters:  01/09/19 124 lb 3.2 oz (56.3 kg)  10/08/18 122 lb 6.4 oz (55.5 kg)  07/05/18 123 lb 9.6 oz (56.1 kg)    Lab on 01/04/2019  Component Date Value Ref Range Status  . WBC 01/04/2019 5.6  4.0 - 10.5 K/uL Final  . RBC 01/04/2019 4.46  3.87 - 5.11 Mil/uL Final  . Platelets 01/04/2019 271.0  150.0 - 400.0 K/uL Final  . Hemoglobin 01/04/2019 12.5  12.0 - 15.0 g/dL Final  . HCT 01/04/2019 37.2  36.0 - 46.0 % Final  . MCV 01/04/2019 83.4  78.0 - 100.0 fl Final  . MCHC 01/04/2019 33.6  30.0 - 36.0 g/dL Final  . RDW 01/04/2019 14.1  11.5 - 15.5 % Final   . VITD 01/04/2019 48.67  30.00 - 100.00 ng/mL Final  . Vitamin B-12 01/04/2019 904  211 - 911 pg/mL Final  . Sodium 01/04/2019 138  135 - 145 mEq/L Final  . Potassium 01/04/2019 3.9  3.5 -  5.1 mEq/L Final  . Chloride 01/04/2019 103  96 - 112 mEq/L Final  . CO2 01/04/2019 30  19 - 32 mEq/L Final  . Glucose, Bld 01/04/2019 137* 70 - 99 mg/dL Final  . BUN 01/04/2019 15  6 - 23 mg/dL Final  . Creatinine, Ser 01/04/2019 0.59  0.40 - 1.20 mg/dL Final  . Total Bilirubin 01/04/2019 0.3  0.2 - 1.2 mg/dL Final  . Alkaline Phosphatase 01/04/2019 70  39 - 117 U/L Final  . AST 01/04/2019 17  0 - 37 U/L Final  . ALT 01/04/2019 18  0 - 35 U/L Final  . Total Protein 01/04/2019 6.9  6.0 - 8.3 g/dL Final  . Albumin 01/04/2019 4.2  3.5 - 5.2 g/dL Final  . GFR 01/04/2019 109.28  >60.00 mL/min Final  . Calcium 01/04/2019 8.4  8.4 - 10.5 mg/dL Final  . Hgb A1c MFr Bld 01/04/2019 6.1  4.6 - 6.5 % Final   Glycemic Control Guidelines for People with Diabetes:Non Diabetic:  <6%Goal of Therapy: <7%Additional Action Suggested:  >8%     Allergies as of 01/09/2019      Reactions   Sulfa Antibiotics Nausea Only   Still able to take but makes nausea      Medication List       Accurate as of January 09, 2019  9:31 AM. If you have any questions, ask your nurse or doctor.        STOP taking these medications   repaglinide 1 MG tablet Commonly known as: Prandin Stopped by: Elayne Snare, MD     TAKE these medications   cetirizine 10 MG tablet Commonly known as: ZYRTEC Take 20 mg by mouth daily.   diphenhydrAMINE 25 MG tablet Commonly known as: BENADRYL Take 25 mg by mouth at bedtime as needed.   Glyxambi 25-5 MG Tabs Generic drug: Empagliflozin-linaGLIPtin Take 1 tablet by mouth daily. Take 1 tablet by mouth once daily.   levocetirizine 5 MG tablet Commonly known as: XYZAL Take 5 mg by mouth every evening.   metFORMIN 500 MG 24 hr tablet Commonly known as: GLUCOPHAGE-XR TAKE 2 TABLETS BY MOUTH  TWO TIMES DAILY       Allergies:  Allergies  Allergen Reactions  . Sulfa Antibiotics Nausea Only    Still able to take but makes nausea    Past Medical History:  Diagnosis Date  . Diabetes mellitus without complication [facility])     Past Surgical History:  Procedure Laterality Date  . APPENDECTOMY    . CHOLECYSTECTOMY    . PANCREAS SURGERY     Tumor head of pancreas, incompletely resected    Family History  Problem Relation Age of Onset  . Hyperlipidemia Father   . Hypertension Father   . Coronary artery disease Father   . Diabetes Brother   . Heart disease Paternal Grandfather     Social History:  reports that she has never smoked. She has never used smokeless tobacco. She reports current alcohol use. She reports that she does not use drugs.    Review of Systems       Lipids: was on Lipitor  previously when she stopped it because of generalized aches and pains which were significant  She  does have a fairly significant family history of CAD She was taking pravastatin 20 mg and this was also stopped  in December 2017 because of muscle aching  She is usually avoiding high-fat foods   LDL is as follows:  Lab Results  Component  Value Date   CHOL 171 10/04/2018   HDL 52.30 10/04/2018   LDLCALC 94 10/04/2018   LDLDIRECT 134.4 08/19/2013   TRIG 121.0 10/04/2018   CHOLHDL 3 10/04/2018      She has had severe vitamin D deficiency diagnosed in 2009 with a level of 5.5. She is taking her recommended dose of vitamin D3 2000 units with normal levels   Lab Results  Component Value Date   VD25OH 48.67 01/04/2019   VD25OH 51.61 03/02/2018   VD25OH 41.60 09/05/2016   VD25OH 16.91 (L) 04/18/2016    She had fatigue and her B12 level was low previously at 143 Currently has therapeutic levels with supplements  Lab Results  Component Value Date   VITAMINB12 904 01/04/2019      Physical Examination:  BP 108/60 (BP Location: Left Arm, Patient Position: Sitting,  Cuff Size: Normal)   Pulse (!) 108   Ht 4\' 11"  (1.499 m)   Wt 124 lb 3.2 oz (56.3 kg)   SpO2 98%   BMI 25.09 kg/m    ASSESSMENT/PLAN:   Diabetes type 2 with BMI 25  See history of present illness for detailed discussion of current management, blood sugar patterns and problems identified  She has an A1c in the normal range again at 6.1  Her blood sugar patterns were analyzed by Dexcom sensor and interpretation is described above  She has inconsistent rise in blood sugar after meals based on her diet High carbohydrate meals and large portions will raise her blood sugar periodically over 200  This is despite increasing her Jardiance dose She is exercising regularly which is likely helping However she may still benefit from use of Prandin For now she can try a half of the 1 mg tablet since the 1 mg tablet tends to cause late hypoglycemia  No change in metformin or Glyxambi dosage   Vitamin B12 and D deficiencies: Adequately replaced  Influenza vaccine given  There are no Patient Instructions on file for this visit.   Follow-up in 4 months  Elayne Snare 01/09/2019, 9:31 AM

## 2019-02-06 ENCOUNTER — Telehealth: Payer: Self-pay | Admitting: Family Medicine

## 2019-02-06 ENCOUNTER — Other Ambulatory Visit: Payer: Self-pay

## 2019-02-06 MED ORDER — ONETOUCH DELICA LANCETS 30G MISC: 1.0000 | Freq: Three times a day (TID) | 2 refills | Status: AC

## 2019-02-06 MED ORDER — GLUCOSE BLOOD VI STRP: ORAL_STRIP | 2 refills | Status: DC

## 2019-02-06 MED ORDER — ONETOUCH VERIO FLEX SYSTEM W/DEVICE KIT: PACK | 0 refills | Status: DC

## 2019-02-06 NOTE — Telephone Encounter (Signed)
Noted  

## 2019-02-06 NOTE — Telephone Encounter (Signed)
Her test strips may be outdated.  Since her A1c has been normal very unlikely that her sugars are over 300

## 2019-02-06 NOTE — Telephone Encounter (Signed)
Called pt and she stated that her Dexcom was reading normal blood sugar levels, but when she checks her blood sugar via finger stick, she continues to get high readings around 3-400's. Pt stated that this has been happening for several days. A new meter and supplies was sent to pt's pharmacy. Is there anything else that you would like to do?

## 2019-02-06 NOTE — Telephone Encounter (Signed)
Pt called and is needing a call back about her meter and test strips. [phone]

## 2019-03-19 ENCOUNTER — Other Ambulatory Visit: Payer: Self-pay | Admitting: Endocrinology

## 2019-04-22 ENCOUNTER — Other Ambulatory Visit: Payer: Self-pay | Admitting: Endocrinology

## 2019-04-22 ENCOUNTER — Telehealth: Payer: Self-pay

## 2019-04-22 NOTE — Telephone Encounter (Signed)
She will switch to Jardiance 25 mg and Tradjenta 5 mg separately, each once a day.

## 2019-04-22 NOTE — Telephone Encounter (Signed)
Patient requests a RX with Refills (patient states she has to go through getting refills every month and asks that be fixed to include refills for the RX requested below:  MEDICATION: GLYXAMBI 25-5 MG TABS  PHARMACY:   [facility] # [place], [place] Phone:  [phone]  Fax:  [phone]      IS THIS A 90 DAY SUPPLY : Yes  IS PATIENT OUT OF MEDICATION: Yes  IF NOT; HOW MUCH IS LEFT: 0  LAST APPOINTMENT DATE: @1 /19/2021  NEXT APPOINTMENT DATE:@3 /09/2019  DO WE HAVE YOUR PERMISSION TO LEAVE A DETAILED MESSAGE: Yes  OTHER COMMENTS:    **Let patient know to contact pharmacy at the end of the day to make sure medication is ready. **  ** Please notify patient to allow 48-72 hours to process**  **Encourage patient to contact the pharmacy for refills or they can request refills through [facility]**

## 2019-04-22 NOTE — Telephone Encounter (Signed)
Received fax from pt's pharmacy stating that [facility] requires a PA.  Alternatives that do not require a PA are Tradjenta.   Alternatives that are preferred but still require PA are: Jardiance 25mg , Steglujan 15-100mg , and Qtern 10-5mg .  Please advise on how you would like to proceed.

## 2019-04-23 ENCOUNTER — Telehealth: Payer: Self-pay | Admitting: Endocrinology

## 2019-04-23 ENCOUNTER — Telehealth: Payer: Self-pay

## 2019-04-23 ENCOUNTER — Other Ambulatory Visit: Payer: Self-pay

## 2019-04-23 MED ORDER — GLYXAMBI 25-5 MG PO TABS: 1.0000 | ORAL_TABLET | Freq: Every day | ORAL | 2 refills | Status: DC

## 2019-04-23 NOTE — Telephone Encounter (Signed)
PA initiated via [email or web address] for Glyxambi 25-5mg  tablets.  Anna Jennings (Key: [id number]) Rx #[id number] Glyxambi 25-5MG  tablets   Form [employer] Electronic Prior Authorization Form (2017 NCPDP) Created 20 hours ago Sent to Plan 1 minute ago Plan Response 1 minute ago Submit Clinical Questions less than a minute ago Determination Wait for Determination Please wait for [employer] 2017 NCPDP to return a determination.

## 2019-04-23 NOTE — Telephone Encounter (Signed)
Rx sent for 30 day supply and PA submitted.

## 2019-04-23 NOTE — Telephone Encounter (Signed)
You can do a PA which is likely going to be denied, in that case she will have to switch

## 2019-04-23 NOTE — Telephone Encounter (Signed)
Patient requests to be called at ph# [phone] re: Glyxambi. Patient states the medication listed above is approved by insurance but should be written for 30 day supply not 90 day supply.

## 2019-04-23 NOTE — Telephone Encounter (Signed)
Pt is denying to change medications. Would you like to attempt PA?

## 2019-04-23 NOTE — Telephone Encounter (Signed)
PA has been completed. Pt also contacted insurance and reportedly was told that this request was denied because it was more than a 90 day supply. However, the denial form did not state this.

## 2019-05-06 ENCOUNTER — Other Ambulatory Visit (INDEPENDENT_AMBULATORY_CARE_PROVIDER_SITE_OTHER): Payer: 59

## 2019-05-06 ENCOUNTER — Other Ambulatory Visit: Payer: Self-pay

## 2019-05-06 DIAGNOSIS — E782 Mixed hyperlipidemia: Secondary | ICD-10-CM

## 2019-05-06 DIAGNOSIS — E119 Type 2 diabetes mellitus without complications: Secondary | ICD-10-CM | POA: Diagnosis not present

## 2019-05-06 LAB — BASIC METABOLIC PANEL
BUN: 25 mg/dL — ABNORMAL HIGH (ref 6–23)
CO2: 30 mEq/L (ref 19–32)
Calcium: 9.5 mg/dL (ref 8.4–10.5)
Chloride: 104 mEq/L (ref 96–112)
Creatinine, Ser: 0.66 mg/dL (ref 0.40–1.20)
GFR: 95.88 mL/min (ref >60.00)
Glucose, Bld: 118 mg/dL — ABNORMAL HIGH (ref 70–99)
Potassium: 4.4 mEq/L (ref 3.5–5.1)
Sodium: 138 mEq/L (ref 135–145)

## 2019-05-06 LAB — LIPID PANEL
Cholesterol: 178 mg/dL (ref 0–200)
HDL: 54.7 mg/dL (ref >39.00)
LDL Cholesterol: 104 mg/dL — ABNORMAL HIGH (ref 0–99)
NonHDL: 123.72
Total CHOL/HDL Ratio: 3
Triglycerides: 99 mg/dL (ref 0.0–149.0)
VLDL: 19.8 mg/dL (ref 0.0–40.0)

## 2019-05-06 LAB — HEMOGLOBIN A1C: Hgb A1c MFr Bld: 6.4 % (ref 4.6–6.5)

## 2019-05-09 ENCOUNTER — Encounter: Payer: 59 | Admitting: Endocrinology

## 2019-05-09 ENCOUNTER — Other Ambulatory Visit: Payer: Self-pay

## 2019-05-09 ENCOUNTER — Encounter: Payer: Self-pay | Admitting: Endocrinology

## 2019-05-13 NOTE — Progress Notes (Signed)
This encounter was created in error - please disregard.

## 2019-05-31 IMAGING — DX DG SHOULDER 2+V*R*
3 series · 3 of 3 positions shown · non-contrast
Comparison: None.

CLINICAL DATA: Shoulder pain for 3 weeks, no known injury, initial
encounter

EXAM:
RIGHT SHOULDER - 2+ VIEW

[shoulder y view]
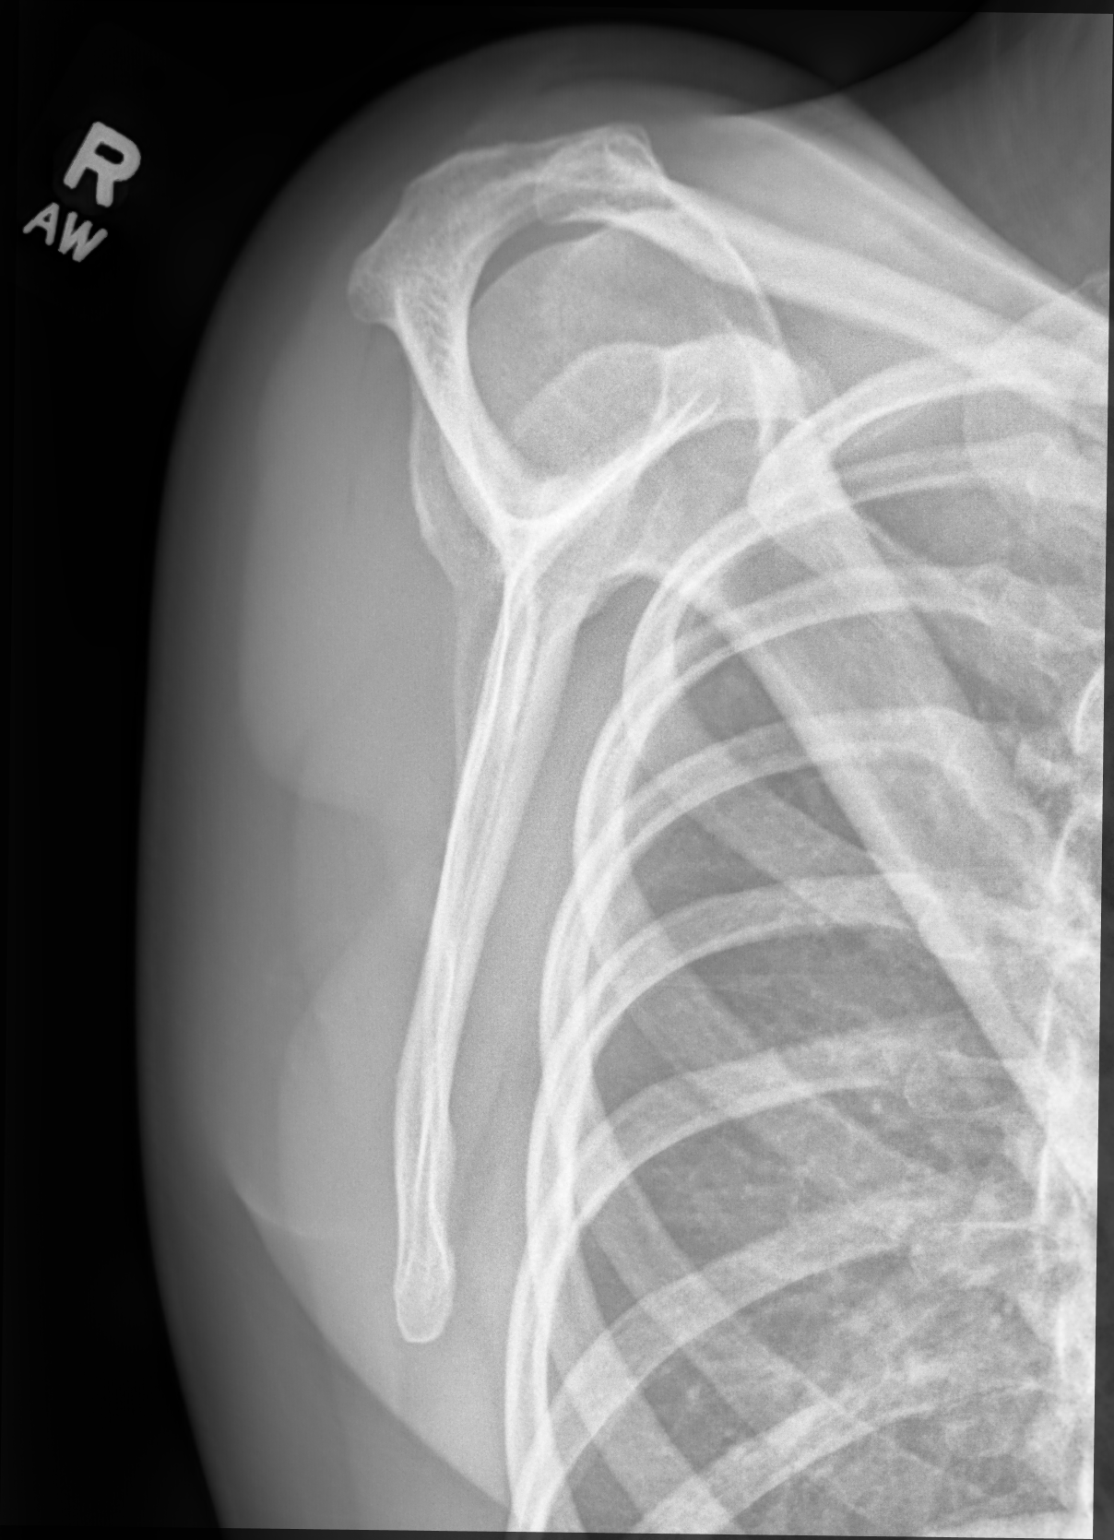

[shoulder (grashey) ap]
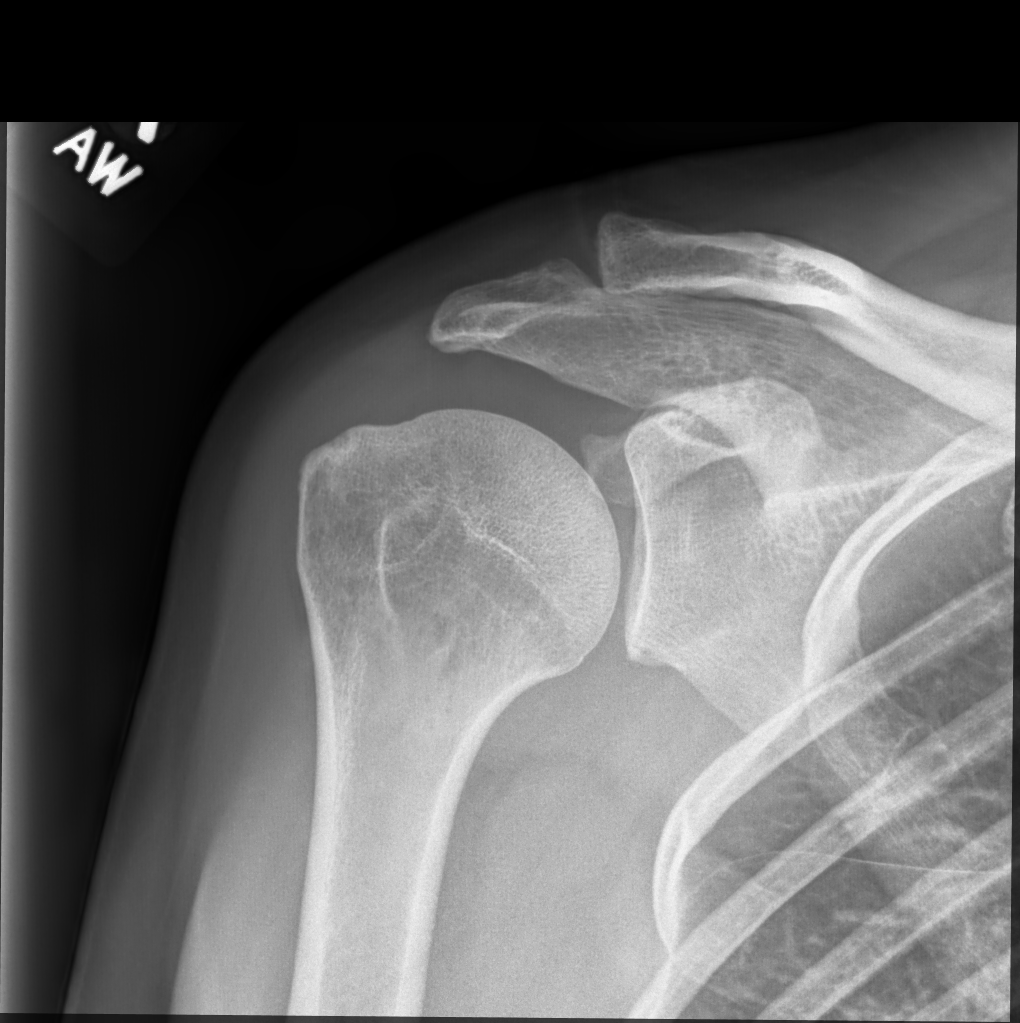

[shoulder axial]
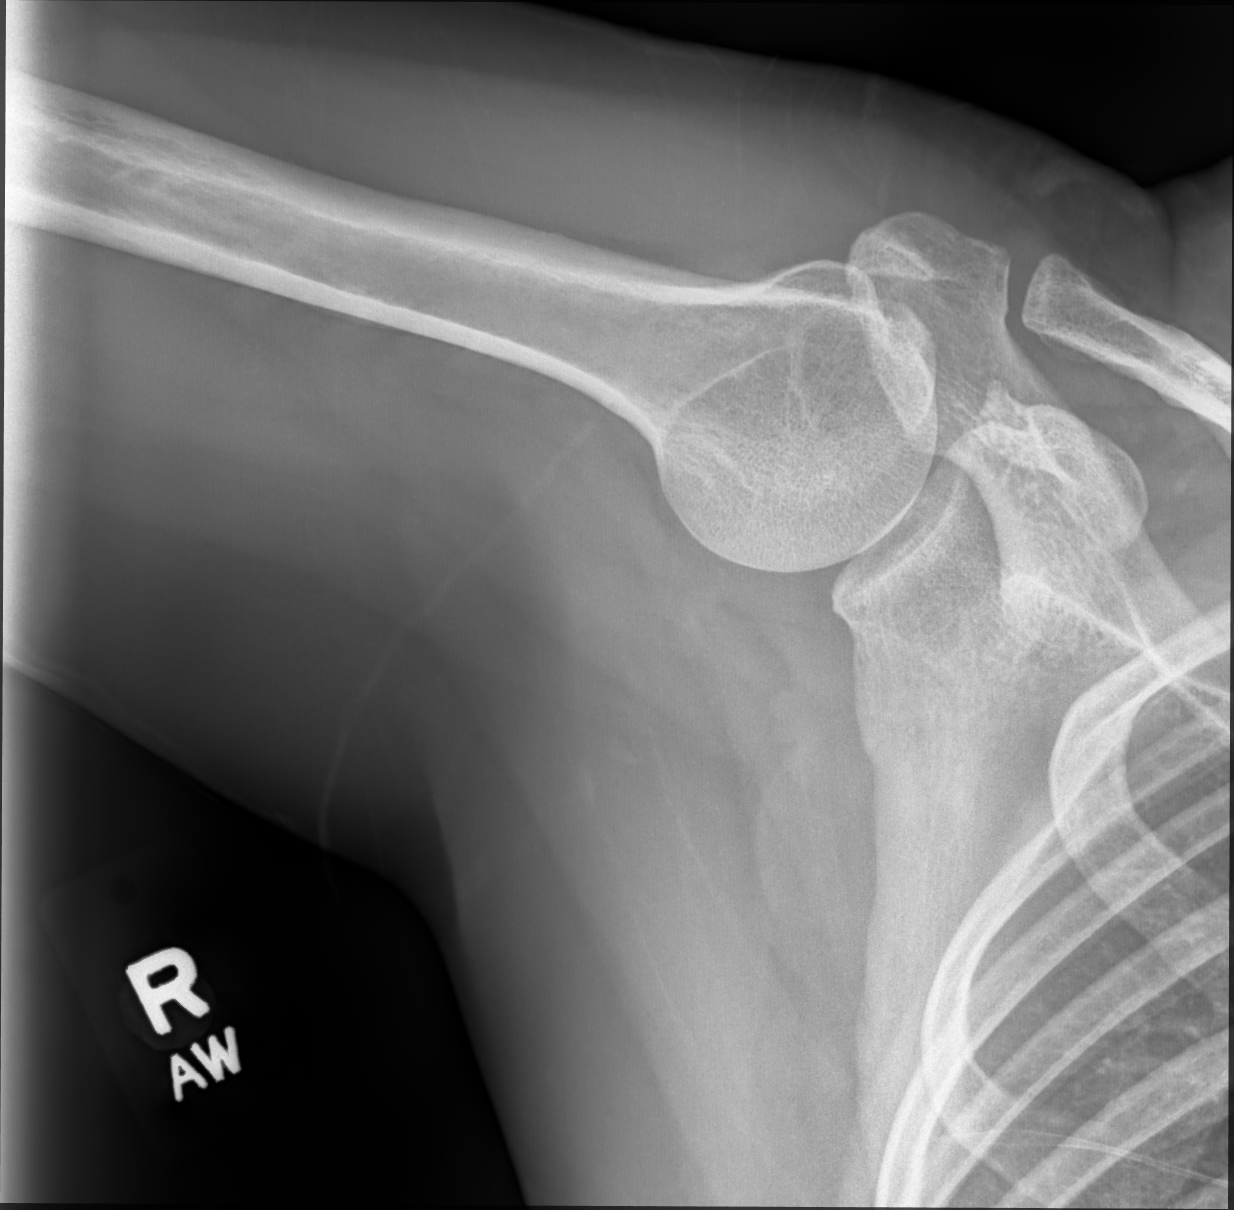

[3 of 3 positions shown; findings below may reference images not displayed]

FINDINGS: No acute fracture or dislocation is noted. The underlying bony
thorax is within normal limits. Mild degenerative changes of the
acromioclavicular joint are seen. No soft tissue abnormality is
noted.
IMPRESSION: No acute abnormality seen.

## 2019-08-28 ENCOUNTER — Other Ambulatory Visit: Payer: Self-pay | Admitting: Endocrinology

## 2019-10-22 ENCOUNTER — Other Ambulatory Visit: Payer: Self-pay

## 2019-10-22 ENCOUNTER — Telehealth: Payer: Self-pay | Admitting: Family Medicine

## 2019-10-22 NOTE — Telephone Encounter (Signed)
Patient called and stated that she was experiencing neck and ear pain and wanted to see if she can take something before appointment tomorrow to help with symptoms, please advise. CB is [phone]

## 2019-10-22 NOTE — Telephone Encounter (Signed)
Patient verbally understood she could try OTC ibuprofen/Tylenol and alternate heat and cold to the neck for pain. She has an appointment tomorrow

## 2019-10-23 ENCOUNTER — Encounter: Payer: Self-pay | Admitting: Family Medicine

## 2019-10-23 ENCOUNTER — Ambulatory Visit: Payer: 59 | Admitting: Family Medicine

## 2019-10-23 VITALS — BP 115/66 | HR 85 | Temp 97.8°F | Ht 59.0 in | Wt 127.4 lb

## 2019-10-23 DIAGNOSIS — S161XXA Strain of muscle, fascia and tendon at neck level, initial encounter: Secondary | ICD-10-CM | POA: Diagnosis not present

## 2019-10-23 MED ORDER — METHOCARBAMOL 500 MG PO TABS: 500.0000 mg | ORAL_TABLET | Freq: Three times a day (TID) | ORAL | 0 refills | Status: DC | PRN

## 2019-10-23 NOTE — Progress Notes (Signed)
Established Patient Office Visit  Subjective:  Patient ID: Anna Jennings, female    DOB: 1970-08-24  Age: 49 y.o. MRN: [id number]  CC:  Chief Complaint  Patient presents with  . Neck Pain    C/O neck pains x 4 days little pains in right ear when she swallows     HPI Mahreen Erazo presents for 3 to 4-day history of pain in her right posterior neck.  There is been no injury.  It did seem to start after she drove home from [place].  But she says that the drive was not stressful.  She has had some tenderness in the right ear.  There was some pain in the neck with swallowing.  She is seeing a painful rash.  Past Medical History:  Diagnosis Date  . Diabetes mellitus without complication [facility])     Past Surgical History:  Procedure Laterality Date  . APPENDECTOMY    . CHOLECYSTECTOMY    . PANCREAS SURGERY     Tumor head of pancreas, incompletely resected    Family History  Problem Relation Age of Onset  . Hyperlipidemia Father   . Hypertension Father   . Coronary artery disease Father   . Diabetes Brother   . Heart disease Paternal Grandfather     Social History   Socioeconomic History  . Marital status: Married    Spouse name: Not on file  . Number of children: Not on file  . Years of education: Not on file  . Highest education level: Not on file  Occupational History  . Not on file  Tobacco Use  . Smoking status: Never Smoker  . Smokeless tobacco: Never Used  Substance and Sexual Activity  . Alcohol use: Yes    Comment: Socially  . Drug use: No  . Sexual activity: Yes    Birth control/protection: None  Other Topics Concern  . Not on file  Social History Narrative  . Not on file   Social Determinants of Health   Financial Resource Strain:   . Difficulty of Paying Living Expenses: Not on file  Food Insecurity:   . Worried About Charity fundraiser in the Last Year: Not on file  . Ran Out of Food in the Last Year: Not on file  Transportation  Needs:   . Lack of Transportation (Medical): Not on file  . Lack of Transportation (Non-Medical): Not on file  Physical Activity:   . Days of Exercise per Week: Not on file  . Minutes of Exercise per Session: Not on file  Stress:   . Feeling of Stress : Not on file  Social Connections:   . Frequency of Communication with Friends and Family: Not on file  . Frequency of Social Gatherings with Friends and Family: Not on file  . Attends Religious Services: Not on file  . Active Member of Clubs or Organizations: Not on file  . Attends Archivist Meetings: Not on file  . Marital Status: Not on file  Intimate Partner Violence:   . Fear of Current or Ex-Partner: Not on file  . Emotionally Abused: Not on file  . Physically Abused: Not on file  . Sexually Abused: Not on file    Outpatient Medications Prior to Visit  Medication Sig Dispense Refill  . Blood Glucose Monitoring Suppl ([place]) w/Device KIT Use as instructed to calibrate Dexcom system up to three times daily. 1 kit 0  . cetirizine (ZYRTEC) 10 MG tablet Take 20  mg by mouth daily.     . Continuous Blood Gluc Sensor (DEXCOM G6 SENSOR) MISC 1 each by Does not apply route See admin instructions. Use one sensor once every 10 days to monitor blood sugar.    . Continuous Blood Gluc Transmit (DEXCOM G6 TRANSMITTER) MISC 1 each by Does not apply route See admin instructions. Use one transmitter once every 90 days to transmit blood sugar.    . diphenhydrAMINE (BENADRYL) 25 MG tablet Take 25 mg by mouth at bedtime as needed.    . Empagliflozin-linaGLIPtin (GLYXAMBI) 25-5 MG TABS Take 1 tablet by mouth daily. 30 tablet 2  . glucose blood test strip Use Onetouch verio test strips as instructed to check blood sugar three times daily. 100 each 2  . levocetirizine (XYZAL) 5 MG tablet Take 5 mg by mouth every evening.    . metFORMIN (GLUCOPHAGE-XR) 500 MG 24 hr tablet TAKE 2 TABLETS BY MOUTH  TWICE DAILY 360 tablet 1  .  OneTouch Delica Lancets 62B MISC 1 each by Does not apply route 3 (three) times daily. Use onetouch delica lancets to check blood sugar three times daily. 100 each 2   No facility-administered medications prior to visit.    Allergies  Allergen Reactions  . Sulfa Antibiotics Nausea Only    Still able to take but makes nausea    ROS Review of Systems  Constitutional: Negative.   HENT: Negative.   Respiratory: Negative.   Cardiovascular: Negative.   Gastrointestinal: Negative.   Musculoskeletal: Positive for neck pain and neck stiffness.  Neurological: Negative for weakness and numbness.  Hematological: Does not bruise/bleed easily.      Objective:    Physical Exam Vitals and nursing note reviewed.  Constitutional:      Appearance: Normal appearance. She is normal weight.  HENT:     Head: Normocephalic and atraumatic.     Right Ear: Tympanic membrane, ear canal and external ear normal. There is no impacted cerumen.     Left Ear: Tympanic membrane, ear canal and external ear normal.     Mouth/Throat:     Mouth: Mucous membranes are moist.     Pharynx: Oropharynx is clear. No oropharyngeal exudate or posterior oropharyngeal erythema.  Eyes:     General: No scleral icterus.       Right eye: No discharge.        Left eye: No discharge.     Conjunctiva/sclera: Conjunctivae normal.     Pupils: Pupils are equal, round, and reactive to light.  Cardiovascular:     Rate and Rhythm: Normal rate and regular rhythm.  Pulmonary:     Effort: Pulmonary effort is normal.     Breath sounds: Normal breath sounds.  Musculoskeletal:     Cervical back: No swelling, deformity, signs of trauma, spasms, torticollis, tenderness or bony tenderness. No pain with movement. Decreased range of motion (decreased rotation to right. ).       Back:  Lymphadenopathy:     Cervical: No cervical adenopathy.  Neurological:     Mental Status: She is alert and oriented to person, place, and time.    Psychiatric:        Mood and Affect: Mood normal.        Behavior: Behavior normal.     BP 115/66   Pulse 85   Temp 97.8 F (36.6 C) (Tympanic)   Ht _0  (1.499 m)   Wt 127 lb 6.4 oz (57.8 kg)   SpO2 99%  BMI 25.73 kg/m  Wt Readings from Last 3 Encounters:  10/23/19 127 lb 6.4 oz (57.8 kg)  01/09/19 124 lb 3.2 oz (56.3 kg)  10/08/18 122 lb 6.4 oz (55.5 kg)     Health Maintenance Due  Topic Date Due  . TETANUS/TDAP  Never done  . PAP SMEAR-Modifier  Never done  . OPHTHALMOLOGY EXAM  07/07/2015  . URINE MICROALBUMIN  07/02/2019  . FOOT EXAM  07/05/2019  . INFLUENZA VACCINE  09/29/2019    There are no preventive care reminders to display for this patient.  Lab Results  Component Value Date   TSH 2.04 01/27/2017   Lab Results  Component Value Date   WBC 5.6 01/04/2019   HGB 12.5 01/04/2019   HCT 37.2 01/04/2019   MCV 83.4 01/04/2019   PLT 271.0 01/04/2019   Lab Results  Component Value Date   NA 138 05/06/2019   K 4.4 05/06/2019   CO2 30 05/06/2019   GLUCOSE 118 (H) 05/06/2019   BUN 25 (H) 05/06/2019   CREATININE 0.66 05/06/2019   BILITOT 0.3 01/04/2019   ALKPHOS 70 01/04/2019   AST 17 01/04/2019   ALT 18 01/04/2019   PROT 6.9 01/04/2019   ALBUMIN 4.2 01/04/2019   CALCIUM 9.5 05/06/2019   ANIONGAP 7 05/20/2016   GFR 95.88 05/06/2019   Lab Results  Component Value Date   CHOL 178 05/06/2019   Lab Results  Component Value Date   HDL 54.70 05/06/2019   Lab Results  Component Value Date   LDLCALC 104 (H) 05/06/2019   Lab Results  Component Value Date   TRIG 99.0 05/06/2019   Lab Results  Component Value Date   CHOLHDL 3 05/06/2019   Lab Results  Component Value Date   HGBA1C 6.4 05/06/2019      Assessment & Plan:   Problem List Items Addressed This Visit    None    Visit Diagnoses    Strain of neck muscle, initial encounter    -  Primary   Relevant Medications   methocarbamol (ROBAXIN) 500 MG tablet      Meds ordered  this encounter  Medications  . methocarbamol (ROBAXIN) 500 MG tablet    Sig: Take 1 tablet (500 mg total) by mouth every 8 (eight) hours as needed for muscle spasms.    Dispense:  40 tablet    Refill:  0    Follow-up: Return if symptoms worsen or fail to improve.  Information given on exercises to do for her neck.  She will use Voltaren gel and the Robaxin.  Follow-up if not improving in a week or so.  Libby Maw, MD

## 2019-10-23 NOTE — Patient Instructions (Signed)

## 2019-11-05 ENCOUNTER — Other Ambulatory Visit: Payer: Self-pay | Admitting: Endocrinology

## 2019-11-05 DIAGNOSIS — E119 Type 2 diabetes mellitus without complications: Secondary | ICD-10-CM

## 2019-11-05 DIAGNOSIS — E538 Deficiency of other specified B group vitamins: Secondary | ICD-10-CM

## 2019-11-05 DIAGNOSIS — E782 Mixed hyperlipidemia: Secondary | ICD-10-CM

## 2019-11-05 DIAGNOSIS — E559 Vitamin D deficiency, unspecified: Secondary | ICD-10-CM

## 2019-11-06 ENCOUNTER — Other Ambulatory Visit: Payer: Self-pay

## 2019-11-06 ENCOUNTER — Other Ambulatory Visit (INDEPENDENT_AMBULATORY_CARE_PROVIDER_SITE_OTHER): Payer: 59

## 2019-11-06 DIAGNOSIS — E559 Vitamin D deficiency, unspecified: Secondary | ICD-10-CM

## 2019-11-06 DIAGNOSIS — E538 Deficiency of other specified B group vitamins: Secondary | ICD-10-CM

## 2019-11-06 DIAGNOSIS — E119 Type 2 diabetes mellitus without complications: Secondary | ICD-10-CM

## 2019-11-06 LAB — MICROALBUMIN / CREATININE URINE RATIO
Creatinine,U: 49.7 mg/dL
Microalb Creat Ratio: 1.4 mg/g (ref 0.0–30.0)
Microalb, Ur: <0.7 mg/dL (ref 0.0–1.9)

## 2019-11-06 LAB — CBC
HCT: 37.8 % (ref 36.0–46.0)
Hemoglobin: 12.6 g/dL (ref 12.0–15.0)
MCHC: 33.3 g/dL (ref 30.0–36.0)
MCV: 83.5 fl (ref 78.0–100.0)
Platelets: 297 10*3/uL (ref 150.0–400.0)
RBC: 4.52 Mil/uL (ref 3.87–5.11)
RDW: 15 % (ref 11.5–15.5)
WBC: 6.6 10*3/uL (ref 4.0–10.5)

## 2019-11-06 LAB — COMPREHENSIVE METABOLIC PANEL
ALT: 15 U/L (ref 0–35)
AST: 19 U/L (ref 0–37)
Albumin: 4.3 g/dL (ref 3.5–5.2)
Alkaline Phosphatase: 81 U/L (ref 39–117)
BUN: 18 mg/dL (ref 6–23)
CO2: 28 mEq/L (ref 19–32)
Calcium: 9.4 mg/dL (ref 8.4–10.5)
Chloride: 104 mEq/L (ref 96–112)
Creatinine, Ser: 0.65 mg/dL (ref 0.40–1.20)
GFR: 97.37 mL/min (ref >60.00)
Glucose, Bld: 121 mg/dL — ABNORMAL HIGH (ref 70–99)
Potassium: 4.5 mEq/L (ref 3.5–5.1)
Sodium: 139 mEq/L (ref 135–145)
Total Bilirubin: 0.3 mg/dL (ref 0.2–1.2)
Total Protein: 7.1 g/dL (ref 6.0–8.3)

## 2019-11-06 LAB — LDL CHOLESTEROL, DIRECT: Direct LDL: 105 mg/dL

## 2019-11-06 LAB — VITAMIN D 25 HYDROXY (VIT D DEFICIENCY, FRACTURES): VITD: 34.32 ng/mL (ref 30.00–100.00)

## 2019-11-06 LAB — LIPID PANEL
Cholesterol: 178 mg/dL (ref 0–200)
HDL: 51.4 mg/dL (ref >39.00)
NonHDL: 126.55
Total CHOL/HDL Ratio: 3
Triglycerides: 219 mg/dL — ABNORMAL HIGH (ref 0.0–149.0)
VLDL: 43.8 mg/dL — ABNORMAL HIGH (ref 0.0–40.0)

## 2019-11-06 LAB — TSH: TSH: 1.68 u[IU]/mL (ref 0.35–4.50)

## 2019-11-06 LAB — HEMOGLOBIN A1C: Hgb A1c MFr Bld: 6.6 % — ABNORMAL HIGH (ref 4.6–6.5)

## 2019-11-12 ENCOUNTER — Encounter: Payer: Self-pay | Admitting: Endocrinology

## 2019-11-12 ENCOUNTER — Other Ambulatory Visit: Payer: Self-pay

## 2019-11-12 ENCOUNTER — Ambulatory Visit: Payer: 59 | Admitting: Endocrinology

## 2019-11-12 VITALS — BP 116/72 | HR 90 | Wt 124.0 lb

## 2019-11-12 DIAGNOSIS — E1165 Type 2 diabetes mellitus with hyperglycemia: Secondary | ICD-10-CM | POA: Diagnosis not present

## 2019-11-12 DIAGNOSIS — E782 Mixed hyperlipidemia: Secondary | ICD-10-CM | POA: Diagnosis not present

## 2019-11-12 DIAGNOSIS — Z23 Encounter for immunization: Secondary | ICD-10-CM

## 2019-11-12 MED ORDER — FLUVASTATIN SODIUM 20 MG PO CAPS: 20.0000 mg | ORAL_CAPSULE | Freq: Every day | ORAL | 1 refills | Status: DC

## 2019-11-12 MED ORDER — GLYXAMBI 25-5 MG PO TABS: 1.0000 | ORAL_TABLET | Freq: Every day | ORAL | 2 refills | Status: DC

## 2019-11-12 MED ORDER — GLUCOSE BLOOD VI STRP: ORAL_STRIP | 2 refills | Status: AC

## 2019-11-12 NOTE — Progress Notes (Signed)
Anna Jennings ID: Anna Jennings, female   DOB: [birth year], 49 y.o.   MRN: [id number]   Reason for Appointment : Followup for Type 2 Diabetes  History of Present Illness          Diagnosis: Type 2 diabetes mellitus, date of diagnosis: 2007       Past history: Anna Jennings was initially diagnosed with a two-hour postprandial glucose of 218 in 08/2005 and baseline A1c of 5.7 Apparently Anna Jennings was not treated with oral hypoglycemic drugs initially and started on metformin in 2009. Anna Jennings did have diarrhea with the regular metformin but tolerated extended release preparation better with no side effects even on 2000 mg Anna Jennings thinks Anna Jennings blood sugars are fairly well controlled initially but a couple of years later with higher readings Anna Jennings was given Amaryl also. The dose of this was gradually increased Anna Jennings A1c readings have been ranging from 6.5-7.2 in between 2011 and 2013 but no further followup done after 11/2011 Anna Jennings had been out of Anna Jennings medications prior to Anna Jennings initial visit here in 12/14 and Anna Jennings blood sugars were progressively higher.  Anna Jennings had side effects of  bloating and swelling with pioglitazone and Anna Jennings Anna Jennings Because of poor control and A1c of 8.5 in 3/15 Anna Jennings was started on Levemir insulin  Recent history:   Oral hypoglycemic drugs the Anna Jennings is taking are: Glyxambi 25/5,  metformin ER 2 g qd  A1c is 6.6, 6.1 on Anna Jennings last visit  Current management, blood sugar patterns from Anna Jennings Dexcom download and problems identified are:   Anna Jennings has not been seen in follow-up since 11/20 and Anna Jennings A1c appears to be gradually increased  Anna Jennings had labs done in March but was unable to download Anna Jennings Dexcom at that time  More recently Anna Jennings thinks that Anna Jennings has been traveling at home eating on the run  Also at times will not get any protein in the morning at breakfast time only eating bread  HIGHEST blood sugars are after breakfast and are averaging about 185  Usually not getting significantly high readings  later in the day only occasionally at bedtime based on Anna Jennings diet  Was told to start Prandin 1 mg at breakfast previously but this tends to cause low sugars in the early afternoon, has not taken this  This year Anna Jennings has not exercised consistently, previously had an injury  More recently is playing tennis twice a week but no other formal exercise  Anna Jennings weight is the same as last year  Anna Jennings will sometimes use the One Touch Verio monitor for blood sugar testing         Side effects from medications have been: Diarrhea from regular metformin   Glucose monitoring:    With Dexcom and One Touch Verio  Blood sugars from One Touch meter:  PRE-MEAL Fasting  afternoon Dinner Bedtime Overall  Glucose range:  121-187  176  199  139-188   Mean/median:        Dexcom data for 2 weeks:  CGM use % of time  94  2-week average/SD  157, GV 19  Time in range      84%  % Time Above 180  16  % Time above 250   % Time Below 70 0    Previous data:   CGM use % of time  71  Average and SD 140       Time in range  89%  % Time Above 180  10  % Time above  250   % Time Below target 0       Wt Readings from Last 3 Encounters:  11/12/19 124 lb (56.2 kg)  10/23/19 127 lb 6.4 oz (57.8 kg)  01/09/19 124 lb 3.2 oz (56.3 kg)     Glycemic control:  Lab Results  Component Value Date   HGBA1C 6.6 (H) 11/06/2019   HGBA1C 6.4 05/06/2019   HGBA1C 6.1 01/04/2019   Lab Results  Component Value Date   MICROALBUR <0.7 11/06/2019   LDLCALC 104 (H) 05/06/2019   CREATININE 0.65 11/06/2019   Lab Results  Component Value Date   FRUCTOSAMINE 248 09/05/2016   FRUCTOSAMINE 322 (H) 08/17/2015    Self-care: The diet that the Anna Jennings has been following is: tries to limit portions, usually has some carbohydrate at every meal     Meals: 2 meals per day.  no breakfast, am snack, sandwich at lunch;  Some rice at meals Occasionally which Anna Jennings thinks tends to raise Anna Jennings sugar for couple of days           Exercise: walking  regularly, counting steps and usually trying to go up to 10,000 steps a day  Dietician visit: Most recent:2011?                 Compliance with the medical regimen: Good   Weight history:  Wt Readings from Last 3 Encounters:  11/12/19 124 lb (56.2 kg)  10/23/19 127 lb 6.4 oz (57.8 kg)  01/09/19 124 lb 3.2 oz (56.3 kg)    Lab on 11/06/2019  Component Date Value Ref Range Status  . WBC 11/06/2019 6.6  4.0 - 10.5 K/uL Final  . RBC 11/06/2019 4.52  3.87 - 5.11 Mil/uL Final  . Platelets 11/06/2019 297.0  150 - 400 K/uL Final  . Hemoglobin 11/06/2019 12.6  12.0 - 15.0 g/dL Final  . HCT 11/06/2019 37.8  36 - 46 % Final  . MCV 11/06/2019 83.5  78.0 - 100.0 fl Final  . MCHC 11/06/2019 33.3  30.0 - 36.0 g/dL Final  . RDW 11/06/2019 15.0  11.5 - 15.5 % Final  . VITD 11/06/2019 34.32  30.00 - 100.00 ng/mL Final  . TSH 11/06/2019 1.68  0.35 - 4.50 uIU/mL Final  . Cholesterol 11/06/2019 178  0 - 200 mg/dL Final   ATP III Classification       Desirable:  < 200 mg/dL               Borderline High:  200 - 239 mg/dL          High:  > = 240 mg/dL  . Triglycerides 11/06/2019 219.0* 0 - 149 mg/dL Final   Normal:  <150 mg/dLBorderline High:  150 - 199 mg/dL  . HDL 11/06/2019 51.40  >39.00 mg/dL Final  . VLDL 11/06/2019 43.8* 0.0 - 40.0 mg/dL Final  . Total CHOL/HDL Ratio 11/06/2019 3   Final                  Men          Women1/2 Average Risk     3.4          3.3Average Risk          5.0          4.42X Average Risk          9.6          7.13X Average Risk          15.0  11.0                      . NonHDL 11/06/2019 126.55   Final   NOTE:  Non-HDL goal should be 30 mg/dL higher than Anna Jennings's LDL goal (i.e. LDL goal of < 70 mg/dL, would have non-HDL goal of < 100 mg/dL)  . Microalb, Ur 11/06/2019 <0.7  0.0 - 1.9 mg/dL Final  . Creatinine,U 11/06/2019 49.7  mg/dL Final  . Microalb Creat Ratio 11/06/2019 1.4  0.0 - 30.0 mg/g Final  . Sodium 11/06/2019 139  135 - 145 mEq/L  Final  . Potassium 11/06/2019 4.5  3.5 - 5.1 mEq/L Final  . Chloride 11/06/2019 104  96 - 112 mEq/L Final  . CO2 11/06/2019 28  19 - 32 mEq/L Final  . Glucose, Bld 11/06/2019 121* 70 - 99 mg/dL Final  . BUN 11/06/2019 18  6 - 23 mg/dL Final  . Creatinine, Ser 11/06/2019 0.65  0.40 - 1.20 mg/dL Final  . Total Bilirubin 11/06/2019 0.3  0.2 - 1.2 mg/dL Final  . Alkaline Phosphatase 11/06/2019 81  39 - 117 U/L Final  . AST 11/06/2019 19  0 - 37 U/L Final  . ALT 11/06/2019 15  0 - 35 U/L Final  . Total Protein 11/06/2019 7.1  6.0 - 8.3 g/dL Final  . Albumin 11/06/2019 4.3  3.5 - 5.2 g/dL Final  . GFR 11/06/2019 97.37  >60.00 mL/min Final  . Calcium 11/06/2019 9.4  8.4 - 10.5 mg/dL Final  . Hgb A1c MFr Bld 11/06/2019 6.6* 4.6 - 6.5 % Final   Glycemic Control Guidelines for People with Diabetes:Non Diabetic:  <6%Goal of Therapy: <7%Additional Action Suggested:  >8%   . Direct LDL 11/06/2019 105.0  mg/dL Final   Optimal:  <100 mg/dLNear or Above Optimal:  100-129 mg/dLBorderline High:  130-159 mg/dLHigh:  160-189 mg/dLVery High:  >190 mg/dL    Allergies as of 11/12/2019      Reactions   Sulfa Antibiotics Nausea Only   Still able to take but makes nausea      Medication List       Accurate as of November 12, 2019 11:59 PM. If you have any questions, ask your nurse or doctor.        cetirizine 10 MG tablet Commonly known as: ZYRTEC Take 20 mg by mouth daily.   Dexcom G6 Sensor Misc 1 each by Does not apply route See admin instructions. Use one sensor once every 10 days to monitor blood sugar.   Dexcom G6 Transmitter Misc 1 each by Does not apply route See admin instructions. Use one transmitter once every 90 days to transmit blood sugar.   diphenhydrAMINE 25 MG tablet Commonly known as: BENADRYL Take 25 mg by mouth at bedtime as needed.   fluvastatin 20 MG capsule Commonly known as: LESCOL Take 1 capsule (20 mg total) by mouth at bedtime. Started by: Elayne Snare, MD    glucose blood test strip Use Onetouch verio test strips as instructed to check blood sugar three times daily.   Glyxambi 25-5 MG Tabs Generic drug: Empagliflozin-linaGLIPtin Take 1 tablet by mouth daily.   levocetirizine 5 MG tablet Commonly known as: XYZAL Take 5 mg by mouth every evening.   metFORMIN 500 MG 24 hr tablet Commonly known as: GLUCOPHAGE-XR TAKE 2 TABLETS BY MOUTH  TWICE DAILY   methocarbamol 500 MG tablet Commonly known as: Robaxin Take 1 tablet (500 mg total) by mouth every 8 (eight) hours as needed  for muscle spasms.   OneTouch Delica Lancets 07H Misc 1 each by Does not apply route 3 (three) times daily. Use onetouch delica lancets to check blood sugar three times daily.   OneTouch Verio Flex System w/Device Kit Use as instructed to calibrate Dexcom system up to three times daily.       Allergies:  Allergies  Allergen Reactions  . Sulfa Antibiotics Nausea Only    Still able to take but makes nausea    Past Medical History:  Diagnosis Date  . Diabetes mellitus without complication [facility])     Past Surgical History:  Procedure Laterality Date  . APPENDECTOMY    . CHOLECYSTECTOMY    . PANCREAS SURGERY     Tumor head of pancreas, incompletely resected    Family History  Problem Relation Age of Onset  . Hyperlipidemia Father   . Hypertension Father   . Coronary artery disease Father   . Hearing loss Father   . Diabetes Brother   . Heart disease Paternal Grandfather     Social History:  reports that Anna Jennings has never smoked. Anna Jennings has never used smokeless tobacco. Anna Jennings reports current alcohol use. Anna Jennings reports that Anna Jennings does not use drugs.    Review of Systems       Lipids: was on Lipitor  previously when Anna Jennings Jennings it because of generalized aches and pains which were significant  Anna Jennings  does have a fairly significant family history of CAD including Anna Jennings father  Anna Jennings was taking pravastatin 20 mg and this was also Jennings  in December 2017 because of  muscle aching  . Anna Jennings may be inconsistent with diet with eating out more recently Recent labs are nonfasting  LDL is as follows:  Lab Results  Component Value Date   CHOL 178 11/06/2019   CHOL 178 05/06/2019   CHOL 171 10/04/2018   Lab Results  Component Value Date   HDL 51.40 11/06/2019   HDL 54.70 05/06/2019   HDL 52.30 10/04/2018   Lab Results  Component Value Date   LDLCALC 104 (H) 05/06/2019   LDLCALC 94 10/04/2018   LDLCALC 95 03/02/2018   Lab Results  Component Value Date   TRIG 219.0 (H) 11/06/2019   TRIG 99.0 05/06/2019   TRIG 121.0 10/04/2018   Lab Results  Component Value Date   CHOLHDL 3 11/06/2019   CHOLHDL 3 05/06/2019   CHOLHDL 3 10/04/2018   Lab Results  Component Value Date   LDLDIRECT 105.0 11/06/2019   LDLDIRECT 134.4 08/19/2013       Anna Jennings has had severe vitamin D deficiency diagnosed in 2009 with a level of 5.5. Anna Jennings is taking Anna Jennings recommended dose of vitamin D3, 4000 units with normal levels   Lab Results  Component Value Date   VD25OH 34.32 11/06/2019   VD25OH 48.67 01/04/2019   VD25OH 51.61 03/02/2018   VD25OH 41.60 09/05/2016    Anna Jennings had fatigue and Anna Jennings B12 level was low previously at 143 Anna Jennings has therapeutic level with B12 supplement  Lab Results  Component Value Date   VITAMINB12 904 01/04/2019      Physical Examination:  BP 116/72   Pulse 90   Wt 124 lb (56.2 kg)   SpO2 98%   BMI 25.04 kg/m    ASSESSMENT/PLAN:   Diabetes type 2 with BMI 25  See history of present illness for detailed discussion of current management, blood sugar patterns and problems identified  Anna Jennings has an A1c of 6.6, previously down to 6.1  Anna Jennings blood sugar patterns were analyzed by Dexcom sensor and also from Anna Jennings meter  Although Anna Jennings blood sugars overall well controlled Anna Jennings will have some high postprandial readings after eating breakfast especially when not eating protein in the morning As above Anna Jennings can do better with cutting back on  carbohydrates, balanced meals and more exercise Anna Jennings may not be able to get Anna Jennings Dexcom through the insurance company in the future but advised Anna Jennings to check readings with the One Touch meter especially before and after breakfast and after some meals in the evenings also  Anna Jennings will try to start Anna Jennings walking regimen Needs more regular follow-up  LIPIDS: Discussed need for cardiovascular risk reduction with Anna Jennings diabetes, positive family history and Anna Jennings approaching menopause Anna Jennings agrees to try Lescol 20 mg 2 to 3 days a week  Vitamin D deficiency: Although Anna Jennings level is in the low 30s Anna Jennings is still taking 4000 units vitamin D3 and can continue  Influenza vaccine given  Anna Jennings Instructions  Less carbs and some protein at each meal    Follow-up in 4 months  Aris Moman 11/13/2019, 3:18 PM

## 2019-11-12 NOTE — Patient Instructions (Signed)
Less carbs and some protein at each meal

## 2020-01-06 ENCOUNTER — Other Ambulatory Visit: Payer: Self-pay

## 2020-01-07 ENCOUNTER — Encounter: Payer: Self-pay | Admitting: Family

## 2020-01-07 ENCOUNTER — Ambulatory Visit: Payer: 59 | Admitting: Family

## 2020-01-07 VITALS — BP 146/82 | HR 91 | Temp 97.5°F | Ht 59.0 in | Wt 122.8 lb

## 2020-01-07 DIAGNOSIS — R000 Tachycardia, unspecified: Secondary | ICD-10-CM

## 2020-01-07 NOTE — Patient Instructions (Signed)
Sinus Tachycardia  Sinus tachycardia is a kind of fast heartbeat. In sinus tachycardia, the heart beats more than 100 times a minute. Sinus tachycardia starts in a part of the heart called the sinus node. Sinus tachycardia may be harmless, or it may be a sign of a serious condition. What are the causes? This condition may be caused by:  Exercise or exertion.  A fever.  Pain.  Loss of body fluids (dehydration).  Severe bleeding (hemorrhage).  Anxiety and stress.  Certain substances, including: ? Alcohol. ? Caffeine. ? Tobacco and nicotine products. ? Cold medicines. ? Illegal drugs.  Medical conditions including: ? Heart disease. ? An infection. ? An overactive thyroid (hyperthyroidism). ? A lack of red blood cells (anemia). What are the signs or symptoms? Symptoms of this condition include:  A feeling that the heart is beating quickly (palpitations).  Suddenly noticing your heartbeat (cardiac awareness).  Dizziness.  Tiredness (fatigue).  Shortness of breath.  Chest pain.  Nausea.  Fainting. How is this diagnosed? This condition is diagnosed with:  A physical exam.  Other tests, such as: ? Blood tests. ? An electrocardiogram (ECG). This test measures the electrical activity of the heart. ? Ambulatory cardiac monitor. This records your heartbeats for 24 hours or more. You may be referred to a heart specialist (cardiologist). How is this treated? Treatment for this condition depends on the cause or the underlying condition. Treatment may involve:  Treating the underlying condition.  Taking new medicines or changing your current medicines as told by your health care provider.  Making changes to your diet or lifestyle. Follow these instructions at home: Lifestyle   Do not use any products that contain nicotine or tobacco, such as cigarettes and e-cigarettes. If you need help quitting, ask your health care provider.  Do not use illegal drugs, such as  cocaine.  Learn relaxation methods to help you when you get stressed or anxious. These include deep breathing.  Avoid caffeine or other stimulants. Alcohol use   Do not drink alcohol if: ? Your health care provider tells you not to drink. ? You are pregnant, may be pregnant, or are planning to become pregnant.  If you drink alcohol, limit how much you have: ? 0-1 drink a day for women. ? 0-2 drinks a day for men.  Be aware of how much alcohol is in your drink. In the [place] one drink equals one typical bottle of beer (12 oz), one-half glass of wine (5 oz), or one shot of hard liquor (1 oz). General instructions  Drink enough fluids to keep your urine pale yellow.  Take over-the-counter and prescription medicines only as told by your health care provider.  Keep all follow-up visits as told by your health care provider. This is important. Contact a health care provider if you have:  A fever.  Vomiting or diarrhea that does not go away. Get help right away if you:  Have pain in your chest, upper arms, jaw, or neck.  Become weak or dizzy.  Feel faint.  Have palpitations that do not go away. Summary  In sinus tachycardia, the heart beats more than 100 times a minute.  Sinus tachycardia may be harmless, or it may be a sign of a serious condition.  Treatment for this condition depends on the cause or the underlying condition.  Get help right away if you have pain in your chest, upper arms, jaw, or neck. This information is not intended to replace advice given to you by   your health care provider. Make sure you discuss any questions you have with your health care provider. Document Revised: 04/05/2017 Document Reviewed: 04/05/2017 Elsevier Patient Education  2020 Elsevier Inc.  

## 2020-01-07 NOTE — Progress Notes (Signed)
Acute Office Visit  Subjective:    Patient ID: Anna Jennings, female    DOB: 30-Mar-1970, 49 y.o.   MRN: [id number]  Chief Complaint  Patient presents with  . Tachycardia    concerns about tachycardia espisode that happened 3 weeks ago.     HPI Patient is in today with concerns of tachycardia tat occurred about 3 weeks ago when she was outside at a football game. She could fele her heart beating fast and her watch alerted her that her heart rate was 127. This last for about 3 hours. She never had chest pain or SOB. Overall reports feeling "weird." She has not had the symptoms again but her insurance company told her to have it checked out. She has a family history of cardiovascular disease in her father in his mid-19's. Otherwise stable. No history of thyroid disorder.   Past Medical History:  Diagnosis Date  . Diabetes mellitus without complication [facility])     Past Surgical History:  Procedure Laterality Date  . APPENDECTOMY    . CHOLECYSTECTOMY    . PANCREAS SURGERY     Tumor head of pancreas, incompletely resected    Family History  Problem Relation Age of Onset  . Hyperlipidemia Father   . Hypertension Father   . Coronary artery disease Father   . Hearing loss Father   . Diabetes Brother   . Heart disease Paternal Grandfather     Social History   Socioeconomic History  . Marital status: Married    Spouse name: Not on file  . Number of children: Not on file  . Years of education: Not on file  . Highest education level: Not on file  Occupational History  . Not on file  Tobacco Use  . Smoking status: Never Smoker  . Smokeless tobacco: Never Used  Substance and Sexual Activity  . Alcohol use: Yes    Comment: Socially  . Drug use: No  . Sexual activity: Yes    Birth control/protection: None  Other Topics Concern  . Not on file  Social History Narrative  . Not on file   Social Determinants of Health   Financial Resource Strain:   . Difficulty of Paying  Living Expenses: Not on file  Food Insecurity:   . Worried About Charity fundraiser in the Last Year: Not on file  . Ran Out of Food in the Last Year: Not on file  Transportation Needs:   . Lack of Transportation (Medical): Not on file  . Lack of Transportation (Non-Medical): Not on file  Physical Activity:   . Days of Exercise per Week: Not on file  . Minutes of Exercise per Session: Not on file  Stress:   . Feeling of Stress : Not on file  Social Connections:   . Frequency of Communication with Friends and Family: Not on file  . Frequency of Social Gatherings with Friends and Family: Not on file  . Attends Religious Services: Not on file  . Active Member of Clubs or Organizations: Not on file  . Attends Archivist Meetings: Not on file  . Marital Status: Not on file  Intimate Partner Violence:   . Fear of Current or Ex-Partner: Not on file  . Emotionally Abused: Not on file  . Physically Abused: Not on file  . Sexually Abused: Not on file    Outpatient Medications Prior to Visit  Medication Sig Dispense Refill  . Blood Glucose Monitoring Suppl ([place]) w/Device  KIT Use as instructed to calibrate Dexcom system up to three times daily. 1 kit 0  . cetirizine (ZYRTEC) 10 MG tablet Take 20 mg by mouth daily.     . Continuous Blood Gluc Sensor (DEXCOM G6 SENSOR) MISC 1 each by Does not apply route See admin instructions. Use one sensor once every 10 days to monitor blood sugar.    . Continuous Blood Gluc Transmit (DEXCOM G6 TRANSMITTER) MISC 1 each by Does not apply route See admin instructions. Use one transmitter once every 90 days to transmit blood sugar.    . diphenhydrAMINE (BENADRYL) 25 MG tablet Take 25 mg by mouth at bedtime as needed.    . Empagliflozin-linaGLIPtin (GLYXAMBI) 25-5 MG TABS Take 1 tablet by mouth daily. 30 tablet 2  . glucose blood test strip Use Onetouch verio test strips as instructed to check blood sugar three times daily. 100  each 2  . levocetirizine (XYZAL) 5 MG tablet Take 5 mg by mouth every evening.    . metFORMIN (GLUCOPHAGE-XR) 500 MG 24 hr tablet TAKE 2 TABLETS BY MOUTH  TWICE DAILY 360 tablet 1  . OneTouch Delica Lancets 10C MISC 1 each by Does not apply route 3 (three) times daily. Use onetouch delica lancets to check blood sugar three times daily. 100 each 2  . fluvastatin (LESCOL) 20 MG capsule Take 1 capsule (20 mg total) by mouth at bedtime. 30 capsule 1  . methocarbamol (ROBAXIN) 500 MG tablet Take 1 tablet (500 mg total) by mouth every 8 (eight) hours as needed for muscle spasms. (Patient not taking: Reported on 01/07/2020) 40 tablet 0   No facility-administered medications prior to visit.    Allergies  Allergen Reactions  . Sulfa Antibiotics Nausea Only    Still able to take but makes nausea    Review of Systems  Respiratory: Negative for shortness of breath and wheezing.   Cardiovascular: Positive for palpitations. Negative for chest pain and leg swelling.  Gastrointestinal: Negative.   Endocrine: Negative.   Musculoskeletal: Negative.   Skin: Negative.   Allergic/Immunologic: Negative.   Neurological: Negative.   Psychiatric/Behavioral: Negative.   All other systems reviewed and are negative.      Objective:    Physical Exam Vitals reviewed.  Constitutional:      Appearance: She is normal weight.  HENT:     Head: Normocephalic.     Right Ear: External ear normal.     Left Ear: External ear normal.  Cardiovascular:     Rate and Rhythm: Normal rate and regular rhythm.     Pulses: Normal pulses.     Heart sounds: No murmur heard.  No friction rub. No gallop.   Pulmonary:     Effort: Pulmonary effort is normal.     Breath sounds: Normal breath sounds.  Abdominal:     General: Abdomen is flat. Bowel sounds are normal.     Palpations: Abdomen is soft.  Musculoskeletal:     Cervical back: Normal range of motion and neck supple.  Skin:    General: Skin is warm and dry.    Neurological:     General: No focal deficit present.     Mental Status: She is alert and oriented to person, place, and time.  Psychiatric:        Mood and Affect: Mood normal.     BP (!) 146/82   Pulse 91   Temp (!) 97.5 F (36.4 C) (Tympanic)   Ht _0  (1.499 m)  Wt 122 lb 12.8 oz (55.7 kg)   SpO2 98%   BMI 24.80 kg/m  Wt Readings from Last 3 Encounters:  01/07/20 122 lb 12.8 oz (55.7 kg)  11/12/19 124 lb (56.2 kg)  10/23/19 127 lb 6.4 oz (57.8 kg)    Health Maintenance Due  Topic Date Due  . TETANUS/TDAP  Never done  . PAP SMEAR-Modifier  Never done  . OPHTHALMOLOGY EXAM  07/07/2015  . FOOT EXAM  07/05/2019    There are no preventive care reminders to display for this patient.   Lab Results  Component Value Date   TSH 1.68 11/06/2019   Lab Results  Component Value Date   WBC 6.6 11/06/2019   HGB 12.6 11/06/2019   HCT 37.8 11/06/2019   MCV 83.5 11/06/2019   PLT 297.0 11/06/2019   Lab Results  Component Value Date   NA 139 11/06/2019   K 4.5 11/06/2019   CO2 28 11/06/2019   GLUCOSE 121 (H) 11/06/2019   BUN 18 11/06/2019   CREATININE 0.65 11/06/2019   BILITOT 0.3 11/06/2019   ALKPHOS 81 11/06/2019   AST 19 11/06/2019   ALT 15 11/06/2019   PROT 7.1 11/06/2019   ALBUMIN 4.3 11/06/2019   CALCIUM 9.4 11/06/2019   ANIONGAP 7 05/20/2016   GFR 97.37 11/06/2019   Lab Results  Component Value Date   CHOL 178 11/06/2019   Lab Results  Component Value Date   HDL 51.40 11/06/2019   Lab Results  Component Value Date   LDLCALC 104 (H) 05/06/2019   Lab Results  Component Value Date   TRIG 219.0 (H) 11/06/2019   Lab Results  Component Value Date   CHOLHDL 3 11/06/2019   Lab Results  Component Value Date   HGBA1C 6.6 (H) 11/06/2019       Assessment & Plan:   Problem List Items Addressed This Visit    None    Visit Diagnoses    Tachycardia    -  Primary   Relevant Orders   EKG 12-Lead (Completed)   Comp Met (CMET)   Thyroid Panel  With TSH   CBC w/Diff       Will obtain labs. Overall, it appears to be an isolated incident. Possible dehydration and warm weather triggered the episode. Will follow-up pending results.    Kennyth Arnold, FNP

## 2020-01-08 LAB — CBC WITH DIFFERENTIAL/PLATELET
Basophils Absolute: 0 10*3/uL (ref 0.0–0.1)
Basophils Relative: 0.9 % (ref 0.0–3.0)
Eosinophils Absolute: 0.1 10*3/uL (ref 0.0–0.7)
Eosinophils Relative: 2.3 % (ref 0.0–5.0)
HCT: 38.6 % (ref 36.0–46.0)
Hemoglobin: 12.8 g/dL (ref 12.0–15.0)
Lymphocytes Relative: 31.4 % (ref 12.0–46.0)
Lymphs Abs: 1.5 10*3/uL (ref 0.7–4.0)
MCHC: 33.3 g/dL (ref 30.0–36.0)
MCV: 84.1 fl (ref 78.0–100.0)
Monocytes Absolute: 0.3 10*3/uL (ref 0.1–1.0)
Monocytes Relative: 6.6 % (ref 3.0–12.0)
Neutro Abs: 2.9 10*3/uL (ref 1.4–7.7)
Neutrophils Relative %: 58.8 % (ref 43.0–77.0)
Platelets: 289 10*3/uL (ref 150.0–400.0)
RBC: 4.59 Mil/uL (ref 3.87–5.11)
RDW: 14 % (ref 11.5–15.5)
WBC: 4.9 10*3/uL (ref 4.0–10.5)

## 2020-01-08 LAB — THYROID PANEL WITH TSH
Free Thyroxine Index: 2.8 (ref 1.4–3.8)
T3 Uptake: 30 % (ref 22–35)
T4, Total: 9.3 ug/dL (ref 5.1–11.9)
TSH: 2.48 m[IU]/L

## 2020-01-08 LAB — COMPREHENSIVE METABOLIC PANEL
ALT: 12 U/L (ref 0–35)
AST: 17 U/L (ref 0–37)
Albumin: 4.5 g/dL (ref 3.5–5.2)
Alkaline Phosphatase: 77 U/L (ref 39–117)
BUN: 18 mg/dL (ref 6–23)
CO2: 32 mEq/L (ref 19–32)
Calcium: 9.5 mg/dL (ref 8.4–10.5)
Chloride: 102 mEq/L (ref 96–112)
Creatinine, Ser: 0.56 mg/dL (ref 0.40–1.20)
GFR: 108.27 mL/min (ref >60.00)
Glucose, Bld: 89 mg/dL (ref 70–99)
Potassium: 3.9 mEq/L (ref 3.5–5.1)
Sodium: 141 mEq/L (ref 135–145)
Total Bilirubin: 0.4 mg/dL (ref 0.2–1.2)
Total Protein: 7.1 g/dL (ref 6.0–8.3)

## 2020-02-04 ENCOUNTER — Other Ambulatory Visit: Payer: 59

## 2020-02-05 ENCOUNTER — Other Ambulatory Visit: Payer: 59

## 2020-02-05 ENCOUNTER — Other Ambulatory Visit: Payer: Self-pay

## 2020-02-05 ENCOUNTER — Other Ambulatory Visit (INDEPENDENT_AMBULATORY_CARE_PROVIDER_SITE_OTHER): Payer: 59

## 2020-02-05 DIAGNOSIS — E1165 Type 2 diabetes mellitus with hyperglycemia: Secondary | ICD-10-CM | POA: Diagnosis not present

## 2020-02-05 DIAGNOSIS — E782 Mixed hyperlipidemia: Secondary | ICD-10-CM | POA: Diagnosis not present

## 2020-02-05 LAB — LIPID PANEL
Cholesterol: 204 mg/dL — ABNORMAL HIGH (ref 0–200)
HDL: 67.8 mg/dL (ref >39.00)
LDL Cholesterol: 115 mg/dL — ABNORMAL HIGH (ref 0–99)
NonHDL: 136.27
Total CHOL/HDL Ratio: 3
Triglycerides: 104 mg/dL (ref 0.0–149.0)
VLDL: 20.8 mg/dL (ref 0.0–40.0)

## 2020-02-05 LAB — COMPREHENSIVE METABOLIC PANEL
ALT: 13 U/L (ref 0–35)
AST: 19 U/L (ref 0–37)
Albumin: 4.7 g/dL (ref 3.5–5.2)
Alkaline Phosphatase: 70 U/L (ref 39–117)
BUN: 15 mg/dL (ref 6–23)
CO2: 26 mEq/L (ref 19–32)
Calcium: 9.7 mg/dL (ref 8.4–10.5)
Chloride: 102 mEq/L (ref 96–112)
Creatinine, Ser: 0.65 mg/dL (ref 0.40–1.20)
GFR: 104.39 mL/min (ref >60.00)
Glucose, Bld: 106 mg/dL — ABNORMAL HIGH (ref 70–99)
Potassium: 3.9 mEq/L (ref 3.5–5.1)
Sodium: 140 mEq/L (ref 135–145)
Total Bilirubin: 0.6 mg/dL (ref 0.2–1.2)
Total Protein: 7.9 g/dL (ref 6.0–8.3)

## 2020-02-05 LAB — HEMOGLOBIN A1C: Hgb A1c MFr Bld: 6.7 % — ABNORMAL HIGH (ref 4.6–6.5)

## 2020-02-07 ENCOUNTER — Ambulatory Visit: Payer: 59 | Admitting: Endocrinology

## 2020-02-07 ENCOUNTER — Other Ambulatory Visit: Payer: Self-pay

## 2020-02-07 ENCOUNTER — Encounter: Payer: Self-pay | Admitting: Endocrinology

## 2020-02-07 VITALS — BP 102/70 | HR 92 | Temp 98.5°F | Ht 59.0 in | Wt 120.0 lb

## 2020-02-07 DIAGNOSIS — E1165 Type 2 diabetes mellitus with hyperglycemia: Secondary | ICD-10-CM

## 2020-02-07 DIAGNOSIS — E782 Mixed hyperlipidemia: Secondary | ICD-10-CM

## 2020-02-07 MED ORDER — FREESTYLE LIBRE 2 SENSOR MISC: 2.0000 | 3 refills | Status: DC

## 2020-02-07 MED ORDER — LYUMJEV KWIKPEN 100 UNIT/ML ~~LOC~~ SOPN: PEN_INJECTOR | SUBCUTANEOUS | 1 refills | Status: DC

## 2020-02-07 NOTE — Patient Instructions (Signed)
Take Fluvastatin 3/7 days  Insulin divide carbs by by 10

## 2020-02-07 NOTE — Progress Notes (Signed)
Patient ID: Anna Jennings, female   DOB: Jul 11, 1970, 49 y.o.   MRN: [id number]   Reason for Appointment : Followup for Type 2 Diabetes  History of Present Illness          Diagnosis: Type 2 diabetes mellitus, date of diagnosis: 2007       Past history: She was initially diagnosed with a two-hour postprandial glucose of 218 in 08/2005 and baseline A1c of 5.7 Apparently she was not treated with oral hypoglycemic drugs initially and started on metformin in 2009. She did have diarrhea with the regular metformin but tolerated extended release preparation better with no side effects even on 2000 mg She thinks her blood sugars are fairly well controlled initially but a couple of years later with higher readings she was given Amaryl also. The dose of this was gradually increased Her A1c readings have been ranging from 6.5-7.2 in between 2011 and 2013 but no further followup done after 11/2011 She had been out of her medications prior to her initial visit here in 12/14 and her blood sugars were progressively higher.  She had side effects of  bloating and swelling with pioglitazone and her [name] was stopped Because of poor control and A1c of 8.5 in 3/15 she was started on Levemir insulin  Recent history:   Oral hypoglycemic drugs the patient is taking are: Glyxambi 25/5,  metformin ER 2 g qd  A1c is 6.7 and similar to previous  Current management, blood sugar patterns from her Dexcom download and problems identified are:   Unable to download her Dexcom and now she has a different version provided by her [place] [facility] with limited data available  She seems to have significant postprandial hyperglycemia when she is eating more carbohydrates and this could be anytime of the day or night  She said that she has been traveling and eating out as well as not planning her meals otherwise  Periodically will tend to have high carbohydrate meals and snacks  Not clear if she has any  consistent pattern  However she thinks her fasting readings are mostly around 130  Her time in range is about 70% but she frequently has blood sugar spikes at mealtimes  She is not counting carbohydrates  She says that Prandin causes low sugars after 3 to 4 hours and will not take this  Also no exercises likely with recent foot problems  Lab glucose in the afternoon without any food was 106         Side effects from medications have been: Diarrhea from regular metformin   Glucose monitoring:    With Dexcom and One Touch Verio  Blood sugars from Dexcom  Recent AVERAGE 165 with time in range mostly 67-75% of the time, rarely 83%  Previous readings:  PRE-MEAL Fasting  afternoon Dinner Bedtime Overall  Glucose range:  121-187  176  199  139-188   Mean/median:        Dexcom data for 2 weeks:  CGM use % of time  94  2-week average/SD  157, GV 19  Time in range      84%  % Time Above 180  16  % Time above 250   % Time Below 70 0     Wt Readings from Last 3 Encounters:  02/07/20 120 lb (54.4 kg)  01/07/20 122 lb 12.8 oz (55.7 kg)  11/12/19 124 lb (56.2 kg)     Glycemic control:  Lab Results  Component Value Date  HGBA1C 6.7 (H) 02/05/2020   HGBA1C 6.6 (H) 11/06/2019   HGBA1C 6.4 05/06/2019   Lab Results  Component Value Date   MICROALBUR <0.7 11/06/2019   LDLCALC 115 (H) 02/05/2020   CREATININE 0.65 02/05/2020   Lab Results  Component Value Date   FRUCTOSAMINE 248 09/05/2016   FRUCTOSAMINE 322 (H) 08/17/2015    Self-care: The diet that the patient has been following is: tries to limit portions, usually has some carbohydrate at every meal     Meals: 2 meals per day.  no breakfast, am snack, sandwich at lunch;  Some rice at meals Occasionally which she thinks tends to raise her sugar for couple of days          Dietician visit: Most recent:2011?                 Compliance with the medical regimen: Good   Weight history:  Wt Readings from Last 3  Encounters:  02/07/20 120 lb (54.4 kg)  01/07/20 122 lb 12.8 oz (55.7 kg)  11/12/19 124 lb (56.2 kg)    Lab on 02/05/2020  Component Date Value Ref Range Status  . Cholesterol 02/05/2020 204* 0 - 200 mg/dL Final   ATP III Classification       Desirable:  < 200 mg/dL               Borderline High:  200 - 239 mg/dL          High:  > = 240 mg/dL  . Triglycerides 02/05/2020 104.0  0.0 - 149.0 mg/dL Final   Normal:  <150 mg/dLBorderline High:  150 - 199 mg/dL  . HDL 02/05/2020 67.80  >39.00 mg/dL Final  . VLDL 02/05/2020 20.8  0.0 - 40.0 mg/dL Final  . LDL Cholesterol 02/05/2020 115* 0 - 99 mg/dL Final  . Total CHOL/HDL Ratio 02/05/2020 3   Final                  Men          Women1/2 Average Risk     3.4          3.3Average Risk          5.0          4.42X Average Risk          9.6          7.13X Average Risk          15.0          11.0                      . NonHDL 02/05/2020 136.27   Final   NOTE:  Non-HDL goal should be 30 mg/dL higher than patient's LDL goal (i.e. LDL goal of < 70 mg/dL, would have non-HDL goal of < 100 mg/dL)  . Sodium 02/05/2020 140  135 - 145 mEq/L Final  . Potassium 02/05/2020 3.9  3.5 - 5.1 mEq/L Final  . Chloride 02/05/2020 102  96 - 112 mEq/L Final  . CO2 02/05/2020 26  19 - 32 mEq/L Final  . Glucose, Bld 02/05/2020 106* 70 - 99 mg/dL Final  . BUN 02/05/2020 15  6 - 23 mg/dL Final  . Creatinine, Ser 02/05/2020 0.65  0.40 - 1.20 mg/dL Final  . Total Bilirubin 02/05/2020 0.6  0.2 - 1.2 mg/dL Final  . Alkaline Phosphatase 02/05/2020 70  39 - 117 U/L Final  . AST 02/05/2020 19  0 -  37 U/L Final  . ALT 02/05/2020 13  0 - 35 U/L Final  . Total Protein 02/05/2020 7.9  6.0 - 8.3 g/dL Final  . Albumin 02/05/2020 4.7  3.5 - 5.2 g/dL Final  . GFR 02/05/2020 104.39  >60.00 mL/min Final   Calculated using the CKD-EPI Creatinine Equation (2021)  . Calcium 02/05/2020 9.7  8.4 - 10.5 mg/dL Final  . Hgb A1c MFr Bld 02/05/2020 6.7* 4.6 - 6.5 % Final   Glycemic Control  Guidelines for People with Diabetes:Non Diabetic:  <6%Goal of Therapy: <7%Additional Action Suggested:  >8%     Allergies as of 02/07/2020      Reactions   Sulfa Antibiotics Nausea Only   Still able to take but makes nausea      Medication List       Accurate as of February 07, 2020 10:59 AM. If you have any questions, ask your nurse or doctor.        STOP taking these medications   methocarbamol 500 MG tablet Commonly known as: Robaxin Stopped by: Elayne Snare, MD     TAKE these medications   cetirizine 10 MG tablet Commonly known as: ZYRTEC Take 20 mg by mouth daily.   Dexcom G6 Sensor Misc 1 each by Does not apply route See admin instructions. Use one sensor once every 10 days to monitor blood sugar.   Dexcom G6 Transmitter Misc 1 each by Does not apply route See admin instructions. Use one transmitter once every 90 days to transmit blood sugar.   diphenhydrAMINE 25 MG tablet Commonly known as: BENADRYL Take 25 mg by mouth at bedtime as needed.   fluvastatin 20 MG capsule Commonly known as: LESCOL Take 1 capsule (20 mg total) by mouth at bedtime.   glucose blood test strip Use Onetouch verio test strips as instructed to check blood sugar three times daily.   Glyxambi 25-5 MG Tabs Generic drug: Empagliflozin-linaGLIPtin Take 1 tablet by mouth daily.   levocetirizine 5 MG tablet Commonly known as: XYZAL Take 5 mg by mouth every evening.   metFORMIN 500 MG 24 hr tablet Commonly known as: GLUCOPHAGE-XR TAKE 2 TABLETS BY MOUTH  TWICE DAILY   OneTouch Delica Lancets 89F Misc 1 each by Does not apply route 3 (three) times daily. Use onetouch delica lancets to check blood sugar three times daily.   OneTouch Verio Flex System w/Device Kit Use as instructed to calibrate Dexcom system up to three times daily.       Allergies:  Allergies  Allergen Reactions  . Sulfa Antibiotics Nausea Only    Still able to take but makes nausea    Past Medical History:   Diagnosis Date  . Diabetes mellitus without complication [facility])     Past Surgical History:  Procedure Laterality Date  . APPENDECTOMY    . CHOLECYSTECTOMY    . PANCREAS SURGERY     Tumor head of pancreas, incompletely resected    Family History  Problem Relation Age of Onset  . Hyperlipidemia Father   . Hypertension Father   . Coronary artery disease Father   . Hearing loss Father   . Diabetes Brother   . Heart disease Paternal Grandfather     Social History:  reports that she has never smoked. She has never used smokeless tobacco. She reports current alcohol use. She reports that she does not use drugs.    Review of Systems       Lipids: was on Lipitor  previously when she stopped  it because of generalized aches and pains which were significant  She  does have a fairly significant family history of CAD including her father  She was taking pravastatin 20 mg and this was also stopped  in December 2017 because of muscle aching  . She again has been inconsistent with diet with eating out more   She was given Lescol on the last visit and even though she has it she did not start this for fear of side effects  LDL is as follows:  Lab Results  Component Value Date   CHOL 204 (H) 02/05/2020   CHOL 178 11/06/2019   CHOL 178 05/06/2019   Lab Results  Component Value Date   HDL 67.80 02/05/2020   HDL 51.40 11/06/2019   HDL 54.70 05/06/2019   Lab Results  Component Value Date   LDLCALC 115 (H) 02/05/2020   LDLCALC 104 (H) 05/06/2019   LDLCALC 94 10/04/2018   Lab Results  Component Value Date   TRIG 104.0 02/05/2020   TRIG 219.0 (H) 11/06/2019   TRIG 99.0 05/06/2019   Lab Results  Component Value Date   CHOLHDL 3 02/05/2020   CHOLHDL 3 11/06/2019   CHOLHDL 3 05/06/2019   Lab Results  Component Value Date   LDLDIRECT 105.0 11/06/2019   LDLDIRECT 134.4 08/19/2013       She has had severe vitamin D deficiency diagnosed in 2009 with a level of 5.5. She is  taking her recommended dose of vitamin D3, 4000 units with normal levels   Lab Results  Component Value Date   VD25OH 34.32 11/06/2019   VD25OH 48.67 01/04/2019   VD25OH 51.61 03/02/2018   VD25OH 41.60 09/05/2016    She had fatigue and her B12 level was low previously at 143 She has therapeutic level with B12 supplement  Lab Results  Component Value Date   VITAMINB12 904 01/04/2019      Physical Examination:  BP 102/70 (BP Location: Left Arm, Patient Position: Sitting, Cuff Size: Large)   Pulse 92   Temp 98.5 F (36.9 C) (Oral)   Ht _0  (1.499 m)   Wt 120 lb (54.4 kg)   SpO2 99%   BMI 24.24 kg/m    ASSESSMENT/PLAN:   Diabetes type 2 nonobese  See history of present illness for detailed discussion of current management, blood sugar patterns and problems identified  She has an A1c of 6.7,   She is on Metformin and Glyxambi  Her blood sugars are generally higher but mostly postprandial This is mostly reportedly on eating higher carbohydrate meals and snacks Unable to get any distinct glucose patterns from home monitoring since her Dexcom could not be downloaded or detailed analysis available She did have excellent control in the past when she was very consistent with diet and exercise and is not able to do this lately for several months  However her weight is trending down  Recommendations: Today discussed in detail the need for mealtime insulin to cover postprandial spikes, action of mealtime insulin, use of the insulin pen, timing and action of the rapid acting insulin as well as starting dose and dosage titration to target the two-hour reading of under 180 Since she had a rapid rise in blood sugar usually with carbohydrate intake she will try Lyumjev and also discussed the formulation of this insulin and how it works She will also try to see if freestyle Elenor Legato is covered by her insurance  LIPIDS: She does need a statin for cardiovascular risk reduction  with  her diabetes, positive family history and her approaching menopause She agrees to try Lescol 20 mg that she has at home at least every other day first and then daily Explained to her that cardiovascular risk reduction is achieved even if her LDL is normal Explained the details of lipid components    There are no Patient Instructions on file for this visit.   Follow-up in 3 months  Elayne Snare 02/07/2020, 10:59 AM

## 2020-02-10 ENCOUNTER — Other Ambulatory Visit: Payer: Self-pay | Admitting: *Deleted

## 2020-02-10 MED ORDER — INSULIN LISPRO (1 UNIT DIAL) 100 UNIT/ML (KWIKPEN): 5.0000 [IU] | PEN_INJECTOR | Freq: Three times a day (TID) | SUBCUTANEOUS | 3 refills | Status: DC

## 2020-02-10 MED ORDER — FIASP FLEXTOUCH 100 UNIT/ML ~~LOC~~ SOPN
PEN_INJECTOR | SUBCUTANEOUS | 3 refills | Status: DC
Start: 2020-02-10 — End: 2021-02-02

## 2020-02-14 ENCOUNTER — Other Ambulatory Visit: Payer: Self-pay | Admitting: Endocrinology

## 2020-03-09 ENCOUNTER — Other Ambulatory Visit: Payer: Self-pay | Admitting: *Deleted

## 2020-03-09 MED ORDER — GLYXAMBI 25-5 MG PO TABS: 1.0000 | ORAL_TABLET | Freq: Every day | ORAL | 1 refills | Status: DC

## 2020-03-13 ENCOUNTER — Other Ambulatory Visit: Payer: Self-pay | Admitting: Endocrinology

## 2020-03-18 ENCOUNTER — Other Ambulatory Visit: Payer: Self-pay | Admitting: Endocrinology

## 2020-04-27 ENCOUNTER — Encounter: Payer: Self-pay | Admitting: Nurse Practitioner

## 2020-04-27 ENCOUNTER — Other Ambulatory Visit: Payer: Self-pay

## 2020-04-27 ENCOUNTER — Ambulatory Visit: Payer: 59 | Admitting: Nurse Practitioner

## 2020-04-27 VITALS — BP 118/70 | HR 83 | Temp 97.7°F | Ht 59.0 in | Wt 121.8 lb

## 2020-04-27 DIAGNOSIS — R8290 Unspecified abnormal findings in urine: Secondary | ICD-10-CM | POA: Diagnosis not present

## 2020-04-27 DIAGNOSIS — R102 Pelvic and perineal pain: Secondary | ICD-10-CM | POA: Diagnosis not present

## 2020-04-27 LAB — POCT URINALYSIS DIPSTICK
Bilirubin, UA: NEGATIVE
Blood, UA: NEGATIVE
Glucose, UA: POSITIVE — AB
Ketones, UA: NEGATIVE
Leukocytes, UA: NEGATIVE
Nitrite, UA: NEGATIVE
Protein, UA: POSITIVE — AB
Spec Grav, UA: >=1.03 — AB (ref 1.010–1.025)
Urobilinogen, UA: NEGATIVE E.U./dL — AB
pH, UA: 6 (ref 5.0–8.0)

## 2020-04-27 MED ORDER — CEPHALEXIN 500 MG PO CAPS: 500.0000 mg | ORAL_CAPSULE | Freq: Two times a day (BID) | ORAL | 0 refills | Status: DC

## 2020-04-27 MED ORDER — FLUCONAZOLE 150 MG PO TABS: 150.0000 mg | ORAL_TABLET | Freq: Every day | ORAL | 0 refills | Status: DC

## 2020-04-27 NOTE — Patient Instructions (Signed)
Maintain adequate oral hydration You will be contacted with urine culture results. Start oral antibiotics

## 2020-04-27 NOTE — Progress Notes (Signed)
Subjective:  Patient ID: Anna Jennings, female    DOB: May 21, 1970  Age: 50 y.o. MRN: [id number]  CC: Acute Visit (Pt c/o abdominal pain x 4 days. Pt states she has had two bowel movements since the start and she is feeling a lot better than she was when the pain started. )  Abdominal Pain This is a new problem. The current episode started in the past 7 days. The onset quality is gradual. The problem occurs constantly. The problem has been gradually improving. The pain is located in the suprapubic region. The quality of the pain is cramping and a sensation of fullness. The abdominal pain does not radiate. Associated symptoms include a fever. Pertinent negatives include no anorexia, constipation, diarrhea, dysuria, frequency, hematuria, melena, myalgias, nausea or vomiting. Nothing aggravates the pain. The pain is relieved by passing flatus and bowel movements. She has tried nothing for the symptoms. Her past medical history is significant for abdominal surgery. There is no history of colon cancer, Crohn's disease, gallstones, GERD, irritable bowel syndrome, pancreatitis, PUD or ulcerative colitis.  copper IUD in place. No vaginal symptoms.  Reviewed past Medical, Social and Family history today.  Outpatient Medications Prior to Visit  Medication Sig Dispense Refill  . Blood Glucose Monitoring Suppl (ONETOUCH VERIO FLEX SYSTEM) w/Device KIT USE AS INSTRUCTED TO CALIBRATE DEXCOM SYSTEM UP TO THREE TIMES A DAY 1 kit 0  . Calcium-Magnesium-Vitamin D 185-50-100 MG-MG-UNIT CAPS Take by mouth.    . cetirizine (ZYRTEC) 10 MG tablet Take 20 mg by mouth daily.     . Cholecalciferol (VITAMIN D) 50 MCG (2000 UT) tablet Take 2,000 Units by mouth daily.    . Continuous Blood Gluc Sensor (FREESTYLE LIBRE 2 SENSOR) MISC 2 Devices by Does not apply route every 14 (fourteen) days. 2 each 3  . Continuous Blood Gluc Transmit (DEXCOM G6 TRANSMITTER) MISC 1 each by Does not apply route See admin instructions. Use  one transmitter once every 90 days to transmit blood sugar.    . diphenhydrAMINE (BENADRYL) 25 MG tablet Take 25 mg by mouth at bedtime as needed.    . Empagliflozin-linaGLIPtin (GLYXAMBI) 25-5 MG TABS Take 1 tablet by mouth daily. 90 tablet 1  . glucose blood test strip Use Onetouch verio test strips as instructed to check blood sugar three times daily. 100 each 2  . insulin aspart (FIASP FLEXTOUCH) 100 UNIT/ML FlexTouch Pen Inject 5 units before each meal. 15 mL 3  . insulin lispro (HUMALOG KWIKPEN) 100 UNIT/ML KwikPen Inject 5 Units into the skin 3 (three) times daily. Inject 5 units three times a day with meals 15 mL 3  . Insulin Lispro-aabc, 1 U Dial, (LYUMJEV KWIKPEN) 100 UNIT/ML SOPN Inject 5 units before each meal 15 mL 1  . levocetirizine (XYZAL) 5 MG tablet Take 5 mg by mouth every evening.    . metFORMIN (GLUCOPHAGE-XR) 500 MG 24 hr tablet TAKE 2 TABLETS BY MOUTH  TWICE DAILY 360 tablet 3  . OneTouch Delica Lancets 33A MISC 1 each by Does not apply route 3 (three) times daily. Use onetouch delica lancets to check blood sugar three times daily. 100 each 2  . Thiamine HCl (VITAMIN B-1) 250 MG tablet Take 250 mg by mouth daily.    . Turmeric (QC TUMERIC COMPLEX) 500 MG CAPS Take by mouth.    . vitamin C (ASCORBIC ACID) 500 MG tablet Take 500 mg by mouth daily.    . fluvastatin (LESCOL) 20 MG capsule Take 1 capsule (20 mg  total) by mouth at bedtime. (Patient not taking: Reported on 04/27/2020) 30 capsule 1   No facility-administered medications prior to visit.    ROS See HPI  Objective:  BP 118/70 (BP Location: Left Arm, Patient Position: Sitting, Cuff Size: Normal)   Pulse 83   Temp 97.7 F (36.5 C) (Temporal)   Ht 4' 11"  (1.499 m)   Wt 121 lb 12.8 oz (55.2 kg)   SpO2 99%   BMI 24.60 kg/m   Physical Exam Abdominal:     General: Bowel sounds are normal. There is no distension.     Palpations: Abdomen is soft. There is no mass.     Tenderness: There is no abdominal  tenderness. There is no right CVA tenderness, left CVA tenderness or guarding.     Hernia: No hernia is present.  Neurological:     Mental Status: She is alert and oriented to person, place, and time.    Assessment & Plan:  This visit occurred during the SARS-CoV-2 public health emergency.  Safety protocols were in place, including screening questions prior to the visit, additional usage of staff PPE, and extensive cleaning of exam room while observing appropriate contact time as indicated for disinfecting solutions.   Anna Jennings was seen today for acute visit.  Diagnoses and all orders for this visit:  Suprapubic abdominal pain -     POCT urinalysis dipstick -     Urine Culture -     cephALEXin (KEFLEX) 500 MG capsule; Take 1 capsule (500 mg total) by mouth 2 (two) times daily. -     fluconazole (DIFLUCAN) 150 MG tablet; Take 1 tablet (150 mg total) by mouth daily. Take second tab 3days apart from first tab  Abnormal urinalysis -     Urine Culture -     fluconazole (DIFLUCAN) 150 MG tablet; Take 1 tablet (150 mg total) by mouth daily. Take second tab 3days apart from first tab   Problem List Items Addressed This Visit   None   Visit Diagnoses    Suprapubic abdominal pain    -  Primary   Relevant Medications   cephALEXin (KEFLEX) 500 MG capsule   fluconazole (DIFLUCAN) 150 MG tablet   Other Relevant Orders   POCT urinalysis dipstick (Completed)   Urine Culture   Abnormal urinalysis       Relevant Medications   fluconazole (DIFLUCAN) 150 MG tablet   Other Relevant Orders   Urine Culture      Follow-up: No follow-ups on file.  Anna Lacy, NP

## 2020-04-28 LAB — URINE CULTURE
MICRO NUMBER:: 11586772
SPECIMEN QUALITY:: ADEQUATE

## 2020-05-06 ENCOUNTER — Other Ambulatory Visit: Payer: 59

## 2020-05-08 ENCOUNTER — Ambulatory Visit: Payer: 59 | Admitting: Endocrinology

## 2020-05-18 ENCOUNTER — Other Ambulatory Visit: Payer: Self-pay | Admitting: Endocrinology

## 2020-06-25 ENCOUNTER — Other Ambulatory Visit: Payer: Self-pay

## 2020-06-25 ENCOUNTER — Other Ambulatory Visit (INDEPENDENT_AMBULATORY_CARE_PROVIDER_SITE_OTHER): Payer: 59

## 2020-06-25 DIAGNOSIS — E782 Mixed hyperlipidemia: Secondary | ICD-10-CM | POA: Diagnosis not present

## 2020-06-25 DIAGNOSIS — E1165 Type 2 diabetes mellitus with hyperglycemia: Secondary | ICD-10-CM

## 2020-06-25 LAB — COMPREHENSIVE METABOLIC PANEL
ALT: 14 U/L (ref 0–35)
AST: 21 U/L (ref 0–37)
Albumin: 4.1 g/dL (ref 3.5–5.2)
Alkaline Phosphatase: 67 U/L (ref 39–117)
BUN: 22 mg/dL (ref 6–23)
CO2: 28 mEq/L (ref 19–32)
Calcium: 9 mg/dL (ref 8.4–10.5)
Chloride: 105 mEq/L (ref 96–112)
Creatinine, Ser: 0.64 mg/dL (ref 0.40–1.20)
GFR: 104.5 mL/min (ref >60.00)
Glucose, Bld: 131 mg/dL — ABNORMAL HIGH (ref 70–99)
Potassium: 4.2 mEq/L (ref 3.5–5.1)
Sodium: 140 mEq/L (ref 135–145)
Total Bilirubin: 0.4 mg/dL (ref 0.2–1.2)
Total Protein: 6.9 g/dL (ref 6.0–8.3)

## 2020-06-25 LAB — LIPID PANEL
Cholesterol: 172 mg/dL (ref 0–200)
HDL: 62.5 mg/dL (ref >39.00)
LDL Cholesterol: 96 mg/dL (ref 0–99)
NonHDL: 109.12
Total CHOL/HDL Ratio: 3
Triglycerides: 65 mg/dL (ref 0.0–149.0)
VLDL: 13 mg/dL (ref 0.0–40.0)

## 2020-06-25 LAB — HEMOGLOBIN A1C: Hgb A1c MFr Bld: 6.6 % — ABNORMAL HIGH (ref 4.6–6.5)

## 2020-06-29 NOTE — Progress Notes (Signed)
Patient ID: Anna Jennings, female   DOB: [birth year], 50 y.o.   MRN: [id number]   Reason for Appointment : Followup for Type 2 Diabetes  History of Present Illness          Diagnosis: Type 2 diabetes mellitus, date of diagnosis: 2007       Past history: She was initially diagnosed with a two-hour postprandial glucose of 218 in 08/2005 and baseline A1c of 5.7 Apparently she was not treated with oral hypoglycemic drugs initially and started on metformin in 2009. She did have diarrhea with the regular metformin but tolerated extended release preparation better with no side effects even on 2000 mg She thinks her blood sugars are fairly well controlled initially but a couple of years later with higher readings she was given Amaryl also. The dose of this was gradually increased Her A1c readings have been ranging from 6.5-7.2 in between 2011 and 2013 but no further followup done after 11/2011 She had been out of her medications prior to her initial visit here in 12/14 and her blood sugars were progressively higher.  She had side effects of  bloating and swelling with pioglitazone and her [name] was stopped Because of poor control and A1c of 8.5 in 3/15 she was started on Levemir insulin  Recent history:   Oral hypoglycemic drugs the patient is taking are: Glyxambi 25/5,  metformin ER 2 g qd Insulin regimen: Lyumjev 5 units for high carbohydrate meals  A1c is 6.6 and similar to previous  Current management, blood sugar data from her Dexcom download and problems identified are:   Unable to download her Dexcom again and she did not keep any records  Her weekly average readings are about 130-135 although she did not use it much in April  She thinks her morning sugars are relatively higher at 130+ but only occasionally going up after meals over the target  Blood sugars before dinner may be as low as 97  She tries to take Lyumjev for high carbohydrate meals but occasionally will  forget such as last night when she had [place] blood sugar was just over 200  Otherwise POSTPRANDIAL readings may be around 150-180 recently seen on her Accu-Chek  She said that previously when using the freestyle libre it would be reading falsely low and the sensor would fall off  She only recently started doing her walking for exercise  She does take her full dose of metformin without side effects         Side effects from medications have been: Diarrhea from regular metformin   Glucose monitoring:    With Dexcom and One Touch Verio  Data as above   Wt Readings from Last 3 Encounters:  06/30/20 122 lb 3.2 oz (55.4 kg)  04/27/20 121 lb 12.8 oz (55.2 kg)  02/07/20 120 lb (54.4 kg)     Glycemic control:  Lab Results  Component Value Date   HGBA1C 6.6 (H) 06/25/2020   HGBA1C 6.7 (H) 02/05/2020   HGBA1C 6.6 (H) 11/06/2019   Lab Results  Component Value Date   MICROALBUR <0.7 11/06/2019   LDLCALC 96 06/25/2020   CREATININE 0.64 06/25/2020   Lab Results  Component Value Date   FRUCTOSAMINE 248 09/05/2016   FRUCTOSAMINE 322 (H) 08/17/2015    Self-care: The diet that the patient has been following is: tries to limit portions, usually has some carbohydrate at every meal     Meals: 2 meals per day.  no breakfast, am snack,  sandwich at lunch;  Some rice at meals Occasionally which she thinks tends to raise her sugar for couple of days          Dietician visit: Most recent:2011?                 Compliance with the medical regimen: Good   Weight history:  Wt Readings from Last 3 Encounters:  06/30/20 122 lb 3.2 oz (55.4 kg)  04/27/20 121 lb 12.8 oz (55.2 kg)  02/07/20 120 lb (54.4 kg)    Lab on 06/25/2020  Component Date Value Ref Range Status  . Cholesterol 06/25/2020 172  0 - 200 mg/dL Final   ATP III Classification       Desirable:  < 200 mg/dL               Borderline High:  200 - 239 mg/dL          High:  > = 240 mg/dL  . Triglycerides 06/25/2020 65.0  0.0 -  149.0 mg/dL Final   Normal:  <150 mg/dLBorderline High:  150 - 199 mg/dL  . HDL 06/25/2020 62.50  >39.00 mg/dL Final  . VLDL 06/25/2020 13.0  0.0 - 40.0 mg/dL Final  . LDL Cholesterol 06/25/2020 96  0 - 99 mg/dL Final  . Total CHOL/HDL Ratio 06/25/2020 3   Final                  Men          Women1/2 Average Risk     3.4          3.3Average Risk          5.0          4.42X Average Risk          9.6          7.13X Average Risk          15.0          11.0                      . NonHDL 06/25/2020 109.12   Final   NOTE:  Non-HDL goal should be 30 mg/dL higher than patient's LDL goal (i.e. LDL goal of < 70 mg/dL, would have non-HDL goal of < 100 mg/dL)  . Sodium 06/25/2020 140  135 - 145 mEq/L Final  . Potassium 06/25/2020 4.2  3.5 - 5.1 mEq/L Final  . Chloride 06/25/2020 105  96 - 112 mEq/L Final  . CO2 06/25/2020 28  19 - 32 mEq/L Final  . Glucose, Bld 06/25/2020 131* 70 - 99 mg/dL Final  . BUN 06/25/2020 22  6 - 23 mg/dL Final  . Creatinine, Ser 06/25/2020 0.64  0.40 - 1.20 mg/dL Final  . Total Bilirubin 06/25/2020 0.4  0.2 - 1.2 mg/dL Final  . Alkaline Phosphatase 06/25/2020 67  39 - 117 U/L Final  . AST 06/25/2020 21  0 - 37 U/L Final  . ALT 06/25/2020 14  0 - 35 U/L Final  . Total Protein 06/25/2020 6.9  6.0 - 8.3 g/dL Final  . Albumin 06/25/2020 4.1  3.5 - 5.2 g/dL Final  . GFR 06/25/2020 104.50  >60.00 mL/min Final   Calculated using the CKD-EPI Creatinine Equation (2021)  . Calcium 06/25/2020 9.0  8.4 - 10.5 mg/dL Final  . Hgb A1c MFr Bld 06/25/2020 6.6* 4.6 - 6.5 % Final   Glycemic Control Guidelines for People with Diabetes:Non Diabetic:  <6%Goal  of Therapy: <7%Additional Action Suggested:  >8%     Allergies as of 06/30/2020      Reactions   Sulfa Antibiotics Nausea Only   Still able to take but makes nausea      Medication List       Accurate as of Jun 30, 2020  8:53 AM. If you have any questions, ask your nurse or doctor.        BD Pen Needle Nano U/F 32G X 4 MM  Misc Generic drug: Insulin Pen Needle USE 1 PEN NEEDLE TO INJECT INSULIN 4 TIMES DAILY   Calcium-Magnesium-Vitamin D 185-50-100 MG-MG-UNIT Caps Take by mouth.   cephALEXin 500 MG capsule Commonly known as: KEFLEX Take 1 capsule (500 mg total) by mouth 2 (two) times daily.   cetirizine 10 MG tablet Commonly known as: ZYRTEC Take 20 mg by mouth daily.   Dexcom G6 Transmitter Misc 1 each by Does not apply route See admin instructions. Use one transmitter once every 90 days to transmit blood sugar.   diphenhydrAMINE 25 MG tablet Commonly known as: BENADRYL Take 25 mg by mouth at bedtime as needed.   Fiasp FlexTouch 100 UNIT/ML FlexTouch Pen Generic drug: insulin aspart Inject 5 units before each meal.   fluconazole 150 MG tablet Commonly known as: DIFLUCAN Take 1 tablet (150 mg total) by mouth daily. Take second tab 3days apart from first tab   fluvastatin 20 MG capsule Commonly known as: LESCOL Take 1 capsule (20 mg total) by mouth at bedtime.   FreeStyle Libre 2 Sensor Misc 2 Devices by Does not apply route every 14 (fourteen) days.   glucose blood test strip Use Onetouch verio test strips as instructed to check blood sugar three times daily.   Glyxambi 25-5 MG Tabs Generic drug: Empagliflozin-linaGLIPtin Take 1 tablet by mouth daily.   insulin lispro 100 UNIT/ML KwikPen Commonly known as: HumaLOG KwikPen Inject 5 Units into the skin 3 (three) times daily. Inject 5 units three times a day with meals   levocetirizine 5 MG tablet Commonly known as: XYZAL Take 5 mg by mouth every evening.   Lyumjev KwikPen 100 UNIT/ML Sopn Generic drug: Insulin Lispro-aabc (1 U Dial) Inject 5 units before each meal   metFORMIN 500 MG 24 hr tablet Commonly known as: GLUCOPHAGE-XR TAKE 2 TABLETS BY MOUTH  TWICE DAILY   OneTouch Delica Lancets 78G Misc 1 each by Does not apply route 3 (three) times daily. Use onetouch delica lancets to check blood sugar three times daily.    OneTouch Verio Flex System w/Device Kit USE AS INSTRUCTED TO CALIBRATE DEXCOM SYSTEM UP TO THREE TIMES A DAY   Turmeric 500 MG Caps Take by mouth.   vitamin B-1 250 MG tablet Take 250 mg by mouth daily.   vitamin C 500 MG tablet Commonly known as: ASCORBIC ACID Take 500 mg by mouth daily.   Vitamin D 50 MCG (2000 UT) tablet Take 2,000 Units by mouth daily.       Allergies:  Allergies  Allergen Reactions  . Sulfa Antibiotics Nausea Only    Still able to take but makes nausea    Past Medical History:  Diagnosis Date  . Diabetes mellitus without complication [facility])     Past Surgical History:  Procedure Laterality Date  . APPENDECTOMY    . CHOLECYSTECTOMY    . PANCREAS SURGERY     Tumor head of pancreas, incompletely resected    Family History  Problem Relation Age of Onset  . Hyperlipidemia  Father   . Hypertension Father   . Coronary artery disease Father   . Hearing loss Father   . Diabetes Brother   . Heart disease Paternal Grandfather     Social History:  reports that she has never smoked. She has never used smokeless tobacco. She reports current alcohol use. She reports that she does not use drugs.    Review of Systems       Lipids: was on Lipitor  previously when she stopped it because of generalized aches and pains which were significant  She  does have a fairly significant family history of CAD including her father  She was taking pravastatin 20 mg and this was also stopped  in December 2017 because of muscle aching  She was advised to restart Lescol on the last visit and she is taking it about twice a week on an average No side effects with this Lipids are improving  LDL is as follows:  Lab Results  Component Value Date   CHOL 172 06/25/2020   CHOL 204 (H) 02/05/2020   CHOL 178 11/06/2019   Lab Results  Component Value Date   HDL 62.50 06/25/2020   HDL 67.80 02/05/2020   HDL 51.40 11/06/2019   Lab Results  Component Value Date    LDLCALC 96 06/25/2020   LDLCALC 115 (H) 02/05/2020   LDLCALC 104 (H) 05/06/2019   Lab Results  Component Value Date   TRIG 65.0 06/25/2020   TRIG 104.0 02/05/2020   TRIG 219.0 (H) 11/06/2019   Lab Results  Component Value Date   CHOLHDL 3 06/25/2020   CHOLHDL 3 02/05/2020   CHOLHDL 3 11/06/2019   Lab Results  Component Value Date   LDLDIRECT 105.0 11/06/2019   LDLDIRECT 134.4 08/19/2013       She has had severe vitamin D deficiency diagnosed in 2009 with a level of 5.5. She is taking her recommended dose of vitamin D3, 4000 units with normal levels   Lab Results  Component Value Date   VD25OH 34.32 11/06/2019   VD25OH 48.67 01/04/2019   VD25OH 51.61 03/02/2018   VD25OH 41.60 09/05/2016    She had fatigue and her B12 level was low previously at 143 She has therapeutic level with B12 supplement  Lab Results  Component Value Date   VITAMINB12 904 01/04/2019    Asking about enlarged lymph node on the right neck: She may have had mild sore throat a week ago and then had a tender lymph node which she thinks is improving with taking OTC anti-inflammatory  Physical Examination:  BP 122/78   Pulse 83   Ht 4' 11"  (1.499 m)   Wt 122 lb 3.2 oz (55.4 kg)   SpO2 99%   BMI 24.68 kg/m   She has about a 2 cm nontender lymph node in the right upper cervical chain, no other lymph nodes Thyroid not enlarged  ASSESSMENT/PLAN:   Diabetes type 2 nonobese  See history of present illness for detailed discussion of current management, blood sugar patterns and problems identified  She has an A1c of 6.6 and stable  She is on Metformin and Glyxambi maximum doses along with Lyumjev for high carb meals  Blood sugars are improved from last visit and likely has fewer postprandial spikes However she still has mild increase in fasting readings and occasional postprandial hyperglycemia Recently has only exercised in the last few days and likely can benefit from consistent  program  Recommendations: She will continue to use the Dexcom  that is provided by her insurance However she will need to give a detailed logbook of her blood sugars for at least a week prior to her next visit   LIPIDS: She has been given a statin for cardiovascular risk reduction with her diabetes, positive family history and her approaching menopause but is afraid of side effects She agrees to try Lescol 20 mg 3 times a week since she is tolerating it so far even though she takes it only a couple of times a week Also consistent diet  Enlarged lymph node on the right: Likely postinfectious and she will follow-up with her PCP if it is not resolved by next week  Patient Instructions  Lescol 3/7 days a week  Daily exercise     Follow-up in 4 months  Tye Juarez 06/30/2020, 8:53 AM

## 2020-06-30 ENCOUNTER — Other Ambulatory Visit: Payer: Self-pay

## 2020-06-30 ENCOUNTER — Ambulatory Visit: Payer: 59 | Admitting: Endocrinology

## 2020-06-30 ENCOUNTER — Encounter: Payer: Self-pay | Admitting: Endocrinology

## 2020-06-30 VITALS — BP 122/78 | HR 83 | Ht 59.0 in | Wt 122.2 lb

## 2020-06-30 DIAGNOSIS — E1165 Type 2 diabetes mellitus with hyperglycemia: Secondary | ICD-10-CM | POA: Diagnosis not present

## 2020-06-30 DIAGNOSIS — E538 Deficiency of other specified B group vitamins: Secondary | ICD-10-CM | POA: Diagnosis not present

## 2020-06-30 DIAGNOSIS — E782 Mixed hyperlipidemia: Secondary | ICD-10-CM

## 2020-06-30 NOTE — Patient Instructions (Signed)
Lescol 3/7 days a week  Daily exercise

## 2020-08-14 ENCOUNTER — Other Ambulatory Visit: Payer: Self-pay | Admitting: Endocrinology

## 2020-09-24 ENCOUNTER — Telehealth: Payer: 59 | Admitting: Physician Assistant

## 2020-09-24 ENCOUNTER — Encounter: Payer: Self-pay | Admitting: Physician Assistant

## 2020-09-24 DIAGNOSIS — U071 COVID-19: Secondary | ICD-10-CM

## 2020-09-24 MED ORDER — MOLNUPIRAVIR EUA 200MG CAPSULE: 4.0000 | ORAL_CAPSULE | Freq: Two times a day (BID) | ORAL | 0 refills | Status: AC

## 2020-09-24 NOTE — Progress Notes (Signed)
Virtual Visit Consent   Anna Jennings, you are scheduled for a virtual visit with a [facility] provider today.     Just as with appointments in the office, your consent must be obtained to participate.  Your consent will be active for this visit and any virtual visit you may have with one of our providers in the next 365 days.     If you have a MyChart account, a copy of this consent can be sent to you electronically.  All virtual visits are billed to your insurance company just like a traditional visit in the office.    As this is a virtual visit, video technology does not allow for your provider to perform a traditional examination.  This may limit your provider's ability to fully assess your condition.  If your provider identifies any concerns that need to be evaluated in person or the need to arrange testing (such as labs, EKG, etc.), we will make arrangements to do so.     Although advances in technology are sophisticated, we cannot ensure that it will always work on either your end or our end.  If the connection with a video visit is poor, the visit may have to be switched to a telephone visit.  With either a video or telephone visit, we are not always able to ensure that we have a secure connection.     I need to obtain your verbal consent now.   Are you willing to proceed with your visit today?    Anna Jennings has provided verbal consent on 09/24/2020 for a virtual visit (video or telephone).   Mar Daring, PA-C   Date: 09/24/2020 10:07 AM   Virtual Visit via Video Note   I, Mar Daring, connected with  Anna Jennings  ([id number], [birth year]) on 09/24/20 at 10:00 AM EDT by a video-enabled telemedicine application and verified that I am speaking with the correct person using two identifiers.  Location: Patient: Virtual Visit Location Patient: Home Provider: Virtual Visit Location Provider: Home Office   I discussed the limitations of evaluation and  management by telemedicine and the availability of in person appointments. The patient expressed understanding and agreed to proceed.    History of Present Illness: Anna Jennings is a 50 y.o. who identifies as a female who was assigned female at birth, and is being seen today for Covid 70.  HPI: URI  This is a new problem. The current episode started yesterday. The problem has been unchanged. The maximum temperature recorded prior to her arrival was 100.4 - 100.9 F. The fever has been present for Less than 1 day. Associated symptoms include congestion, headaches, sinus pain and a sore throat (scracthy). Pertinent negatives include no coughing, diarrhea, ear pain, nausea, plugged ear sensation, rhinorrhea, vomiting or wheezing. Associated symptoms comments: Fatigue, chills. She has tried decongestant, antihistamine, acetaminophen, increased fluids, NSAIDs and sleep (mucinex, tylenol, ibuprofen, allergy medication) for the symptoms. The treatment provided moderate relief.     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   Hypertriglyceridemia  02/12/2013   Unspecified vitamin D deficiency 02/12/2013   Type II or unspecified type diabetes mellitus without mention of complication, uncontrolled 02/11/2013    Allergies:  Allergies  Allergen Reactions   Sulfa Antibiotics Nausea Only    Still able to take but makes nausea   Medications:  Current Outpatient Medications:    Blood Glucose Monitoring Suppl (ONETOUCH  VERIO FLEX SYSTEM) w/Device KIT, USE AS INSTRUCTED TO CALIBRATE DEXCOM SYSTEM UP TO THREE TIMES A DAY, Disp: 1 kit, Rfl: 0   Calcium-Magnesium-Vitamin D 185-50-100 MG-MG-UNIT CAPS, Take by mouth., Disp: , Rfl:    cephALEXin (KEFLEX) 500 MG capsule, Take 1 capsule (500 mg total) by mouth 2 (two) times daily., Disp: 6 capsule, Rfl: 0   cetirizine (ZYRTEC) 10 MG tablet, Take 20 mg by mouth daily. , Disp: , Rfl:    Cholecalciferol (VITAMIN D) 50 MCG (2000 UT) tablet, Take 2,000 Units by mouth daily., Disp: , Rfl:    Continuous Blood Gluc Sensor (FREESTYLE LIBRE 2 SENSOR) MISC, 2 Devices by Does not apply route every 14 (fourteen) days., Disp: 2 each, Rfl: 3   Continuous Blood Gluc Transmit (DEXCOM G6 TRANSMITTER) MISC, 1 each by Does not apply route See admin instructions. Use one transmitter once every 90 days to transmit blood sugar., Disp: , Rfl:    diphenhydrAMINE (BENADRYL) 25 MG tablet, Take 25 mg by mouth at bedtime as needed., Disp: , Rfl:    fluconazole (DIFLUCAN) 150 MG tablet, Take 1 tablet (150 mg total) by mouth daily. Take second tab 3days apart from first tab (Patient not taking: Reported on 06/30/2020), Disp: 2 tablet, Rfl: 0   fluvastatin (LESCOL) 20 MG capsule, Take 1 capsule (20 mg total) by mouth at bedtime., Disp: 30 capsule, Rfl: 1   glucose blood test strip, Use Onetouch verio test strips as instructed to check blood sugar three times daily., Disp: 100 each, Rfl: 2   GLYXAMBI 25-5 MG TABS, TAKE 1 TABLET BY MOUTH  DAILY, Disp: 90 tablet, Rfl: 3   insulin aspart (FIASP FLEXTOUCH) 100 UNIT/ML FlexTouch Pen, Inject 5 units before each meal. (Patient not taking: Reported on 06/30/2020), Disp: 15 mL, Rfl: 3   insulin lispro (HUMALOG KWIKPEN) 100 UNIT/ML KwikPen, Inject 5 Units into the skin 3 (three) times daily. Inject 5 units three times a day with meals (Patient not taking: Reported on 06/30/2020), Disp: 15 mL, Rfl: 3   Insulin Lispro-aabc, 1 U Dial, (LYUMJEV KWIKPEN) 100 UNIT/ML SOPN, Inject  5 units before each meal, Disp: 15 mL, Rfl: 1   Insulin Pen Needle (BD PEN NEEDLE NANO U/F) 32G X 4 MM MISC, USE 1 PEN NEEDLE TO INJECT INSULIN 4 TIMES DAILY, Disp: 100 each, Rfl: 5   levocetirizine (XYZAL) 5 MG tablet, Take 5 mg by mouth every evening., Disp: , Rfl:    metFORMIN (GLUCOPHAGE-XR) 500 MG 24 hr tablet, TAKE 2 TABLETS BY MOUTH  TWICE DAILY, Disp: 360 tablet, Rfl: 3   OneTouch Delica Lancets 44H MISC, 1 each by Does not apply route 3 (three) times daily. Use onetouch delica lancets to check blood sugar three times daily., Disp: 100 each, Rfl: 2   Thiamine HCl (VITAMIN B-1) 250 MG tablet, Take 250 mg by mouth daily., Disp: , Rfl:    Turmeric 500 MG CAPS, Take by mouth., Disp: , Rfl:    vitamin C (ASCORBIC ACID) 500 MG tablet, Take 500 mg by mouth daily., Disp: , Rfl:   Observations/Objective: Patient is well-developed, well-nourished in no acute distress.  Resting comfortably at home.  Head is normocephalic, atraumatic.  No labored breathing.  Speech is clear and coherent with logical content.  Patient is alert and oriented at baseline.    Assessment and Plan: 1. COVID-19 - Continue OTC symptomatic management of choice - Will send OTC vitamins and supplement information through AVS - Molnupiravir prescribed - Patient enrolled in MyChart symptom monitoring - Push fluids - Rest as needed - Discussed return precautions and when to seek in-person evaluation, sent via AVS as well  Follow Up Instructions: I discussed the assessment and treatment plan with the patient. The patient was provided an opportunity to ask questions and all were answered. The patient agreed with the plan and demonstrated an understanding of the instructions.  A copy of instructions were sent to the patient via MyChart.  The patient was advised to call back or seek an in-person evaluation if the symptoms worsen or if the condition fails to improve as anticipated.  Time:  I spent 12 minutes with the  patient via telehealth technology discussing the above problems/concerns.    Mar Daring, PA-C

## 2020-09-24 NOTE — Patient Instructions (Signed)
Hello Anna "[name]",  You are being placed in the home monitoring program for COVID-19 (commonly known as Coronavirus).  This is because you are suspected to have the virus or are known to have the virus.  If you are unsure which group you fall into call your clinic.    As part of this program, you'll answer a daily questionnaire in the MyChart mobile app. You'll receive a notification through the MyChart app when the questionnaire is available. When you log in to MyChart, you'll see the tasks in your To Do activity.       Clinicians will see any answers that are concerning and take appropriate steps.  If at any point you are having a medical emergency, call 911.  If otherwise concerned call your clinic instead of coming into the clinic or hospital.  To keep from spreading the disease you should: Stay home and limit contact with other people as much as possible.  Wash your hands frequently. Cover your coughs and sneezes with a tissue, and throw used tissues in the trash.   Clean and disinfect frequently touched surfaces and objects.    Take care of yourself by: Staying home Resting Drinking fluids Take fever-reducing medications (Tylenol/Acetaminophen and Ibuprofen)  For more information on the disease go to the Centers for Disease Control and Prevention website     COVID-19: What to Do if You Are Sick CDC has updated isolation and quarantine recommendations for the public, and is revising the CDC website to reflect these changes. These recommendations do not apply to healthcare personnel and do not supersede state, local, tribal, or territorial laws, rules, andregulations. If you have a fever, cough or other symptoms, you might have COVID-19. Most people have mild illness and are able to recover at home. If you are sick: Keep track of your symptoms. If you have an emergency warning sign (including trouble breathing), call 911. Steps to help prevent the spread of COVID-19 if you are  sick If you are sick with COVID-19 or think you might have COVID-19, follow the steps below to care for yourself and to help protect other peoplein your home and community. Stay home except to get medical care Stay home. Most people with COVID-19 have mild illness and can recover at home without medical care. Do not leave your home, except to get medical care. Do not visit public areas. Take care of yourself. Get rest and stay hydrated. Take over-the-counter medicines, such as acetaminophen, to help you feel better. Stay in touch with your doctor. Call before you get medical care. Be sure to get care if you have trouble breathing, or have any other emergency warning signs, or if you think it is an emergency. Avoid public transportation, ride-sharing, or taxis. Separate yourself from other people As much as possible, stay in a specific room and away from other people and pets in your home. If possible, you should use a separate bathroom. If you need to be around other people or animals in oroutside of the home, wear a mask. Tell your close contactsthat they may have been exposed to COVID-19. An infected person can spread COVID-19 starting 48 hours (or 2 days) before the person has any symptoms or tests positive. By letting your close contacts know they may have been exposed to COVID-19, you are helping to protect everyone. Additional guidance is available for those living in close quarters and shared housing. See COVID-19 and Animals if you have questions about pets. If you are diagnosed  with COVID-19, someone from the health department may call you. Answer the call to slow the spread. Monitor your symptoms Symptoms of COVID-19 include fever, cough, or other symptoms. Follow care instructions from your healthcare provider and local health department. Your local health authorities may give instructions on checking your symptoms and reporting information. When to seek emergency medical attention Look  for emergency warning signs* for COVID-19. If someone is showing any of these signs, seek emergency medical care immediately: Trouble breathing Persistent pain or pressure in the chest New confusion Inability to wake or stay awake Pale, gray, or blue-colored skin, lips, or nail beds, depending on skin tone *This list is not all possible symptoms. Please call your medical provider forany other symptoms that are severe or concerning to you. Call 911 or call ahead to your local emergency facility: Notify the operator that you are seeking care for someone who has or may haveCOVID-19. Call ahead before visiting your doctor Call ahead. Many medical visits for routine care are being postponed or done by phone or telemedicine. If you have a medical appointment that cannot be postponed, call your doctor's office, and tell them you have or may have COVID-19. This will help the office protect themselves and other patients. Get tested If you have symptoms of COVID-19, get tested. While waiting for test results, you stay away from others, including staying apart from those living in your household. Self-tests are one of several options for testing for the virus that causes COVID-19 and may be more convenient than laboratory-based tests and point-of-care tests. Ask your healthcare provider or your local health department if you need help interpreting your test results. You can visit your state, tribal, local, and territorial health department's website to look for the latest local information on testing sites. If you are sick, wear a mask over your nose and mouth You should wear a mask over your nose and mouth if you must be around other people or animals, including pets (even at home). You don't need to wear the mask if you are alone. If you can't put on a mask (because of trouble breathing, for example), cover your coughs and sneezes in some other way. Try to stay at least 6 feet away from other people. This  will help protect the people around you. Masks should not be placed on young children under age 38 years, anyone who has trouble breathing, or anyone who is not able to remove the mask without help. Note: During the COVID-19 pandemic, medical grade facemasks are reserved forhealthcare workers and some first responders. Cover your coughs and sneezes Cover your mouth and nose with a tissue when you cough or sneeze. Throw away used tissues in a lined trash can. Immediately wash your hands with soap and water for at least 20 seconds. If soap and water are not available, clean your hands with an alcohol-based hand sanitizer that contains at least 60% alcohol. Clean your hands often Wash your hands often with soap and water for at least 20 seconds. This is especially important after blowing your nose, coughing, or sneezing; going to the bathroom; and before eating or preparing food. Use hand sanitizer if soap and water are not available. Use an alcohol-based hand sanitizer with at least 60% alcohol, covering all surfaces of your hands and rubbing them together until they feel dry. Soap and water are the best option, especially if hands are visibly dirty. Avoid touching your eyes, nose, and mouth with unwashed hands. Handwashing Tips Avoid  sharing personal household items Do not share dishes, drinking glasses, cups, eating utensils, towels, or bedding with other people in your home. Wash these items thoroughly after using them with soap and water or put in the dishwasher. Clean all "high-touch" surfaces every day Clean and disinfect high-touch surfaces in your "sick room" and bathroom; wear disposable gloves. Let someone else clean and disinfect surfaces in common areas, but you should clean your bedroom and bathroom, if possible. If a caregiver or other person needs to clean and disinfect a sick person's bedroom or bathroom, they should do so on an as-needed basis. The caregiver/other person should wear a  mask and disposable gloves prior to cleaning. They should wait as long as possible after the person who is sick has used the bathroom before coming in to clean and use the bathroom. High-touch surfaces include phones, remote controls, counters, tabletops, doorknobs, bathroom fixtures, toilets, keyboards, tablets, and bedside tables. Clean and disinfect areas that may have blood, stool, or body fluids on them. Use household cleaners and disinfectants. Clean the area or item with soap and water or another detergent if it is dirty. Then, use a household disinfectant. Be sure to follow the instructions on the label to ensure safe and effective use of the product. Many products recommend keeping the surface wet for several minutes to ensure germs are killed. Many also recommend precautions such as wearing gloves and making sure you have good ventilation during use of the product. Use a product from [employer] List N: Disinfectants for Coronavirus (OFBPZ-02). Complete Disinfection Guidance When you can be around others after being sick with COVID-19 Deciding when you can be around others is different for different situations. Find out when you can safely end home isolation. For any additional questions about your care,contact your healthcare provider or state or local health department. 02/05/2020 Content source: [facility] for Immunization and Respiratory Diseases ([facility]), Division of Viral Diseases This information is not intended to replace advice given to you by your health care provider. Make sure you discuss any questions you have with your healthcare provider. Document Revised: 04/03/2020 Document Reviewed: 04/03/2020 Elsevier Patient Education  2022 [place] are being prescribed MOLNUPIRAVIR for COVID-19 infection.   Please call the pharmacy or go through the drive through vs going inside if you are picking up the mediation yourself to prevent further spread. If prescribed to a [facility] affiliated pharmacy, a pharmacist will bring the medication out to your car.   ADMINISTRATION INSTRUCTIONS: Take with or without food. Swallow the tablets whole. Don't chew, crush, or break the medications because it might not work as well  For each dose of the medication, you should be taking FOUR tablets at one time, TWICE a day   Finish your full five-day course of Molnupiravir even if you feel better before you're done. Stopping this medication too early can make it less effective to prevent severe illness related to [place].    Molnupiravir is prescribed for YOU ONLY. Don't share it with others, even if they have similar symptoms as you. This medication might not be right for everyone.   Make sure to take steps to protect yourself and others while you're taking this medication in order to get well soon and to prevent others from getting sick with COVID-19.   **If you are of childbearing potential (any gender) - it is advised to not get pregnant while taking this medication and recommended that condoms are used for female partners  the next 3 months after taking the medication out of extreme caution    COMMON SIDE EFFECTS: Diarrhea Nausea  Dizziness    If your COVID-19 symptoms get worse, get medical help right away. Call 911 if you experience symptoms such as worsening cough, trouble breathing, chest pain that doesn't go away, confusion, a hard time staying awake, and pale or blue-colored skin. This medication won't prevent all COVID-19 cases from getting worse.  Can take to lessen severity: Vit C '500mg'$  twice daily Quercertin 250-'500mg'$  twice daily Zinc 75-'100mg'$  daily Melatonin 3-6 mg at bedtime Vit D3 1000-2000 IU daily Aspirin 81 mg daily with food Optional: Famotidine '20mg'$  daily Also can add tylenol/ibuprofen as needed for fevers and body aches May add Mucinex or Mucinex DM as needed for cough/congestion  10 Things You Can Do to Manage Your COVID-19 Symptoms at  Home If you have possible or confirmed COVID-19 Stay home except to get medical care. Monitor your symptoms carefully. If your symptoms get worse, call your healthcare provider immediately. Get rest and stay hydrated. If you have a medical appointment, call the healthcare provider ahead of time and tell them that you have or may have COVID-19. For medical emergencies, call 911 and notify the dispatch personnel that you have or may have COVID-19. Cover your cough and sneezes with a tissue or use the inside of your elbow. Wash your hands often with soap and water for at least 20 seconds or clean your hands with an alcohol-based hand sanitizer that contains at least 60% alcohol. As much as possible, stay in a specific room and away from other people in your home. Also, you should use a separate bathroom, if available. If you need to be around other people in or outside of the home, wear a mask. Avoid sharing personal items with other people in your household, like dishes, towels, and bedding. Clean all surfaces that are touched often, like counters, tabletops, and doorknobs. Use household cleaning sprays or wipes according to the label instructions. June 09/13/2019 This information is not intended to replace advice given to you by your health care provider. Make sure you discuss any questions you have with your healthcare provider. Document Revised: 04/03/2020 Document Reviewed: 04/03/2020 Elsevier Patient Education  [place].

## 2020-09-25 ENCOUNTER — Encounter: Payer: Self-pay | Admitting: Physician Assistant

## 2020-09-25 DIAGNOSIS — E1165 Type 2 diabetes mellitus with hyperglycemia: Secondary | ICD-10-CM

## 2020-09-25 MED ORDER — BENZONATATE 100 MG PO CAPS: 100.0000 mg | ORAL_CAPSULE | Freq: Two times a day (BID) | ORAL | 0 refills | Status: DC | PRN

## 2020-09-25 NOTE — Addendum Note (Signed)
Addended by: Montine Circle B on: 09/25/2020 09:40 AM   Modules accepted: Orders

## 2020-10-06 MED ORDER — LANTUS SOLOSTAR 100 UNIT/ML ~~LOC~~ SOPN: PEN_INJECTOR | SUBCUTANEOUS | 99 refills | Status: DC

## 2020-10-12 ENCOUNTER — Other Ambulatory Visit (HOSPITAL_COMMUNITY): Payer: Self-pay

## 2020-10-26 ENCOUNTER — Telehealth: Payer: Self-pay | Admitting: Endocrinology

## 2020-10-26 NOTE — Telephone Encounter (Signed)
LVM--pt need to come to the office lab appt for B12 and hemoglobin A1c by Dr. Dwyane Dee.

## 2020-10-26 NOTE — Telephone Encounter (Signed)
Pt stated --had lab done at [facility] and did not  want to repeat any lab that is already done. Please advise

## 2020-10-26 NOTE — Telephone Encounter (Signed)
Pt has had blood work on 10/19/2020 and is wanting a call back due to wanting to know what is being tested for and is wondering if she needs blood work app

## 2020-10-29 ENCOUNTER — Other Ambulatory Visit (INDEPENDENT_AMBULATORY_CARE_PROVIDER_SITE_OTHER): Payer: 59

## 2020-10-29 ENCOUNTER — Other Ambulatory Visit: Payer: Self-pay

## 2020-10-29 DIAGNOSIS — E1165 Type 2 diabetes mellitus with hyperglycemia: Secondary | ICD-10-CM | POA: Diagnosis not present

## 2020-10-29 DIAGNOSIS — E538 Deficiency of other specified B group vitamins: Secondary | ICD-10-CM | POA: Diagnosis not present

## 2020-10-29 DIAGNOSIS — E782 Mixed hyperlipidemia: Secondary | ICD-10-CM

## 2020-10-29 LAB — LIPID PANEL
Cholesterol: 190 mg/dL (ref 0–200)
HDL: 59.6 mg/dL (ref >39.00)
LDL Cholesterol: 112 mg/dL — ABNORMAL HIGH (ref 0–99)
NonHDL: 130.27
Total CHOL/HDL Ratio: 3
Triglycerides: 91 mg/dL (ref 0.0–149.0)
VLDL: 18.2 mg/dL (ref 0.0–40.0)

## 2020-10-29 LAB — COMPREHENSIVE METABOLIC PANEL
ALT: 12 U/L (ref 0–35)
AST: 20 U/L (ref 0–37)
Albumin: 4.2 g/dL (ref 3.5–5.2)
Alkaline Phosphatase: 68 U/L (ref 39–117)
BUN: 21 mg/dL (ref 6–23)
CO2: 25 mEq/L (ref 19–32)
Calcium: 9.2 mg/dL (ref 8.4–10.5)
Chloride: 105 mEq/L (ref 96–112)
Creatinine, Ser: 0.57 mg/dL (ref 0.40–1.20)
GFR: 107.2 mL/min (ref >60.00)
Glucose, Bld: 123 mg/dL — ABNORMAL HIGH (ref 70–99)
Potassium: 4 mEq/L (ref 3.5–5.1)
Sodium: 139 mEq/L (ref 135–145)
Total Bilirubin: 0.3 mg/dL (ref 0.2–1.2)
Total Protein: 7.3 g/dL (ref 6.0–8.3)

## 2020-10-29 LAB — HEMOGLOBIN A1C: Hgb A1c MFr Bld: 7.1 % — ABNORMAL HIGH (ref 4.6–6.5)

## 2020-10-29 LAB — VITAMIN B12: Vitamin B-12: 823 pg/mL (ref 211–911)

## 2020-11-03 ENCOUNTER — Ambulatory Visit: Payer: 59 | Admitting: Endocrinology

## 2020-11-03 VITALS — BP 102/62 | HR 69 | Ht <= 58 in | Wt 124.2 lb

## 2020-11-03 DIAGNOSIS — E538 Deficiency of other specified B group vitamins: Secondary | ICD-10-CM

## 2020-11-03 DIAGNOSIS — E1165 Type 2 diabetes mellitus with hyperglycemia: Secondary | ICD-10-CM | POA: Diagnosis not present

## 2020-11-03 DIAGNOSIS — Z794 Long term (current) use of insulin: Secondary | ICD-10-CM

## 2020-11-03 DIAGNOSIS — E782 Mixed hyperlipidemia: Secondary | ICD-10-CM | POA: Diagnosis not present

## 2020-11-03 NOTE — Patient Instructions (Addendum)
Take lescol M/W/F  Check blood sugars on waking 3-4 up days a week  Also check blood sugars about 2 hours after meals and do this after different meals by rotation  Recommended blood sugar levels on waking up are 90-130 and about 2 hours after meal is 130-160  Please bring your blood sugar monitor to each visit, thank you

## 2020-11-03 NOTE — Progress Notes (Signed)
Patient ID: Anna Jennings, female   DOB: Apr 15, 1970, 50 y.o.   MRN: [id number]   Reason for Appointment : Followup for Type 2 Diabetes  History of Present Illness          Diagnosis: Type 2 diabetes mellitus, date of diagnosis: 2007       Past history: She was initially diagnosed with a two-hour postprandial glucose of 218 in 08/2005 and baseline A1c of 5.7 Apparently she was not treated with oral hypoglycemic drugs initially and started on metformin in 2009. She did have diarrhea with the regular metformin but tolerated extended release preparation better with no side effects even on 2000 mg She thinks her blood sugars are fairly well controlled initially but a couple of years later with higher readings she was given Amaryl also. The dose of this was gradually increased Her A1c readings have been ranging from 6.5-7.2 in between 2011 and 2013 but no further followup done after 11/2011 She had been out of her medications prior to her initial visit here in 12/14 and her blood sugars were progressively higher.  She had side effects of  bloating and swelling with pioglitazone and her [name] was stopped Because of poor control and A1c of 8.5 in 3/15 she was started on Levemir insulin  Recent history:   Oral hypoglycemic drugs the patient is taking are: Glyxambi 25/5,  metformin ER 2 g qd Insulin regimen: Lantus 8 hs, Lyumjev 5 units for high carbohydrate meals  A1c is 7.1, previously 6.6 as of 4/22  Current management, blood sugar data from her Dexcom download and problems identified are:  Unable to download her Dexcom as before and she did entered some blood sugars on the phone for review  About 3 to 4 weeks ago she was concerned about her morning sugars being higher, as much is 165  With starting Lantus 8 units at bedtime her blood sugars are significantly better in the morning although not consistent  However she says that she has been traveling a lot and occasionally she may  either forget her oral medications or insulin  She periodically will still have high postprandial readings depending on whether she is snacking without insulin or getting more carbohydrates  However blood sugars are usually not over 180  Lab fasting glucose 123  She says her blood sugars also will go up to as much as 300 if she is running or playing tennis but not when she is walking         Side effects from medications have been: Diarrhea from regular metformin   Glucose monitoring:    With Dexcom and One Touch Verio  Data from home records   PRE-MEAL Fasting Lunch Dinner Bedtime Overall  Glucose range: 91-126      Mean/median:     ?   POST-MEAL PC Breakfast PC Lunch PC Dinner  Glucose range:   90-114  Mean/median:         Wt Readings from Last 3 Encounters:  11/03/20 124 lb 3.2 oz (56.3 kg)  06/30/20 122 lb 3.2 oz (55.4 kg)  04/27/20 121 lb 12.8 oz (55.2 kg)     Glycemic control:  Lab Results  Component Value Date   HGBA1C 7.1 (H) 10/29/2020   HGBA1C 6.6 (H) 06/25/2020   HGBA1C 6.7 (H) 02/05/2020   Lab Results  Component Value Date   MICROALBUR <0.7 11/06/2019   LDLCALC 112 (H) 10/29/2020   CREATININE 0.57 10/29/2020   Lab Results  Component Value  Date   FRUCTOSAMINE 248 09/05/2016   FRUCTOSAMINE 322 (H) 08/17/2015    Self-care: The diet that the patient has been following is: tries to limit portions, usually has some carbohydrate at every meal     Meals: 2 meals per day.  no breakfast, am snack, sandwich at lunch;  Some rice at meals Occasionally which she thinks tends to raise her sugar for couple of days          Dietician visit: Most recent:2011?                 Compliance with the medical regimen: Good   Weight history:  Wt Readings from Last 3 Encounters:  11/03/20 124 lb 3.2 oz (56.3 kg)  06/30/20 122 lb 3.2 oz (55.4 kg)  04/27/20 121 lb 12.8 oz (55.2 kg)    Lab on 10/29/2020  Component Date Value Ref Range Status   Vitamin B-12  10/29/2020 823  211 - 911 pg/mL Final   Cholesterol 10/29/2020 190  0 - 200 mg/dL Final   ATP III Classification       Desirable:  < 200 mg/dL               Borderline High:  200 - 239 mg/dL          High:  > = 240 mg/dL   Triglycerides 10/29/2020 91.0  0.0 - 149.0 mg/dL Final   Normal:  <150 mg/dLBorderline High:  150 - 199 mg/dL   HDL 10/29/2020 59.60  >39.00 mg/dL Final   VLDL 10/29/2020 18.2  0.0 - 40.0 mg/dL Final   LDL Cholesterol 10/29/2020 112 (A) 0 - 99 mg/dL Final   Total CHOL/HDL Ratio 10/29/2020 3   Final                  Men          Women1/2 Average Risk     3.4          3.3Average Risk          5.0          4.42X Average Risk          9.6          7.13X Average Risk          15.0          11.0                       NonHDL 10/29/2020 130.27   Final   NOTE:  Non-HDL goal should be 30 mg/dL higher than patient's LDL goal (i.e. LDL goal of < 70 mg/dL, would have non-HDL goal of < 100 mg/dL)   Sodium 10/29/2020 139  135 - 145 mEq/L Final   Potassium 10/29/2020 4.0  3.5 - 5.1 mEq/L Final   Chloride 10/29/2020 105  96 - 112 mEq/L Final   CO2 10/29/2020 25  19 - 32 mEq/L Final   Glucose, Bld 10/29/2020 123 (A) 70 - 99 mg/dL Final   BUN 10/29/2020 21  6 - 23 mg/dL Final   Creatinine, Ser 10/29/2020 0.57  0.40 - 1.20 mg/dL Final   Total Bilirubin 10/29/2020 0.3  0.2 - 1.2 mg/dL Final   Alkaline Phosphatase 10/29/2020 68  39 - 117 U/L Final   AST 10/29/2020 20  0 - 37 U/L Final   ALT 10/29/2020 12  0 - 35 U/L Final   Total Protein 10/29/2020 7.3  6.0 - 8.3 g/dL Final  Albumin 10/29/2020 4.2  3.5 - 5.2 g/dL Final   GFR 10/29/2020 107.20  >60.00 mL/min Final   Calculated using the CKD-EPI Creatinine Equation (2021)   Calcium 10/29/2020 9.2  8.4 - 10.5 mg/dL Final   Hgb A1c MFr Bld 10/29/2020 7.1 (A) 4.6 - 6.5 % Final   Glycemic Control Guidelines for People with Diabetes:Non Diabetic:  <6%Goal of Therapy: <7%Additional Action Suggested:  >8%     Allergies as of 11/03/2020        Reactions   Sulfa Antibiotics Nausea Only   Still able to take but makes nausea        Medication List        Accurate as of November 03, 2020  9:35 AM. If you have any questions, ask your nurse or doctor.          BD Pen Needle Nano U/F 32G X 4 MM Misc Generic drug: Insulin Pen Needle USE 1 PEN NEEDLE TO INJECT INSULIN 4 TIMES DAILY   benzonatate 100 MG capsule Commonly known as: TESSALON Take 1 capsule (100 mg total) by mouth 2 (two) times daily as needed for cough.   Calcium-Magnesium-Vitamin D 185-50-100 MG-MG-UNIT Caps Take by mouth.   cephALEXin 500 MG capsule Commonly known as: KEFLEX Take 1 capsule (500 mg total) by mouth 2 (two) times daily.   cetirizine 10 MG tablet Commonly known as: ZYRTEC Take 20 mg by mouth daily.   Dexcom G6 Transmitter Misc 1 each by Does not apply route See admin instructions. Use one transmitter once every 90 days to transmit blood sugar.   diphenhydrAMINE 25 MG tablet Commonly known as: BENADRYL Take 25 mg by mouth at bedtime as needed.   Fiasp FlexTouch 100 UNIT/ML FlexTouch Pen Generic drug: insulin aspart Inject 5 units before each meal.   fluconazole 150 MG tablet Commonly known as: DIFLUCAN Take 1 tablet (150 mg total) by mouth daily. Take second tab 3days apart from first tab   fluvastatin 20 MG capsule Commonly known as: LESCOL Take 1 capsule (20 mg total) by mouth at bedtime.   FreeStyle Libre 2 Sensor Misc 2 Devices by Does not apply route every 14 (fourteen) days.   glucose blood test strip Use Onetouch verio test strips as instructed to check blood sugar three times daily.   Glyxambi 25-5 MG Tabs Generic drug: Empagliflozin-linaGLIPtin TAKE 1 TABLET BY MOUTH  DAILY   insulin lispro 100 UNIT/ML KwikPen Commonly known as: HumaLOG KwikPen Inject 5 Units into the skin 3 (three) times daily. Inject 5 units three times a day with meals   Lantus SoloStar 100 UNIT/ML Solostar Pen Generic drug: insulin  glargine Inject 6 to 8 units at bedtime daily   levocetirizine 5 MG tablet Commonly known as: XYZAL Take 5 mg by mouth every evening.   Lyumjev KwikPen 100 UNIT/ML KwikPen Generic drug: Insulin Lispro-aabc Inject 5 units before each meal   metFORMIN 500 MG 24 hr tablet Commonly known as: GLUCOPHAGE-XR TAKE 2 TABLETS BY MOUTH  TWICE DAILY   OneTouch Delica Lancets 07M Misc 1 each by Does not apply route 3 (three) times daily. Use onetouch delica lancets to check blood sugar three times daily.   OneTouch Verio Flex System w/Device Kit USE AS INSTRUCTED TO CALIBRATE DEXCOM SYSTEM UP TO THREE TIMES A DAY   Turmeric 500 MG Caps Take by mouth.   vitamin B-1 250 MG tablet Take 250 mg by mouth daily.   vitamin C 500 MG tablet Commonly known as: ASCORBIC ACID  Take 500 mg by mouth daily.   Vitamin D 50 MCG (2000 UT) tablet Take 2,000 Units by mouth daily.        Allergies:  Allergies  Allergen Reactions   Sulfa Antibiotics Nausea Only    Still able to take but makes nausea    Past Medical History:  Diagnosis Date   Diabetes mellitus without complication ([place])     Past Surgical History:  Procedure Laterality Date   APPENDECTOMY     CHOLECYSTECTOMY     PANCREAS SURGERY     Tumor head of pancreas, incompletely resected    Family History  Problem Relation Age of Onset   Hyperlipidemia Father    Hypertension Father    Coronary artery disease Father    Hearing loss Father    Diabetes Brother    Heart disease Paternal Grandfather     Social History:  reports that she has never smoked. She has never used smokeless tobacco. She reports current alcohol use. She reports that she does not use drugs.    Review of Systems       Lipids: was on Lipitor  previously when she stopped it because of generalized aches and pains which were significant  She  does have a fairly significant family history of CAD including her father  She was taking pravastatin 20 mg and this  was also stopped  in December 2017 because of muscle aching  She was advised to increase Lescol on the last visit to at least 3 times a day but she has been forgetting to take this She had tolerated this well and LDL was previously below 100 on twice a week  LDL is as follows:  Lab Results  Component Value Date   CHOL 190 10/29/2020   CHOL 172 06/25/2020   CHOL 204 (H) 02/05/2020   Lab Results  Component Value Date   HDL 59.60 10/29/2020   HDL 62.50 06/25/2020   HDL 67.80 02/05/2020   Lab Results  Component Value Date   LDLCALC 112 (H) 10/29/2020   LDLCALC 96 06/25/2020   LDLCALC 115 (H) 02/05/2020   Lab Results  Component Value Date   TRIG 91.0 10/29/2020   TRIG 65.0 06/25/2020   TRIG 104.0 02/05/2020   Lab Results  Component Value Date   CHOLHDL 3 10/29/2020   CHOLHDL 3 06/25/2020   CHOLHDL 3 02/05/2020   Lab Results  Component Value Date   LDLDIRECT 105.0 11/06/2019   LDLDIRECT 134.4 08/19/2013       She has had severe vitamin D deficiency diagnosed in 2009 with a level of 5.5. She is taking her recommended dose of vitamin D3, 4000 units with normal levels   Lab Results  Component Value Date   VD25OH 34.32 11/06/2019   VD25OH 48.67 01/04/2019   VD25OH 51.61 03/02/2018   VD25OH 41.60 09/05/2016    She had fatigue and her B12 level was low previously at 143 She has therapeutic level with B12 supplement  Lab Results  Component Value Date   VITAMINB12 823 10/29/2020   Recently found to have iron deficiency anemia  Physical Examination:  BP 102/62   Pulse 69   Ht _0  (1.473 m)   Wt 124 lb 3.2 oz (56.3 kg)   SpO2 99%   BMI 25.96 kg/m      ASSESSMENT/PLAN:   Diabetes type 2 nonobese  See history of present illness for detailed discussion of current management, blood sugar patterns and problems identified  She has  an A1c of 7.1 compared to 6.6  She is on Metformin and Glyxambi maximum doses  Now she is taking basal bolus insulin  although mostly taking basal insulin and Lyumjev when she is eating any carbohydrates  As before difficult to assess her blood sugar patterns because of not being able to get detailed information from her Dexcom and she has only sporadic blood sugar readings entered in her phone app, this could not be synchronized today With traveling and not taking her insulin or medications consistently as well as less exercise A1c has gone up  Recommendations: She will continue to use the Dexcom that is provided by her insurance She will also keep up with her record entry and her One Touch app  No change in insulin doses  She will try to be more consistent in taking her insulin before starting to eat for large snacks or meals with carbohydrate  Start walking for exercise If she wants to play tennis she can take 2 to 3 units of Lyumjev to cover the hyperglycemia  Recheck A1c in 3 months  B12 deficiency: Adequately replaced   LIPIDS: Not controlled from lack of compliance with her medication Needs to restart Lescol 20 mg 3 times a week since she has previously tolerated this well Follow-up in 3 months again   There are no Patient Instructions on file for this visit.      Elayne Snare 11/03/2020, 9:35 AM

## 2020-11-27 ENCOUNTER — Telehealth: Payer: Self-pay | Admitting: Family Medicine

## 2020-11-27 NOTE — Telephone Encounter (Signed)
Pt called in for an appointment, her arms have been going numb with tingling for the past month. I tried to transfer her over to Nurse Triage, but she hung up. They said they would call her

## 2020-12-08 ENCOUNTER — Other Ambulatory Visit: Payer: Self-pay

## 2020-12-09 ENCOUNTER — Encounter: Payer: Self-pay | Admitting: Neurology

## 2020-12-09 ENCOUNTER — Encounter: Payer: Self-pay | Admitting: Family Medicine

## 2020-12-09 ENCOUNTER — Ambulatory Visit: Payer: 59 | Admitting: Family Medicine

## 2020-12-09 VITALS — BP 100/66 | HR 77 | Temp 97.4°F | Ht <= 58 in | Wt 127.2 lb

## 2020-12-09 DIAGNOSIS — R202 Paresthesia of skin: Secondary | ICD-10-CM

## 2020-12-09 DIAGNOSIS — Z23 Encounter for immunization: Secondary | ICD-10-CM

## 2020-12-09 NOTE — Progress Notes (Signed)
Established Patient Office Visit  Subjective:  Patient ID: Anna Jennings, female    DOB: 10/17/1970  Age: 49 y.o. MRN: [id number]  CC:  Chief Complaint  Patient presents with   Follow-up    Pt c/o numbing and tingling feeling in both arms and hands x2 wks     HPI Anna Jennings presents for evaluation of a several week history of intermittent paresthesias in both of her arms.  These traveled down the medial part of her pends into her hands and across the backs of her hands.  They come and go at random.  She denies any neck pain, recent trauma fevers or chills.  She is also having intermittent paresthesias involving her right upper back..  Denies headaches blurred or double vision.  Denies dysphagia.  Denies unilateral weakness.  She had developed anemia after recent blood donation and is currently under work-up for this by her GI doctor.  Endoscopy and colonoscopy are planned in the near future.  Past Medical History:  Diagnosis Date   Diabetes mellitus without complication ([place])     Past Surgical History:  Procedure Laterality Date   APPENDECTOMY     CHOLECYSTECTOMY     PANCREAS SURGERY     Tumor head of pancreas, incompletely resected    Family History  Problem Relation Age of Onset   Hyperlipidemia Father    Hypertension Father    Coronary artery disease Father    Hearing loss Father    Diabetes Brother    Heart disease Paternal Grandfather     Social History   Socioeconomic History   Marital status: Married    Spouse name: Not on file   Number of children: Not on file   Years of education: Not on file   Highest education level: Not on file  Occupational History   Not on file  Tobacco Use   Smoking status: Never   Smokeless tobacco: Never  Substance and Sexual Activity   Alcohol use: Yes    Comment: Socially   Drug use: No   Sexual activity: Yes    Birth control/protection: None  Other Topics Concern   Not on file  Social History Narrative   Not on  file   Social Determinants of Health   Financial Resource Strain: Not on file  Food Insecurity: Not on file  Transportation Needs: Not on file  Physical Activity: Not on file  Stress: Not on file  Social Connections: Not on file  Intimate Partner Violence: Not on file    Outpatient Medications Prior to Visit  Medication Sig Dispense Refill   benzonatate (TESSALON) 100 MG capsule Take 1 capsule (100 mg total) by mouth 2 (two) times daily as needed for cough. (Patient not taking: Reported on 11/03/2020) 20 capsule 0   Blood Glucose Monitoring Suppl (ONETOUCH VERIO FLEX SYSTEM) w/Device KIT USE AS INSTRUCTED TO CALIBRATE DEXCOM SYSTEM UP TO THREE TIMES A DAY 1 kit 0   Calcium-Magnesium-Vitamin D 185-50-100 MG-MG-UNIT CAPS Take by mouth.     cephALEXin (KEFLEX) 500 MG capsule Take 1 capsule (500 mg total) by mouth 2 (two) times daily. (Patient not taking: Reported on 11/03/2020) 6 capsule 0   cetirizine (ZYRTEC) 10 MG tablet Take 20 mg by mouth daily.      Cholecalciferol (VITAMIN D) 50 MCG (2000 UT) tablet Take 2,000 Units by mouth daily.     Continuous Blood Gluc Sensor (FREESTYLE LIBRE 2 SENSOR) MISC 2 Devices by Does not apply route every 14 (fourteen) days.  2 each 3   Continuous Blood Gluc Transmit (DEXCOM G6 TRANSMITTER) MISC 1 each by Does not apply route See admin instructions. Use one transmitter once every 90 days to transmit blood sugar.     diphenhydrAMINE (BENADRYL) 25 MG tablet Take 25 mg by mouth at bedtime as needed.     fluconazole (DIFLUCAN) 150 MG tablet Take 1 tablet (150 mg total) by mouth daily. Take second tab 3days apart from first tab 2 tablet 0   fluvastatin (LESCOL) 20 MG capsule Take 1 capsule (20 mg total) by mouth at bedtime. (Patient not taking: Reported on 11/03/2020) 30 capsule 1   glucose blood test strip Use Onetouch verio test strips as instructed to check blood sugar three times daily. 100 each 2   GLYXAMBI 25-5 MG TABS TAKE 1 TABLET BY MOUTH  DAILY 90 tablet 3    insulin aspart (FIASP FLEXTOUCH) 100 UNIT/ML FlexTouch Pen Inject 5 units before each meal. (Patient not taking: Reported on 11/03/2020) 15 mL 3   insulin glargine (LANTUS SOLOSTAR) 100 UNIT/ML Solostar Pen Inject 6 to 8 units at bedtime daily (Patient not taking: Reported on 11/03/2020) 15 mL PRN   insulin lispro (HUMALOG KWIKPEN) 100 UNIT/ML KwikPen Inject 5 Units into the skin 3 (three) times daily. Inject 5 units three times a day with meals 15 mL 3   Insulin Lispro-aabc, 1 U Dial, (LYUMJEV KWIKPEN) 100 UNIT/ML SOPN Inject 5 units before each meal 15 mL 1   Insulin Pen Needle (BD PEN NEEDLE NANO U/F) 32G X 4 MM MISC USE 1 PEN NEEDLE TO INJECT INSULIN 4 TIMES DAILY 100 each 5   levocetirizine (XYZAL) 5 MG tablet Take 5 mg by mouth every evening.     metFORMIN (GLUCOPHAGE-XR) 500 MG 24 hr tablet TAKE 2 TABLETS BY MOUTH  TWICE DAILY 360 tablet 3   OneTouch Delica Lancets 02R MISC 1 each by Does not apply route 3 (three) times daily. Use onetouch delica lancets to check blood sugar three times daily. 100 each 2   Thiamine HCl (VITAMIN B-1) 250 MG tablet Take 250 mg by mouth daily.     Turmeric 500 MG CAPS Take by mouth.     vitamin C (ASCORBIC ACID) 500 MG tablet Take 500 mg by mouth daily.     No facility-administered medications prior to visit.    Allergies  Allergen Reactions   Sulfa Antibiotics Nausea Only    Still able to take but makes nausea    ROS Review of Systems  Constitutional:  Negative for diaphoresis, fatigue, fever and unexpected weight change.  HENT: Negative.    Eyes:  Negative for photophobia and visual disturbance.  Respiratory: Negative.    Cardiovascular: Negative.   Gastrointestinal: Negative.   Genitourinary: Negative.   Skin: Negative.   Neurological:  Positive for numbness. Negative for dizziness, facial asymmetry, speech difficulty, weakness, light-headedness and headaches.     Depression screen [facility] 2/9 12/09/2020 01/23/2017  Decreased Interest 0 0  Down,  Depressed, Hopeless 0 0  PHQ - 2 Score 0 0  Altered sleeping 1 -  Tired, decreased energy 1 -  Change in appetite 1 -  Feeling bad or failure about yourself  0 -  Trouble concentrating 1 -  Moving slowly or fidgety/restless 0 -  Suicidal thoughts 0 -  PHQ-9 Score 4 -  Difficult doing work/chores Not difficult at all -    GAD 7 : Generalized Anxiety Score 12/09/2020  Nervous, Anxious, on Edge 0  Control/stop worrying  0  Worry too much - different things 1  Trouble relaxing 0  Restless 0  Easily annoyed or irritable 1  Afraid - awful might happen 0  Total GAD 7 Score 2     Objective:    Physical Exam Vitals and nursing note reviewed.  Constitutional:      General: She is not in acute distress.    Appearance: Normal appearance. She is normal weight. She is not ill-appearing, toxic-appearing or diaphoretic.  HENT:     Head: Normocephalic and atraumatic.     Right Ear: Tympanic membrane, ear canal and external ear normal.     Left Ear: Tympanic membrane, ear canal and external ear normal.     Mouth/Throat:     Mouth: Mucous membranes are moist.     Pharynx: Oropharynx is clear. No oropharyngeal exudate or posterior oropharyngeal erythema.  Eyes:     General: No scleral icterus.       Right eye: No discharge.        Left eye: No discharge.     Extraocular Movements: Extraocular movements intact.     Conjunctiva/sclera: Conjunctivae normal.     Pupils: Pupils are equal, round, and reactive to light.  Neck:     Vascular: No carotid bruit.  Cardiovascular:     Rate and Rhythm: Normal rate and regular rhythm.     Pulses:          Dorsalis pedis pulses are 2+ on the right side and 2+ on the left side.       Posterior tibial pulses are 1+ on the right side and 1+ on the left side.  Pulmonary:     Effort: Pulmonary effort is normal.     Breath sounds: Normal breath sounds.  Abdominal:     General: Bowel sounds are normal.  Musculoskeletal:     Cervical back: No swelling,  deformity, signs of trauma, rigidity, spasms, tenderness or bony tenderness. No pain with movement. Normal range of motion.     Right lower leg: No edema.     Left lower leg: No edema.     Comments: Bilateral negative Spurling's test.  Lymphadenopathy:     Cervical: No cervical adenopathy.  Skin:    Findings: No rash.  Neurological:     Mental Status: She is alert and oriented to person, place, and time.     Cranial Nerves: Cranial nerves are intact. No cranial nerve deficit, dysarthria or facial asymmetry.     Motor: No weakness, tremor, atrophy or abnormal muscle tone.  Psychiatric:        Mood and Affect: Mood normal.        Behavior: Behavior normal.   Diabetic Foot Exam - Simple   Simple Foot Form Visual Inspection No deformities, no ulcerations, no other skin breakdown bilaterally: Yes Sensation Testing Intact to touch and monofilament testing bilaterally: Yes Pulse Check Posterior Tibialis and Dorsalis pulse intact bilaterally: Yes Comments     BP 100/66 (BP Location: Right Arm, Patient Position: Sitting, Cuff Size: Normal)   Pulse 77   Temp (!) 97.4 F (36.3 C) (Temporal)   Ht 4' 10"  (1.473 m)   Wt 127 lb 3.2 oz (57.7 kg)   SpO2 98%   BMI 26.58 kg/m  Wt Readings from Last 3 Encounters:  12/09/20 127 lb 3.2 oz (57.7 kg)  11/03/20 124 lb 3.2 oz (56.3 kg)  06/30/20 122 lb 3.2 oz (55.4 kg)     Health Maintenance Due  Topic Date  Due   TETANUS/TDAP  Never done   OPHTHALMOLOGY EXAM  07/07/2015   COLONOSCOPY (Pts 45-72yr Insurance coverage will need to be confirmed)  Never done   COVID-19 Vaccine (3 - Pfizer risk series) 07/04/2019   FOOT EXAM  07/05/2019   INFLUENZA VACCINE  09/28/2020   URINE MICROALBUMIN  11/05/2020    There are no preventive care reminders to display for this patient.  Lab Results  Component Value Date   TSH 2.48 01/07/2020   Lab Results  Component Value Date   WBC 4.9 01/07/2020   HGB 12.8 01/07/2020   HCT 38.6 01/07/2020   MCV  84.1 01/07/2020   PLT 289.0 01/07/2020   Lab Results  Component Value Date   NA 139 10/29/2020   K 4.0 10/29/2020   CO2 25 10/29/2020   GLUCOSE 123 (H) 10/29/2020   BUN 21 10/29/2020   CREATININE 0.57 10/29/2020   BILITOT 0.3 10/29/2020   ALKPHOS 68 10/29/2020   AST 20 10/29/2020   ALT 12 10/29/2020   PROT 7.3 10/29/2020   ALBUMIN 4.2 10/29/2020   CALCIUM 9.2 10/29/2020   ANIONGAP 7 05/20/2016   GFR 107.20 10/29/2020   Lab Results  Component Value Date   CHOL 190 10/29/2020   Lab Results  Component Value Date   HDL 59.60 10/29/2020   Lab Results  Component Value Date   LDLCALC 112 (H) 10/29/2020   Lab Results  Component Value Date   TRIG 91.0 10/29/2020   Lab Results  Component Value Date   CHOLHDL 3 10/29/2020   Lab Results  Component Value Date   HGBA1C 7.1 (H) 10/29/2020      Assessment & Plan:   Problem List Items Addressed This Visit   None Visit Diagnoses     Paresthesias    -  Primary   Relevant Orders   MR Brain W Wo Contrast   Ambulatory referral to Neurology       No orders of the defined types were placed in this encounter.   Follow-up: Return in about 4 weeks (around 01/06/2021), or if symptoms worsen or fail to improve.  Spent over 40 minutes with this patient taking her history, using the medical chart, examining her and planning her care.  WLibby Maw MD

## 2020-12-26 ENCOUNTER — Ambulatory Visit (HOSPITAL_BASED_OUTPATIENT_CLINIC_OR_DEPARTMENT_OTHER): Payer: 59

## 2021-01-12 ENCOUNTER — Telehealth: Payer: Self-pay | Admitting: Family Medicine

## 2021-01-12 NOTE — Telephone Encounter (Signed)
Pt's referral put in on 12/09/20 for a MR Brain W Wo contrast has to be put at the right facility for per her authorization. It has been placed at a diagnostic radiology and imaging site. Please call Melissa at [phone] ext [phone] if any questions. The procedure has been scheduled for 01/16/21.

## 2021-01-16 ENCOUNTER — Ambulatory Visit (HOSPITAL_BASED_OUTPATIENT_CLINIC_OR_DEPARTMENT_OTHER): Payer: 59

## 2021-01-28 ENCOUNTER — Other Ambulatory Visit (INDEPENDENT_AMBULATORY_CARE_PROVIDER_SITE_OTHER): Payer: 59

## 2021-01-28 ENCOUNTER — Other Ambulatory Visit: Payer: Self-pay

## 2021-01-28 DIAGNOSIS — E1165 Type 2 diabetes mellitus with hyperglycemia: Secondary | ICD-10-CM

## 2021-01-28 DIAGNOSIS — E782 Mixed hyperlipidemia: Secondary | ICD-10-CM

## 2021-01-28 DIAGNOSIS — Z794 Long term (current) use of insulin: Secondary | ICD-10-CM | POA: Diagnosis not present

## 2021-01-28 LAB — URINALYSIS, ROUTINE W REFLEX MICROSCOPIC
Bilirubin Urine: NEGATIVE
Hgb urine dipstick: NEGATIVE
Ketones, ur: NEGATIVE
Leukocytes,Ua: NEGATIVE
Nitrite: NEGATIVE
Specific Gravity, Urine: 1.025 (ref 1.000–1.030)
Total Protein, Urine: NEGATIVE
Urine Glucose: >=1000 — AB
Urobilinogen, UA: 0.2 (ref 0.0–1.0)
pH: 5.5 (ref 5.0–8.0)

## 2021-01-28 LAB — COMPREHENSIVE METABOLIC PANEL
ALT: 11 U/L (ref 0–35)
AST: 16 U/L (ref 0–37)
Albumin: 4.1 g/dL (ref 3.5–5.2)
Alkaline Phosphatase: 67 U/L (ref 39–117)
BUN: 16 mg/dL (ref 6–23)
CO2: 29 mEq/L (ref 19–32)
Calcium: 8.9 mg/dL (ref 8.4–10.5)
Chloride: 104 mEq/L (ref 96–112)
Creatinine, Ser: 0.61 mg/dL (ref 0.40–1.20)
GFR: 105.27 mL/min (ref >60.00)
Glucose, Bld: 127 mg/dL — ABNORMAL HIGH (ref 70–99)
Potassium: 4 mEq/L (ref 3.5–5.1)
Sodium: 139 mEq/L (ref 135–145)
Total Bilirubin: 0.4 mg/dL (ref 0.2–1.2)
Total Protein: 6.9 g/dL (ref 6.0–8.3)

## 2021-01-28 LAB — LIPID PANEL
Cholesterol: 203 mg/dL — ABNORMAL HIGH (ref 0–200)
HDL: 65.4 mg/dL (ref >39.00)
LDL Cholesterol: 118 mg/dL — ABNORMAL HIGH (ref 0–99)
NonHDL: 137.83
Total CHOL/HDL Ratio: 3
Triglycerides: 100 mg/dL (ref 0.0–149.0)
VLDL: 20 mg/dL (ref 0.0–40.0)

## 2021-01-28 LAB — MICROALBUMIN / CREATININE URINE RATIO
Creatinine,U: 104.3 mg/dL
Microalb Creat Ratio: 0.9 mg/g (ref 0.0–30.0)
Microalb, Ur: 0.9 mg/dL (ref 0.0–1.9)

## 2021-01-28 LAB — HEMOGLOBIN A1C: Hgb A1c MFr Bld: 6.2 % (ref 4.6–6.5)

## 2021-02-02 ENCOUNTER — Ambulatory Visit (INDEPENDENT_AMBULATORY_CARE_PROVIDER_SITE_OTHER): Payer: 59 | Admitting: Endocrinology

## 2021-02-02 ENCOUNTER — Other Ambulatory Visit: Payer: Self-pay

## 2021-02-02 VITALS — BP 118/80 | HR 93 | Ht <= 58 in | Wt 131.2 lb

## 2021-02-02 DIAGNOSIS — E1165 Type 2 diabetes mellitus with hyperglycemia: Secondary | ICD-10-CM

## 2021-02-02 DIAGNOSIS — Z794 Long term (current) use of insulin: Secondary | ICD-10-CM | POA: Diagnosis not present

## 2021-02-02 DIAGNOSIS — E782 Mixed hyperlipidemia: Secondary | ICD-10-CM

## 2021-02-02 DIAGNOSIS — E119 Type 2 diabetes mellitus without complications: Secondary | ICD-10-CM

## 2021-02-02 LAB — POCT GLYCOSYLATED HEMOGLOBIN (HGB A1C): Hemoglobin A1C: 6.4 % — AB (ref 4.0–5.6)

## 2021-02-02 MED ORDER — LYUMJEV KWIKPEN 100 UNIT/ML ~~LOC~~ SOPN: PEN_INJECTOR | SUBCUTANEOUS | 1 refills | Status: DC

## 2021-02-02 MED ORDER — FLUVASTATIN SODIUM 20 MG PO CAPS: 20.0000 mg | ORAL_CAPSULE | Freq: Every day | ORAL | 1 refills | Status: DC

## 2021-02-02 MED ORDER — LANTUS SOLOSTAR 100 UNIT/ML ~~LOC~~ SOPN: PEN_INJECTOR | SUBCUTANEOUS | 99 refills | Status: DC

## 2021-02-02 MED ORDER — BD PEN NEEDLE NANO U/F 32G X 4 MM MISC: 5 refills | Status: DC

## 2021-02-02 NOTE — Patient Instructions (Signed)
Take Fluvastatin 3/7 days

## 2021-02-02 NOTE — Progress Notes (Signed)
Patient ID: Anna Jennings, female   DOB: 1970-09-08, 50 y.o.   MRN: [id number]   Reason for Appointment : Followup for Type 2 Diabetes  History of Present Illness          Diagnosis: Type 2 diabetes mellitus, date of diagnosis: 2007       Past history: She was initially diagnosed with a two-hour postprandial glucose of 218 in 08/2005 and baseline A1c of 5.7 Apparently she was not treated with oral hypoglycemic drugs initially and started on metformin in 2009. She did have diarrhea with the regular metformin but tolerated extended release preparation better with no side effects even on 2000 mg She thinks her blood sugars are fairly well controlled initially but a couple of years later with higher readings she was given Amaryl also. The dose of this was gradually increased Her A1c readings have been ranging from 6.5-7.2 in between 2011 and 2013 but no further followup done after 11/2011 She had been out of her medications prior to her initial visit here in 12/14 and her blood sugars were progressively higher.  She had side effects of  bloating and swelling with pioglitazone and her [name] was stopped Because of poor control and A1c of 8.5 in 3/15 she was started on Levemir insulin  Recent history:   Oral hypoglycemic drugs the patient is taking are: Glyxambi 25/5,  metformin ER 2 g qd Insulin regimen: Lantus 10 hs, Lyumjev 5 units for high carbohydrate meals  A1c is better than usual at 6.2  Current management, blood sugar data from her Dexcom download and problems identified are:  Now able to download her Dexcom since she has a full version  However has used this only for 4 or 5 days in the last couple of weeks  Overall blood sugars are well controlled but will occasionally have high readings after breakfast or evenings based on her diet or carbohydrate intake  A couple of times has not had her insulin available for mealtime coverage  She again says that she has been off her  diet because of traveling Has gained weight  Has not done much exercise regularly but is planning to start some walking again  She has gone up another couple of units on her Lantus as overnight sugars had been higher However average sugar in the morning still about 140 from her sensor data Lab fasting glucose 127         Side effects from medications have been: Diarrhea from regular metformin   Glucose monitoring:    With Dexcom and One Touch Verio  Data from home records  CGM use % of time 29  2-week average/GV 144  Time in range       85%  % Time Above 180 13+2  % Time above 250   % Time Below 70 0     Previously:  PRE-MEAL Fasting Lunch Dinner Bedtime Overall  Glucose range: 91-126      Mean/median:     ?   POST-MEAL PC Breakfast PC Lunch PC Dinner  Glucose range:   90-114  Mean/median:         Wt Readings from Last 3 Encounters:  02/02/21 131 lb 3.2 oz (59.5 kg)  12/09/20 127 lb 3.2 oz (57.7 kg)  11/03/20 124 lb 3.2 oz (56.3 kg)     Glycemic control:  Lab Results  Component Value Date   HGBA1C 6.4 (A) 02/02/2021   HGBA1C 6.2 01/28/2021   HGBA1C 7.1 (H)  10/29/2020   Lab Results  Component Value Date   MICROALBUR 0.9 01/28/2021   LDLCALC 118 (H) 01/28/2021   CREATININE 0.61 01/28/2021   Lab Results  Component Value Date   FRUCTOSAMINE 248 09/05/2016   FRUCTOSAMINE 322 (H) 08/17/2015    Self-care: The diet that the patient has been following is: tries to limit portions, usually has some carbohydrate at every meal     Meals: 2 meals per day.  no breakfast, am snack, sandwich at lunch;  Some rice at meals Occasionally which she thinks tends to raise her sugar for couple of days          Dietician visit: Most recent:2011?                 Compliance with the medical regimen: Good   Weight history:  Wt Readings from Last 3 Encounters:  02/02/21 131 lb 3.2 oz (59.5 kg)  12/09/20 127 lb 3.2 oz (57.7 kg)  11/03/20 124 lb 3.2 oz (56.3 kg)    Office  Visit on 02/02/2021  Component Date Value Ref Range Status   Hemoglobin A1C 02/02/2021 6.4 (A)  4.0 - 5.6 % Final  Lab on 01/28/2021  Component Date Value Ref Range Status   Cholesterol 01/28/2021 203 (H)  0 - 200 mg/dL Final   ATP III Classification       Desirable:  < 200 mg/dL               Borderline High:  200 - 239 mg/dL          High:  > = 240 mg/dL   Triglycerides 01/28/2021 100.0  0.0 - 149.0 mg/dL Final   Normal:  <150 mg/dLBorderline High:  150 - 199 mg/dL   HDL 01/28/2021 65.40  >39.00 mg/dL Final   VLDL 01/28/2021 20.0  0.0 - 40.0 mg/dL Final   LDL Cholesterol 01/28/2021 118 (H)  0 - 99 mg/dL Final   Total CHOL/HDL Ratio 01/28/2021 3   Final                  Men          Women1/2 Average Risk     3.4          3.3Average Risk          5.0          4.42X Average Risk          9.6          7.13X Average Risk          15.0          11.0                       NonHDL 01/28/2021 137.83   Final   NOTE:  Non-HDL goal should be 30 mg/dL higher than patient's LDL goal (i.e. LDL goal of < 70 mg/dL, would have non-HDL goal of < 100 mg/dL)   Sodium 01/28/2021 139  135 - 145 mEq/L Final   Potassium 01/28/2021 4.0  3.5 - 5.1 mEq/L Final   Chloride 01/28/2021 104  96 - 112 mEq/L Final   CO2 01/28/2021 29  19 - 32 mEq/L Final   Glucose, Bld 01/28/2021 127 (H)  70 - 99 mg/dL Final   BUN 01/28/2021 16  6 - 23 mg/dL Final   Creatinine, Ser 01/28/2021 0.61  0.40 - 1.20 mg/dL Final   Total Bilirubin 01/28/2021 0.4  0.2 - 1.2  mg/dL Final   Alkaline Phosphatase 01/28/2021 67  39 - 117 U/L Final   AST 01/28/2021 16  0 - 37 U/L Final   ALT 01/28/2021 11  0 - 35 U/L Final   Total Protein 01/28/2021 6.9  6.0 - 8.3 g/dL Final   Albumin 01/28/2021 4.1  3.5 - 5.2 g/dL Final   GFR 01/28/2021 105.27  >60.00 mL/min Final   Calculated using the CKD-EPI Creatinine Equation (2021)   Calcium 01/28/2021 8.9  8.4 - 10.5 mg/dL Final   Hgb A1c MFr Bld 01/28/2021 6.2  4.6 - 6.5 % Final   Glycemic Control  Guidelines for People with Diabetes:Non Diabetic:  <6%Goal of Therapy: <7%Additional Action Suggested:  >8%    Color, Urine 01/28/2021 YELLOW  Yellow;Lt. Yellow;Straw;Dark Yellow;Amber;Green;Red;Brown Final   APPearance 01/28/2021 CLEAR  Clear;Turbid;Slightly Cloudy;Cloudy Final   Specific Gravity, Urine 01/28/2021 1.025  1.000 - 1.030 Final   pH 01/28/2021 5.5  5.0 - 8.0 Final   Total Protein, Urine 01/28/2021 NEGATIVE  Negative Final   Urine Glucose 01/28/2021 >=1000 (A)  Negative Final   Ketones, ur 01/28/2021 NEGATIVE  Negative Final   Bilirubin Urine 01/28/2021 NEGATIVE  Negative Final   Hgb urine dipstick 01/28/2021 NEGATIVE  Negative Final   Urobilinogen, UA 01/28/2021 0.2  0.0 - 1.0 Final   Leukocytes,Ua 01/28/2021 NEGATIVE  Negative Final   Nitrite 01/28/2021 NEGATIVE  Negative Final   WBC, UA 01/28/2021 0-2/hpf  0-2/hpf Final   RBC / HPF 01/28/2021 0-2/hpf  0-2/hpf Final   Squamous Epithelial / LPF 01/28/2021 Rare(0-4/hpf)  Rare(0-4/hpf) Final   Bacteria, UA 01/28/2021 Rare(<10/hpf) (A)  None Final   Yeast, UA 01/28/2021 Presence of (A)  None Final   Microalb, Ur 01/28/2021 0.9  0.0 - 1.9 mg/dL Final   Creatinine,U 01/28/2021 104.3  mg/dL Final   Microalb Creat Ratio 01/28/2021 0.9  0.0 - 30.0 mg/g Final    Allergies as of 02/02/2021       Reactions   Sulfa Antibiotics Nausea Only   Still able to take but makes nausea        Medication List        Accurate as of February 02, 2021  9:28 AM. If you have any questions, ask your nurse or doctor.          BD Pen Needle Nano U/F 32G X 4 MM Misc Generic drug: Insulin Pen Needle USE 1 PEN NEEDLE TO INJECT INSULIN 4 TIMES DAILY   benzonatate 100 MG capsule Commonly known as: TESSALON Take 1 capsule (100 mg total) by mouth 2 (two) times daily as needed for cough.   Calcium-Magnesium-Vitamin D 185-50-100 MG-MG-UNIT Caps Take by mouth.   cephALEXin 500 MG capsule Commonly known as: KEFLEX Take 1 capsule (500 mg  total) by mouth 2 (two) times daily.   cetirizine 10 MG tablet Commonly known as: ZYRTEC Take 20 mg by mouth daily.   Dexcom G6 Transmitter Misc 1 each by Does not apply route See admin instructions. Use one transmitter once every 90 days to transmit blood sugar.   diphenhydrAMINE 25 MG tablet Commonly known as: BENADRYL Take 25 mg by mouth at bedtime as needed.   Fiasp FlexTouch 100 UNIT/ML FlexTouch Pen Generic drug: insulin aspart Inject 5 units before each meal.   fluconazole 150 MG tablet Commonly known as: DIFLUCAN Take 1 tablet (150 mg total) by mouth daily. Take second tab 3days apart from first tab   fluvastatin 20 MG capsule Commonly known as: LESCOL Take 1  capsule (20 mg total) by mouth at bedtime.   FreeStyle Libre 2 Sensor Misc 2 Devices by Does not apply route every 14 (fourteen) days.   glucose blood test strip Use Onetouch verio test strips as instructed to check blood sugar three times daily.   Glyxambi 25-5 MG Tabs Generic drug: Empagliflozin-linaGLIPtin TAKE 1 TABLET BY MOUTH  DAILY   insulin lispro 100 UNIT/ML KwikPen Commonly known as: HumaLOG KwikPen Inject 5 Units into the skin 3 (three) times daily. Inject 5 units three times a day with meals   Lantus SoloStar 100 UNIT/ML Solostar Pen Generic drug: insulin glargine Inject 6 to 8 units at bedtime daily   levocetirizine 5 MG tablet Commonly known as: XYZAL Take 5 mg by mouth every evening.   Lyumjev KwikPen 100 UNIT/ML KwikPen Generic drug: Insulin Lispro-aabc Inject 5 units before each meal   metFORMIN 500 MG 24 hr tablet Commonly known as: GLUCOPHAGE-XR TAKE 2 TABLETS BY MOUTH  TWICE DAILY   OneTouch Delica Lancets 48G Misc 1 each by Does not apply route 3 (three) times daily. Use onetouch delica lancets to check blood sugar three times daily.   OneTouch Verio Flex System w/Device Kit USE AS INSTRUCTED TO CALIBRATE DEXCOM SYSTEM UP TO THREE TIMES A DAY   Turmeric 500 MG Caps Take  by mouth.   vitamin B-1 250 MG tablet Take 250 mg by mouth daily.   vitamin C 500 MG tablet Commonly known as: ASCORBIC ACID Take 500 mg by mouth daily.   Vitamin D 50 MCG (2000 UT) tablet Take 2,000 Units by mouth daily.        Allergies:  Allergies  Allergen Reactions   Sulfa Antibiotics Nausea Only    Still able to take but makes nausea    Past Medical History:  Diagnosis Date   Diabetes mellitus without complication ([place])     Past Surgical History:  Procedure Laterality Date   APPENDECTOMY     CHOLECYSTECTOMY     PANCREAS SURGERY     Tumor head of pancreas, incompletely resected    Family History  Problem Relation Age of Onset   Hyperlipidemia Father    Hypertension Father    Coronary artery disease Father    Hearing loss Father    Diabetes Brother    Heart disease Paternal Grandfather     Social History:  reports that she has never smoked. She has never used smokeless tobacco. She reports current alcohol use. She reports that she does not use drugs.    Review of Systems       Lipids: was on Lipitor  previously when she stopped it because of generalized aches and pains which were significant  She  does have a fairly significant family history of CAD including her father  She was taking pravastatin 20 mg and this was also stopped  in December 2017 because of muscle aching  She has been recommended taking her fluvastatin 20 mg at least 3 times a day but she has been forgetting to take this again Previously when taking this regularly her LDL was below 100 Also has not had a recent prescription  Also she thinks her diet has been poor and her LDL is now 118  LDL is as follows:  Lab Results  Component Value Date   CHOL 203 (H) 01/28/2021   CHOL 190 10/29/2020   CHOL 172 06/25/2020   Lab Results  Component Value Date   HDL 65.40 01/28/2021   HDL 59.60 10/29/2020  HDL 62.50 06/25/2020   Lab Results  Component Value Date   LDLCALC 118 (H)  01/28/2021   LDLCALC 112 (H) 10/29/2020   LDLCALC 96 06/25/2020   Lab Results  Component Value Date   TRIG 100.0 01/28/2021   TRIG 91.0 10/29/2020   TRIG 65.0 06/25/2020   Lab Results  Component Value Date   CHOLHDL 3 01/28/2021   CHOLHDL 3 10/29/2020   CHOLHDL 3 06/25/2020   Lab Results  Component Value Date   LDLDIRECT 105.0 11/06/2019   LDLDIRECT 134.4 08/19/2013       She has had severe vitamin D deficiency diagnosed in 2009 with a level of 5.5. She is taking her recommended dose of vitamin D3, 4000 units with normal levels   Lab Results  Component Value Date   VD25OH 34.32 11/06/2019   VD25OH 48.67 01/04/2019   VD25OH 51.61 03/02/2018   VD25OH 41.60 09/05/2016    She had fatigue and her B12 level was low previously at 143 She has therapeutic level with B12 supplement  Lab Results  Component Value Date   VITAMINB12 823 10/29/2020   She has had iron deficiency anemia with symptoms of fatigue This has been managed elsewhere  Hair loss: She says she has had hair loss for several months but little over the last 2 months and she may be noticing a slight thinning in the temporal areas TSH checked by gynecologist was normal in August  Lab Results  Component Value Date   TSH 2.48 01/07/2020   '  Physical Examination:  BP 118/80   Pulse 93   Ht _0  (1.473 m)   Wt 131 lb 3.2 oz (59.5 kg)   SpO2 96%   BMI 27.42 kg/m      ASSESSMENT/PLAN:   Diabetes type 2 nonobese  See history of present illness for detailed discussion of current management, blood sugar patterns and problems identified  She has an A1c of 6.2  She is on Metformin and Glyxambi maximum doses along with Lantus 10 units daily She also takes mealtime insulin occasionally for high carbohydrate meals Although she thinks that she has generally been inconsistent with her diet and exercise her A1c has improved especially with continuing basal insulin  However recently has not had much  data available on her Dexcom sensor Only rarely has postprandial hyperglycemia  Microalbumin normal  Recommendations: She will try to use her sensor more regularly Stay on the same dose of Lantus unless morning sugars are consistently over 130 She will try to take her Lyumjev 3 to 5 units when she is eating high carbohydrate meals and make sure she takes it when she is eating out Start regular exercise program at least with walking  Recheck A1c in 3 months  Hair loss: Unlikely to be from thyroid disease as recent TSH was normal Recommend that she discuss with her dermatologist, currently does appears to be perimenopausal by history   LIPIDS: Still poorly controlled LDL  Needs to restart Lescol 20 mg 3 times a week and she says she can put this in her pillbox now We will recheck labs on the next visit   There are no Patient Instructions on file for this visit.      Elayne Snare 02/02/2021, 9:28 AM

## 2021-03-05 ENCOUNTER — Ambulatory Visit: Payer: 59 | Admitting: Neurology

## 2021-03-18 DIAGNOSIS — H00025 Hordeolum internum left lower eyelid: Secondary | ICD-10-CM | POA: Diagnosis not present

## 2021-03-19 ENCOUNTER — Other Ambulatory Visit: Payer: Self-pay | Admitting: Endocrinology

## 2021-03-22 DIAGNOSIS — K219 Gastro-esophageal reflux disease without esophagitis: Secondary | ICD-10-CM | POA: Diagnosis not present

## 2021-03-22 DIAGNOSIS — C254 Malignant neoplasm of endocrine pancreas: Secondary | ICD-10-CM | POA: Diagnosis not present

## 2021-03-22 DIAGNOSIS — R0789 Other chest pain: Secondary | ICD-10-CM | POA: Diagnosis not present

## 2021-03-22 DIAGNOSIS — D509 Iron deficiency anemia, unspecified: Secondary | ICD-10-CM | POA: Diagnosis not present

## 2021-04-05 DIAGNOSIS — D509 Iron deficiency anemia, unspecified: Secondary | ICD-10-CM | POA: Diagnosis not present

## 2021-05-18 ENCOUNTER — Other Ambulatory Visit: Payer: Self-pay | Admitting: Endocrinology

## 2021-05-28 ENCOUNTER — Other Ambulatory Visit: Payer: 59

## 2021-05-28 DIAGNOSIS — M792 Neuralgia and neuritis, unspecified: Secondary | ICD-10-CM | POA: Diagnosis not present

## 2021-06-03 ENCOUNTER — Ambulatory Visit: Payer: 59 | Admitting: Endocrinology

## 2021-06-15 ENCOUNTER — Other Ambulatory Visit (INDEPENDENT_AMBULATORY_CARE_PROVIDER_SITE_OTHER): Payer: BC Managed Care – PPO

## 2021-06-15 DIAGNOSIS — E1165 Type 2 diabetes mellitus with hyperglycemia: Secondary | ICD-10-CM | POA: Diagnosis not present

## 2021-06-15 DIAGNOSIS — Z794 Long term (current) use of insulin: Secondary | ICD-10-CM

## 2021-06-15 DIAGNOSIS — E782 Mixed hyperlipidemia: Secondary | ICD-10-CM

## 2021-06-15 LAB — LIPID PANEL
Cholesterol: 196 mg/dL (ref 0–200)
HDL: 56.2 mg/dL (ref >39.00)
LDL Cholesterol: 122 mg/dL — ABNORMAL HIGH (ref 0–99)
NonHDL: 139.82
Total CHOL/HDL Ratio: 3
Triglycerides: 87 mg/dL (ref 0.0–149.0)
VLDL: 17.4 mg/dL (ref 0.0–40.0)

## 2021-06-15 LAB — COMPREHENSIVE METABOLIC PANEL
ALT: 14 U/L (ref 0–35)
AST: 18 U/L (ref 0–37)
Albumin: 4.3 g/dL (ref 3.5–5.2)
Alkaline Phosphatase: 74 U/L (ref 39–117)
BUN: 23 mg/dL (ref 6–23)
CO2: 30 mEq/L (ref 19–32)
Calcium: 9 mg/dL (ref 8.4–10.5)
Chloride: 106 mEq/L (ref 96–112)
Creatinine, Ser: 0.61 mg/dL (ref 0.40–1.20)
GFR: 104.99 mL/min (ref >60.00)
Glucose, Bld: 119 mg/dL — ABNORMAL HIGH (ref 70–99)
Potassium: 4.4 mEq/L (ref 3.5–5.1)
Sodium: 143 mEq/L (ref 135–145)
Total Bilirubin: 0.5 mg/dL (ref 0.2–1.2)
Total Protein: 6.9 g/dL (ref 6.0–8.3)

## 2021-06-15 LAB — HEMOGLOBIN A1C: Hgb A1c MFr Bld: 6.3 % (ref 4.6–6.5)

## 2021-06-17 ENCOUNTER — Ambulatory Visit (INDEPENDENT_AMBULATORY_CARE_PROVIDER_SITE_OTHER): Payer: BC Managed Care – PPO | Admitting: Endocrinology

## 2021-06-17 ENCOUNTER — Encounter: Payer: Self-pay | Admitting: Endocrinology

## 2021-06-17 VITALS — BP 100/66 | HR 88 | Ht <= 58 in | Wt 131.0 lb

## 2021-06-17 DIAGNOSIS — E782 Mixed hyperlipidemia: Secondary | ICD-10-CM | POA: Diagnosis not present

## 2021-06-17 DIAGNOSIS — E559 Vitamin D deficiency, unspecified: Secondary | ICD-10-CM

## 2021-06-17 DIAGNOSIS — E119 Type 2 diabetes mellitus without complications: Secondary | ICD-10-CM

## 2021-06-17 DIAGNOSIS — Z923 Personal history of irradiation: Secondary | ICD-10-CM | POA: Diagnosis not present

## 2021-06-17 DIAGNOSIS — D508 Other iron deficiency anemias: Secondary | ICD-10-CM | POA: Diagnosis not present

## 2021-06-17 DIAGNOSIS — C254 Malignant neoplasm of endocrine pancreas: Secondary | ICD-10-CM | POA: Diagnosis not present

## 2021-06-17 MED ORDER — FREESTYLE LIBRE 3 SENSOR MISC: 1.0000 | 2 refills | Status: DC

## 2021-06-17 NOTE — Patient Instructions (Signed)
Take Lescol 3 days a week ? ? ?

## 2021-06-17 NOTE — Progress Notes (Signed)
? ? ? ?Patient ID: Anna Jennings, female   DOB: 28-Dec-1970, 51 y.o.   MRN: [id number] ? ? ?Reason for Appointment : Followup for Type 2 Diabetes ? ?History of Present Illness  ?        ?Diagnosis: Type 2 diabetes mellitus, date of diagnosis: 2007      ? ?Past history: She was initially diagnosed with a two-hour postprandial glucose of 218 in 08/2005 and baseline A1c of 5.7 ?Apparently she was not treated with oral hypoglycemic drugs initially and started on metformin in 2009. She did have diarrhea with the regular metformin but tolerated extended release preparation better with no side effects even on 2000 mg ?She thinks her blood sugars are fairly well controlled initially but a couple of years later with higher readings she was given Amaryl also. The dose of this was gradually increased ?Her A1c readings have been ranging from 6.5-7.2 in between 2011 and 2013 but no further followup done after 11/2011 ?She had been out of her medications prior to her initial visit here in 12/14 and her blood sugars were progressively higher.  ?She had side effects of  bloating and swelling with pioglitazone and her [name] was stopped ?Because of poor control and A1c of 8.5 in 3/15 she was started on Levemir insulin ? ?Recent history:  ? ?Oral hypoglycemic drugs the patient is taking are: Glyxambi 25/5,  metformin ER 2 g qd ? ?Insulin regimen: Lantus 12 hs, Lyumjev 3-4 units as needed ? ?A1c is same as usual at 6.3 ? ?Current management, blood sugar data from her Dexcom download and problems identified are: ? ?Now able to do CGM with the freestyle libre sensor sensor Dexcom is not covered  ?her Dexcom since she has a full version  ?However not clear if her sensor has been compared to fingersticks  ?She has gone up to 12 units on her Lantus on her own but appears to be having some low normal or slightly low readings overnight about 10 to 14 days ago  ?However she does not appear to be taking her Lyumjev as directed before high  carbohydrate meals and her documentation indicates she only takes it when her blood sugar goes up  ?Review of her sensor indicates that her blood sugar might go up either after lunch or dinner or with snacks  ?Also she says that this morning her blood sugar went up simply by exercising with playing tennis when she was fasting ?She has no significant change in her weight ?Generally trying to exercise periodically ?No change in renal function with continuing Jardiance ?Overall blood sugars are well controlled ?93% within target range in the last 2 weeks ?Lab fasting glucose 119 ? ?      ? Side effects from medications have been: Diarrhea from regular metformin ?  ?Glucose monitoring:    Freestyle libre 2 ? ?Analysis of her CGM ? ?On an average blood sugars are relatively steady with average on a 2 hourly basis between 114 and 148 ?Blood sugars are the lowest around 4-6 AM and also 4-6 PM ?HIGHEST blood sugars on an average are late at night after 10 PM ?HYPERGLYCEMIA is related to postprandial spikes that are occurring sporadically after lunch and dinner and also once in the morning ?On an average blood sugars are not rising excessively after meals and highest blood sugars are only occasionally over 180 ?Hypoglycemia has been minimal and only on 2 occasions 10 to 14 days ago early part of the night ? ?CGM  use % of time 93  ?2-week average/GV 128  ?Time in range      93%  ?% Time Above 180 6  ?% Time above 250   ?% Time Below 70 1  ? ?  ?PRE-MEAL Fasting Lunch Dinner Bedtime Overall  ?Glucose range:       ?Averages: 116      ? ?POST-MEAL PC Breakfast PC Lunch PC Dinner  ?Glucose range:     ?Averages:  142 148  ? ? ? ? ?CGM use % of time 29  ?2-week average/GV 144  ?Time in range       85%  ?% Time Above 180 13+2  ?% Time above 250   ?% Time Below 70 0  ? ?  ? ?Wt Readings from Last 3 Encounters:  ?06/17/21 131 lb (59.4 kg)  ?02/02/21 131 lb 3.2 oz (59.5 kg)  ?12/09/20 127 lb 3.2 oz (57.7 kg)  ? ? ? ?Glycemic  control: ? ?Lab Results  ?Component Value Date  ? HGBA1C 6.3 06/15/2021  ? HGBA1C 6.4 (A) 02/02/2021  ? HGBA1C 6.2 01/28/2021  ? ?Lab Results  ?Component Value Date  ? MICROALBUR 0.9 01/28/2021  ? LDLCALC 122 (H) 06/15/2021  ? CREATININE 0.61 06/15/2021  ? ?Lab Results  ?Component Value Date  ? FRUCTOSAMINE 248 09/05/2016  ? FRUCTOSAMINE 322 (H) 08/17/2015  ? ? ?Self-care: The diet that the patient has been following is: tries to limit portions, usually has some carbohydrate at every meal    ? Meals: 2 meals per day.  no breakfast, am snack, sandwich at lunch;  Some rice at meals Occasionally which she thinks tends to raise her sugar for couple of days         ? ?Dietician visit: Most recent:2011?                ? ?Compliance with the medical regimen: Good ?  ?Weight history: ? ?Wt Readings from Last 3 Encounters:  ?06/17/21 131 lb (59.4 kg)  ?02/02/21 131 lb 3.2 oz (59.5 kg)  ?12/09/20 127 lb 3.2 oz (57.7 kg)  ? ? ?Lab on 06/15/2021  ?Component Date Value Ref Range Status  ? Cholesterol 06/15/2021 196  0 - 200 mg/dL Final  ? ATP III Classification       Desirable:  < 200 mg/dL               Borderline High:  200 - 239 mg/dL          High:  > = 240 mg/dL  ? Triglycerides 06/15/2021 87.0  0.0 - 149.0 mg/dL Final  ? Normal:  <150 mg/dLBorderline High:  150 - 199 mg/dL  ? HDL 06/15/2021 56.20  >39.00 mg/dL Final  ? VLDL 06/15/2021 17.4  0.0 - 40.0 mg/dL Final  ? LDL Cholesterol 06/15/2021 122 (H)  0 - 99 mg/dL Final  ? Total CHOL/HDL Ratio 06/15/2021 3   Final  ?                Men          Women1/2 Average Risk     3.4          3.3Average Risk          5.0          4.42X Average Risk          9.6          7.13X Average Risk  15.0          11.0                      ? NonHDL 06/15/2021 139.82   Final  ? NOTE:  Non-HDL goal should be 30 mg/dL higher than patient's LDL goal (i.e. LDL goal of < 70 mg/dL, would have non-HDL goal of < 100 mg/dL)  ? Sodium 06/15/2021 143  135 - 145 mEq/L Final  ? Potassium 06/15/2021  4.4  3.5 - 5.1 mEq/L Final  ? Chloride 06/15/2021 106  96 - 112 mEq/L Final  ? CO2 06/15/2021 30  19 - 32 mEq/L Final  ? Glucose, Bld 06/15/2021 119 (H)  70 - 99 mg/dL Final  ? BUN 06/15/2021 23  6 - 23 mg/dL Final  ? Creatinine, Ser 06/15/2021 0.61  0.40 - 1.20 mg/dL Final  ? Total Bilirubin 06/15/2021 0.5  0.2 - 1.2 mg/dL Final  ? Alkaline Phosphatase 06/15/2021 74  39 - 117 U/L Final  ? AST 06/15/2021 18  0 - 37 U/L Final  ? ALT 06/15/2021 14  0 - 35 U/L Final  ? Total Protein 06/15/2021 6.9  6.0 - 8.3 g/dL Final  ? Albumin 06/15/2021 4.3  3.5 - 5.2 g/dL Final  ? GFR 06/15/2021 104.99  >60.00 mL/min Final  ? Calculated using the CKD-EPI Creatinine Equation (2021)  ? Calcium 06/15/2021 9.0  8.4 - 10.5 mg/dL Final  ? Hgb A1c MFr Bld 06/15/2021 6.3  4.6 - 6.5 % Final  ? Glycemic Control Guidelines for People with Diabetes:Non Diabetic:  <6%Goal of Therapy: <7%Additional Action Suggested:  >8%   ? ? ?Allergies as of 06/17/2021   ? ?   Reactions  ? Sulfa Antibiotics Nausea Only  ? Still able to take but makes nausea  ? ?  ? ?  ?Medication List  ?  ? ?  ? Accurate as of June 17, 2021 11:59 PM. If you have any questions, ask your nurse or doctor.  ?  ?  ? ?  ? ?STOP taking these medications   ? ?benzonatate 100 MG capsule ?Commonly known as: TESSALON ?Stopped by: Elayne Snare, MD ?  ? ?  ? ?TAKE these medications   ? ?BD Pen Needle Nano U/F 32G X 4 MM Misc ?Generic drug: Insulin Pen Needle ?USE 1 PEN NEEDLE TO INJECT INSULIN 4 TIMES DAILY ?  ?Calcium-Magnesium-Vitamin D 185-50-100 MG-MG-UNIT Caps ?Take by mouth. ?  ?cetirizine 10 MG tablet ?Commonly known as: ZYRTEC ?Take 20 mg by mouth daily. ?  ?Dexcom G6 Transmitter Misc ?1 each by Does not apply route See admin instructions. Use one transmitter once every 90 days to transmit blood sugar. ?  ?diphenhydrAMINE 25 MG tablet ?Commonly known as: BENADRYL ?Take 25 mg by mouth at bedtime as needed. ?  ?fluconazole 150 MG tablet ?Commonly known as: DIFLUCAN ?Take 1 tablet (150  mg total) by mouth daily. Take second tab 3days apart from first tab ?  ?fluvastatin 20 MG capsule ?Commonly known as: LESCOL ?Take 1 capsule (20 mg total) by mouth at bedtime. ?  ?FreeStyle Libre 3 Sensor Mi

## 2021-06-18 MED ORDER — FLUVASTATIN SODIUM 20 MG PO CAPS: 20.0000 mg | ORAL_CAPSULE | Freq: Every day | ORAL | 4 refills | Status: AC

## 2021-06-28 ENCOUNTER — Encounter: Payer: Self-pay | Admitting: Endocrinology

## 2021-08-04 LAB — HM DIABETES EYE EXAM

## 2021-08-20 ENCOUNTER — Other Ambulatory Visit: Payer: Self-pay

## 2021-08-20 ENCOUNTER — Other Ambulatory Visit: Payer: Self-pay | Admitting: Endocrinology

## 2021-08-20 ENCOUNTER — Telehealth: Payer: Self-pay | Admitting: Endocrinology

## 2021-08-20 MED ORDER — GLYXAMBI 25-5 MG PO TABS: 1.0000 | ORAL_TABLET | Freq: Every day | ORAL | 3 refills | Status: DC

## 2021-08-20 MED ORDER — TRESIBA FLEXTOUCH 100 UNIT/ML ~~LOC~~ SOPN: 15.0000 [IU] | PEN_INJECTOR | Freq: Every day | SUBCUTANEOUS | 1 refills | Status: DC

## 2021-08-20 NOTE — Telephone Encounter (Signed)
Patient called in states she will be out of insulin after tonight. States insurance will not cover lantus but will cover levemir. Also needs Glyxambi refilled and sent to [facility]. Both medications are pending and ready to send if you are ok with the levemir. Please advise

## 2021-08-24 ENCOUNTER — Other Ambulatory Visit: Payer: Self-pay | Admitting: Endocrinology

## 2021-10-12 ENCOUNTER — Other Ambulatory Visit: Payer: Self-pay | Admitting: Endocrinology

## 2021-10-20 DIAGNOSIS — Z1231 Encounter for screening mammogram for malignant neoplasm of breast: Secondary | ICD-10-CM | POA: Diagnosis not present

## 2021-10-27 DIAGNOSIS — Z01419 Encounter for gynecological examination (general) (routine) without abnormal findings: Secondary | ICD-10-CM | POA: Diagnosis not present

## 2021-10-27 DIAGNOSIS — Z6826 Body mass index (BMI) 26.0-26.9, adult: Secondary | ICD-10-CM | POA: Diagnosis not present

## 2021-11-02 ENCOUNTER — Other Ambulatory Visit (INDEPENDENT_AMBULATORY_CARE_PROVIDER_SITE_OTHER): Payer: Self-pay

## 2021-11-02 DIAGNOSIS — E559 Vitamin D deficiency, unspecified: Secondary | ICD-10-CM

## 2021-11-02 DIAGNOSIS — E119 Type 2 diabetes mellitus without complications: Secondary | ICD-10-CM

## 2021-11-02 DIAGNOSIS — E782 Mixed hyperlipidemia: Secondary | ICD-10-CM

## 2021-11-02 LAB — HEMOGLOBIN A1C: Hgb A1c MFr Bld: 6 % (ref 4.6–6.5)

## 2021-11-02 LAB — COMPREHENSIVE METABOLIC PANEL
ALT: 14 U/L (ref 0–35)
AST: 22 U/L (ref 0–37)
Albumin: 4 g/dL (ref 3.5–5.2)
Alkaline Phosphatase: 69 U/L (ref 39–117)
BUN: 23 mg/dL (ref 6–23)
CO2: 30 mEq/L (ref 19–32)
Calcium: 9.4 mg/dL (ref 8.4–10.5)
Chloride: 105 mEq/L (ref 96–112)
Creatinine, Ser: 0.64 mg/dL (ref 0.40–1.20)
GFR: 103.51 mL/min (ref >60.00)
Glucose, Bld: 108 mg/dL — ABNORMAL HIGH (ref 70–99)
Potassium: 4.2 mEq/L (ref 3.5–5.1)
Sodium: 142 mEq/L (ref 135–145)
Total Bilirubin: 0.4 mg/dL (ref 0.2–1.2)
Total Protein: 7.4 g/dL (ref 6.0–8.3)

## 2021-11-02 LAB — LIPID PANEL
Cholesterol: 161 mg/dL (ref 0–200)
HDL: 53.3 mg/dL (ref >39.00)
LDL Cholesterol: 94 mg/dL (ref 0–99)
NonHDL: 108.18
Total CHOL/HDL Ratio: 3
Triglycerides: 71 mg/dL (ref 0.0–149.0)
VLDL: 14.2 mg/dL (ref 0.0–40.0)

## 2021-11-02 LAB — VITAMIN D 25 HYDROXY (VIT D DEFICIENCY, FRACTURES): VITD: 86.13 ng/mL (ref 30.00–100.00)

## 2021-11-04 ENCOUNTER — Encounter: Payer: Self-pay | Admitting: Endocrinology

## 2021-11-04 ENCOUNTER — Ambulatory Visit (INDEPENDENT_AMBULATORY_CARE_PROVIDER_SITE_OTHER): Payer: BC Managed Care – PPO | Admitting: Endocrinology

## 2021-11-04 VITALS — BP 124/66 | HR 82 | Ht <= 58 in | Wt 128.0 lb

## 2021-11-04 DIAGNOSIS — E119 Type 2 diabetes mellitus without complications: Secondary | ICD-10-CM

## 2021-11-04 DIAGNOSIS — E559 Vitamin D deficiency, unspecified: Secondary | ICD-10-CM

## 2021-11-04 DIAGNOSIS — E782 Mixed hyperlipidemia: Secondary | ICD-10-CM

## 2021-11-04 DIAGNOSIS — R5383 Other fatigue: Secondary | ICD-10-CM

## 2021-11-04 MED ORDER — EMPAGLIFLOZIN 25 MG PO TABS: 25.0000 mg | ORAL_TABLET | Freq: Every day | ORAL | 2 refills | Status: DC

## 2021-11-04 NOTE — Progress Notes (Signed)
Patient ID: Anna Jennings, female   DOB: [birth year], 51 y.o.   MRN: [id number]   Reason for Appointment : Followup for Type 2 Diabetes  History of Present Illness          Diagnosis: Type 2 diabetes mellitus, date of diagnosis: 2007       Past history: She was initially diagnosed with a two-hour postprandial glucose of 218 in 08/2005 and baseline A1c of 5.7 Apparently she was not treated with oral hypoglycemic drugs initially and started on metformin in 2009. She did have diarrhea with the regular metformin but tolerated extended release preparation better with no side effects even on 2000 mg She thinks her blood sugars are fairly well controlled initially but a couple of years later with higher readings she was given Amaryl also. The dose of this was gradually increased Her A1c readings have been ranging from 6.5-7.2 in between 2011 and 2013 but no further followup done after 11/2011 She had been out of her medications prior to her initial visit here in 12/14 and her blood sugars were progressively higher.  She had side effects of  bloating and swelling with pioglitazone and her [name] was stopped Because of poor control and A1c of 8.5 in 3/15 she was started on Levemir insulin  Recent history:   Oral hypoglycemic drugs the patient is taking are: Glyxambi 25/5,  metformin ER 2 g qd  Insulin regimen: Tresiba 14 hs, Lyumjev 3-4 units as needed  A1c is better than usual at 6.0  Current management, blood sugar data from her freestyle libre 3 download and problems identified are:  Blood sugar control is improved She has gone up to 14 units on her basal insulin and previously was on Lantus which was not covered She is also not taking her mealtime insulin much except when she is eating more carbohydrates or heavy meals However still has occasional high readings periodically after meals or snacks with foods like potato chips She is still able to try and do some running or playing  tennis for exercise Her weight is about the same or lower She is asking about Glyxambi not being covered by insurance Also no significant hypoglycemia, not clear if some of the slightly low readings overnight are artifacts on her libre sensor Tolerating metformin well No change in renal function with continuing Jardiance          Side effects from medications have been: Diarrhea from regular metformin   Glucose monitoring:    Freestyle libre 3  Analysis of her CGM for the last 2 weeks  Blood sugars are somewhat variable at all times but generally staying within the target range She has more variability in her blood sugars in the evenings and may be relatively higher late in the evening compared to daytime Also has sporadic hyperglycemic episodes after lunch and dinner including late evening OVERNIGHT blood sugars are improving by about 3 to 4 AM and stable until morning with minimal dawn phenomenon No hypoglycemia at any time  On an average postprandial readings are not rising excessively compared to Premeal readings which are only higher before dinner as below  CGM use % of time   2-week average/GV 126  Time in range      92  %  % Time Above 180 7  % Time above 250   % Time Below 70 0     PRE-MEAL Fasting Lunch Dinner Bedtime Overall  Glucose range:  Averages: 100 109  149    POST-MEAL PC Breakfast PC Lunch PC Dinner  Glucose range:     Averages:  145 146   Previously:  CGM use % of time 93  2-week average/GV 128  Time in range      93%  % Time Above 180 6  % Time above 250   % Time Below 70 1     PRE-MEAL Fasting Lunch Dinner Bedtime Overall  Glucose range:       Averages: 116       POST-MEAL PC Breakfast PC Lunch PC Dinner  Glucose range:     Averages:  142 148     Wt Readings from Last 3 Encounters:  11/04/21 128 lb (58.1 kg)  06/17/21 131 lb (59.4 kg)  02/02/21 131 lb 3.2 oz (59.5 kg)     Glycemic control:  Lab Results  Component Value  Date   HGBA1C 6.0 11/02/2021   HGBA1C 6.3 06/15/2021   HGBA1C 6.4 (A) 02/02/2021   Lab Results  Component Value Date   MICROALBUR 0.9 01/28/2021   LDLCALC 94 11/02/2021   CREATININE 0.64 11/02/2021   Lab Results  Component Value Date   FRUCTOSAMINE 248 09/05/2016   FRUCTOSAMINE 322 (H) 08/17/2015    Self-care: The diet that the patient has been following is: tries to limit portions, usually has some carbohydrate at every meal     Meals: 2 meals per day.  no breakfast, am snack, sandwich at lunch;  Some rice at meals Occasionally which she thinks tends to raise her sugar for couple of days          Dietician visit: Most recent:2011?                 Compliance with the medical regimen: Good   Weight history:  Wt Readings from Last 3 Encounters:  11/04/21 128 lb (58.1 kg)  06/17/21 131 lb (59.4 kg)  02/02/21 131 lb 3.2 oz (59.5 kg)    Lab on 11/02/2021  Component Date Value Ref Range Status   VITD 11/02/2021 86.13  30.00 - 100.00 ng/mL Final   Cholesterol 11/02/2021 161  0 - 200 mg/dL Final   ATP III Classification       Desirable:  < 200 mg/dL               Borderline High:  200 - 239 mg/dL          High:  > = 240 mg/dL   Triglycerides 11/02/2021 71.0  0.0 - 149.0 mg/dL Final   Normal:  <150 mg/dLBorderline High:  150 - 199 mg/dL   HDL 11/02/2021 53.30  >39.00 mg/dL Final   VLDL 11/02/2021 14.2  0.0 - 40.0 mg/dL Final   LDL Cholesterol 11/02/2021 94  0 - 99 mg/dL Final   Total CHOL/HDL Ratio 11/02/2021 3   Final                  Men          Women1/2 Average Risk     3.4          3.3Average Risk          5.0          4.42X Average Risk          9.6          7.13X Average Risk          15.0  11.0                       NonHDL 11/02/2021 108.18   Final   NOTE:  Non-HDL goal should be 30 mg/dL higher than patient's LDL goal (i.e. LDL goal of < 70 mg/dL, would have non-HDL goal of < 100 mg/dL)   Sodium 11/02/2021 142  135 - 145 mEq/L Final   Potassium 11/02/2021 4.2   3.5 - 5.1 mEq/L Final   Chloride 11/02/2021 105  96 - 112 mEq/L Final   CO2 11/02/2021 30  19 - 32 mEq/L Final   Glucose, Bld 11/02/2021 108 (H)  70 - 99 mg/dL Final   BUN 11/02/2021 23  6 - 23 mg/dL Final   Creatinine, Ser 11/02/2021 0.64  0.40 - 1.20 mg/dL Final   Total Bilirubin 11/02/2021 0.4  0.2 - 1.2 mg/dL Final   Alkaline Phosphatase 11/02/2021 69  39 - 117 U/L Final   AST 11/02/2021 22  0 - 37 U/L Final   ALT 11/02/2021 14  0 - 35 U/L Final   Total Protein 11/02/2021 7.4  6.0 - 8.3 g/dL Final   Albumin 11/02/2021 4.0  3.5 - 5.2 g/dL Final   GFR 11/02/2021 103.51  >60.00 mL/min Final   Calculated using the CKD-EPI Creatinine Equation (2021)   Calcium 11/02/2021 9.4  8.4 - 10.5 mg/dL Final   Hgb A1c MFr Bld 11/02/2021 6.0  4.6 - 6.5 % Final   Glycemic Control Guidelines for People with Diabetes:Non Diabetic:  <6%Goal of Therapy: <7%Additional Action Suggested:  >8%     Allergies as of 11/04/2021       Reactions   Sulfa Antibiotics Nausea Only   Still able to take but makes nausea        Medication List        Accurate as of November 04, 2021 11:59 PM. If you have any questions, ask your nurse or doctor.          STOP taking these medications    Glyxambi 25-5 MG Tabs Generic drug: Empagliflozin-linaGLIPtin Stopped by: Elayne Snare, MD       TAKE these medications    ascorbic acid 500 MG tablet Commonly known as: VITAMIN C Take 500 mg by mouth daily.   BD Pen Needle Nano U/F 32G X 4 MM Misc Generic drug: Insulin Pen Needle USE 1 PEN NEEDLE TO INJECT INSULIN 4 TIMES DAILY   Calcium-Magnesium-Vitamin D 185-50-100 MG-MG-UNIT Caps Take by mouth.   cetirizine 10 MG tablet Commonly known as: ZYRTEC Take 20 mg by mouth daily.   diphenhydrAMINE 25 MG tablet Commonly known as: BENADRYL Take 25 mg by mouth at bedtime as needed.   empagliflozin 25 MG Tabs tablet Commonly known as: Jardiance Take 1 tablet (25 mg total) by mouth daily before  breakfast. Started by: Elayne Snare, MD   fluconazole 150 MG tablet Commonly known as: DIFLUCAN Take 1 tablet (150 mg total) by mouth daily. Take second tab 3days apart from first tab   fluvastatin 20 MG capsule Commonly known as: LESCOL Take 1 capsule (20 mg total) by mouth at bedtime.   FreeStyle Libre 3 Sensor Misc APPLY 1 SENSOR ON UPPER ARM EVERY 14 DAYS FOR CONTINUOUS GLUCOSE MONITORING   glucose blood test strip Use Onetouch verio test strips as instructed to check blood sugar three times daily.   levocetirizine 5 MG tablet Commonly known as: XYZAL Take 5 mg by mouth every evening.   Lyumjev KwikPen 100  UNIT/ML KwikPen Generic drug: Insulin Lispro-aabc Inject 5 units before each meal   metFORMIN 500 MG 24 hr tablet Commonly known as: GLUCOPHAGE-XR TAKE 2 TABLETS BY MOUTH  TWICE DAILY   OneTouch Delica Lancets 32K Misc 1 each by Does not apply route 3 (three) times daily. Use onetouch delica lancets to check blood sugar three times daily.   OneTouch Verio Flex System w/Device Kit USE AS INSTRUCTED TO CALIBRATE DEXCOM SYSTEM UP TO THREE TIMES A DAY   Tresiba FlexTouch 100 UNIT/ML FlexTouch Pen Generic drug: insulin degludec Inject 15 Units into the skin daily. Adjust as directed   Turmeric 500 MG Caps Take by mouth.   vitamin B-1 250 MG tablet Take 250 mg by mouth daily.   Vitamin D 50 MCG (2000 UT) tablet Take 2,000 Units by mouth daily.        Allergies:  Allergies  Allergen Reactions   Sulfa Antibiotics Nausea Only    Still able to take but makes nausea    Past Medical History:  Diagnosis Date   Diabetes mellitus without complication ([place])     Past Surgical History:  Procedure Laterality Date   APPENDECTOMY     CHOLECYSTECTOMY     PANCREAS SURGERY     Tumor head of pancreas, incompletely resected    Family History  Problem Relation Age of Onset   Hyperlipidemia Father    Hypertension Father    Coronary artery disease Father    Hearing  loss Father    Diabetes Brother    Heart disease Paternal Grandfather     Social History:  reports that she has never smoked. She has never used smokeless tobacco. She reports current alcohol use. She reports that she does not use drugs.    Review of Systems       Lipids: was on Lipitor  previously when she stopped it because of generalized aches and pains which were significant  She  does have a fairly significant family history of CAD including her father  She was taking pravastatin 20 mg and this was also stopped  in December 2017 because of muscle aching  She has been recommended taking her fluvastatin 20 mg at least 3 times a day but she has not been taking this as she does not like to take medication However surprisingly her LDL is improved  LDL is as follows:  Lab Results  Component Value Date   CHOL 161 11/02/2021   CHOL 196 06/15/2021   CHOL 203 (H) 01/28/2021   Lab Results  Component Value Date   HDL 53.30 11/02/2021   HDL 56.20 06/15/2021   HDL 65.40 01/28/2021   Lab Results  Component Value Date   LDLCALC 94 11/02/2021   LDLCALC 122 (H) 06/15/2021   LDLCALC 118 (H) 01/28/2021   Lab Results  Component Value Date   TRIG 71.0 11/02/2021   TRIG 87.0 06/15/2021   TRIG 100.0 01/28/2021   Lab Results  Component Value Date   CHOLHDL 3 11/02/2021   CHOLHDL 3 06/15/2021   CHOLHDL 3 01/28/2021   Lab Results  Component Value Date   LDLDIRECT 105.0 11/06/2019   LDLDIRECT 134.4 08/19/2013       She has had severe vitamin D deficiency diagnosed in 2009 with a level of 5.5. She is taking her recommended dose of vitamin D3, 4000 units with recently higher levels   Lab Results  Component Value Date   VD25OH 86.13 11/02/2021   VD25OH 34.32 11/06/2019   VD25OH 48.67  01/04/2019   VD25OH 51.61 03/02/2018    She had fatigue and her B12 level was low previously at 143 She has therapeutic level with B12 supplement However she still complains of fatigue and  sleepiness  Lab Results  Component Value Date   VITAMINB12 823 10/29/2020   She has had iron deficiency anemia with symptoms of fatigue This has been managed elsewhere  TSH checked by gynecologist was normal in 8/22  Lab Results  Component Value Date   TSH 2.48 01/07/2020   '  Physical Examination:  BP 124/66   Pulse 82   Ht _0  (1.473 m)   Wt 128 lb (58.1 kg)   SpO2 99%   BMI 26.75 kg/m      ASSESSMENT/PLAN:   Diabetes type 2 nonobese  See history of present illness for detailed discussion of current management, blood sugar patterns and problems identified  She has an A1c of 6.0  She is on Metformin and Glyxambi maximum doses She is on mostly basal insulin with 14 units [place]  She is going off her diet her postprandial readings are still controlled Also benefiting from regular exercise Blood sugar patterns and [employer] were reviewed  Recommendations: No change in medications or insulin She can stop Glyxambi as this is not covered and stay with Jardiance alone, she will let us know if blood sugars tend to start going higher  LIPIDS: Even though she has not taken her fluvastatin regularly her LDL is better Discussed needing to take this 2 to 3 days a week for cardiovascular risk reduction and she will try to remember to do this  Vitamin D supplementation: She can reduce her supplement to 2000 instead of 4000 units of vitamin D3  Fatigue: She needs to talk to her PCP about evaluation   Patient Instructions  Vitamin D3, 2000 daily      Elayne Snare 11/05/2021, 3:59 PM

## 2021-11-04 NOTE — Patient Instructions (Signed)
Vitamin D3, 2000 daily

## 2021-11-10 ENCOUNTER — Encounter: Payer: Self-pay | Admitting: Endocrinology

## 2021-11-11 ENCOUNTER — Other Ambulatory Visit: Payer: Self-pay | Admitting: Endocrinology

## 2022-01-05 ENCOUNTER — Encounter: Payer: Self-pay | Admitting: Endocrinology

## 2022-01-27 DIAGNOSIS — M2011 Hallux valgus (acquired), right foot: Secondary | ICD-10-CM | POA: Diagnosis not present

## 2022-01-27 DIAGNOSIS — M7751 Other enthesopathy of right foot: Secondary | ICD-10-CM | POA: Diagnosis not present

## 2022-02-04 ENCOUNTER — Encounter: Payer: Self-pay | Admitting: Endocrinology

## 2022-02-08 ENCOUNTER — Other Ambulatory Visit (HOSPITAL_COMMUNITY): Payer: Self-pay

## 2022-02-09 ENCOUNTER — Other Ambulatory Visit: Payer: Self-pay | Admitting: Endocrinology

## 2022-02-15 ENCOUNTER — Telehealth: Payer: Self-pay | Admitting: Endocrinology

## 2022-02-15 DIAGNOSIS — M2011 Hallux valgus (acquired), right foot: Secondary | ICD-10-CM | POA: Diagnosis not present

## 2022-02-15 DIAGNOSIS — E119 Type 2 diabetes mellitus without complications: Secondary | ICD-10-CM

## 2022-02-15 MED ORDER — EMPAGLIFLOZIN 25 MG PO TABS: 25.0000 mg | ORAL_TABLET | Freq: Every day | ORAL | 3 refills | Status: DC

## 2022-02-15 NOTE — Telephone Encounter (Signed)
Rx sent to pharmacy   

## 2022-02-15 NOTE — Telephone Encounter (Signed)
MEDICATION:  Jardiance JARDIANCE 25 MG TABS tablet  PHARMACY:    [facility] # [place], [place] (Ph: [phone])    HAS THE PATIENT CONTACTED THEIR PHARMACY?  Yes  IS THIS A 90 DAY SUPPLY : Yes  IS PATIENT OUT OF MEDICATION: No  IF NOT; HOW MUCH IS LEFT: About 25 days  LAST APPOINTMENT DATE: '@9'$ /7/23  NEXT APPOINTMENT DATE:'@2'$ /01/2023  DO WE HAVE YOUR PERMISSION TO LEAVE A DETAILED MESSAGE?: Yes  OTHER COMMENTS: Patient wants to know why she is not getting a 90 day supply and she has to do a prior authorization each time.   **Let patient know to contact pharmacy at the end of the day to make sure medication is ready. **  ** Please notify patient to allow 48-72 hours to process**  **Encourage patient to contact the pharmacy for refills or they can request refills through [facility]**

## 2022-03-08 ENCOUNTER — Other Ambulatory Visit: Payer: BC Managed Care – PPO

## 2022-03-10 ENCOUNTER — Ambulatory Visit: Payer: BC Managed Care – PPO | Admitting: Endocrinology

## 2022-03-28 ENCOUNTER — Other Ambulatory Visit: Payer: Self-pay | Admitting: Endocrinology

## 2022-04-11 ENCOUNTER — Other Ambulatory Visit (INDEPENDENT_AMBULATORY_CARE_PROVIDER_SITE_OTHER): Payer: BC Managed Care – PPO

## 2022-04-11 DIAGNOSIS — E119 Type 2 diabetes mellitus without complications: Secondary | ICD-10-CM | POA: Diagnosis not present

## 2022-04-11 DIAGNOSIS — R5383 Other fatigue: Secondary | ICD-10-CM

## 2022-04-11 DIAGNOSIS — E782 Mixed hyperlipidemia: Secondary | ICD-10-CM

## 2022-04-11 LAB — MICROALBUMIN / CREATININE URINE RATIO
Creatinine,U: 144.6 mg/dL
Microalb Creat Ratio: 0.9 mg/g (ref 0.0–30.0)
Microalb, Ur: 1.3 mg/dL (ref 0.0–1.9)

## 2022-04-11 LAB — LIPID PANEL
Cholesterol: 215 mg/dL — ABNORMAL HIGH (ref 0–200)
HDL: 60.5 mg/dL (ref >39.00)
LDL Cholesterol: 135 mg/dL — ABNORMAL HIGH (ref 0–99)
NonHDL: 154.95
Total CHOL/HDL Ratio: 4
Triglycerides: 98 mg/dL (ref 0.0–149.0)
VLDL: 19.6 mg/dL (ref 0.0–40.0)

## 2022-04-11 LAB — TSH: TSH: 4.5 u[IU]/mL (ref 0.35–5.50)

## 2022-04-11 LAB — COMPREHENSIVE METABOLIC PANEL
ALT: 13 U/L (ref 0–35)
AST: 19 U/L (ref 0–37)
Albumin: 4.3 g/dL (ref 3.5–5.2)
Alkaline Phosphatase: 78 U/L (ref 39–117)
BUN: 19 mg/dL (ref 6–23)
CO2: 28 mEq/L (ref 19–32)
Calcium: 9.6 mg/dL (ref 8.4–10.5)
Chloride: 101 mEq/L (ref 96–112)
Creatinine, Ser: 0.71 mg/dL (ref 0.40–1.20)
GFR: 99.28 mL/min (ref >60.00)
Glucose, Bld: 91 mg/dL (ref 70–99)
Potassium: 3.6 mEq/L (ref 3.5–5.1)
Sodium: 138 mEq/L (ref 135–145)
Total Bilirubin: 0.4 mg/dL (ref 0.2–1.2)
Total Protein: 7.3 g/dL (ref 6.0–8.3)

## 2022-04-11 LAB — T4, FREE: Free T4: 0.96 ng/dL (ref 0.60–1.60)

## 2022-04-11 LAB — HEMOGLOBIN A1C: Hgb A1c MFr Bld: 6.9 % — ABNORMAL HIGH (ref 4.6–6.5)

## 2022-04-13 ENCOUNTER — Other Ambulatory Visit: Payer: Self-pay

## 2022-04-13 ENCOUNTER — Ambulatory Visit (INDEPENDENT_AMBULATORY_CARE_PROVIDER_SITE_OTHER): Payer: BC Managed Care – PPO | Admitting: Endocrinology

## 2022-04-13 VITALS — BP 118/80 | HR 81 | Ht <= 58 in | Wt 134.8 lb

## 2022-04-13 DIAGNOSIS — E1165 Type 2 diabetes mellitus with hyperglycemia: Secondary | ICD-10-CM | POA: Diagnosis not present

## 2022-04-13 DIAGNOSIS — E559 Vitamin D deficiency, unspecified: Secondary | ICD-10-CM | POA: Diagnosis not present

## 2022-04-13 DIAGNOSIS — E782 Mixed hyperlipidemia: Secondary | ICD-10-CM | POA: Diagnosis not present

## 2022-04-13 DIAGNOSIS — E119 Type 2 diabetes mellitus without complications: Secondary | ICD-10-CM

## 2022-04-13 DIAGNOSIS — Z794 Long term (current) use of insulin: Secondary | ICD-10-CM

## 2022-04-13 MED ORDER — TRESIBA FLEXTOUCH 100 UNIT/ML ~~LOC~~ SOPN: 14.0000 [IU] | PEN_INJECTOR | Freq: Every day | SUBCUTANEOUS | 3 refills | Status: DC

## 2022-04-13 MED ORDER — EMPAGLIFLOZIN 25 MG PO TABS: 25.0000 mg | ORAL_TABLET | Freq: Every day | ORAL | 3 refills | Status: DC

## 2022-04-13 MED ORDER — METFORMIN HCL ER 500 MG PO TB24: 1000.0000 mg | ORAL_TABLET | Freq: Two times a day (BID) | ORAL | 2 refills | Status: DC

## 2022-04-13 MED ORDER — FREESTYLE LIBRE 3 SENSOR MISC: 3 refills | Status: DC

## 2022-04-13 MED ORDER — LYUMJEV KWIKPEN 100 UNIT/ML ~~LOC~~ SOPN: PEN_INJECTOR | SUBCUTANEOUS | 3 refills | Status: DC

## 2022-04-13 NOTE — Progress Notes (Signed)
Patient ID: Anna Jennings, female   DOB: [birth year], 52 y.o.   MRN: [id number]   Reason for Appointment : Followup for Type 2 Diabetes  History of Present Illness          Diagnosis: Type 2 diabetes mellitus, date of diagnosis: 2007       Past history: She was initially diagnosed with a two-hour postprandial glucose of 218 in 08/2005 and baseline A1c of 5.7 Apparently she was not treated with oral hypoglycemic drugs initially and started on metformin in 2009. She did have diarrhea with the regular metformin but tolerated extended release preparation better with no side effects even on 2000 mg She thinks her blood sugars are fairly well controlled initially but a couple of years later with higher readings she was given Amaryl also. The dose of this was gradually increased Her A1c readings have been ranging from 6.5-7.2 in between 2011 and 2013 but no further followup done after 11/2011 She had been out of her medications prior to her initial visit here in 12/14 and her blood sugars were progressively higher.  She had side effects of  bloating and swelling with pioglitazone and her Anna Jennings was stopped Because of poor control and A1c of 8.5 in 3/15 she was started on Levemir insulin  Recent history:   Oral hypoglycemic drugs the patient is taking are: Glyxambi 25/5,  metformin ER 2 g qd  Insulin regimen: Tresiba 14 hs, Lyumjev 3-5 units as needed  A1c is higher at 6.9 compared to 6  Current management, blood sugar data from her freestyle libre 3 download and problems identified are:  Blood sugar control is is not as good likely from postprandial hyperglycemia She has had more stress and has not been seen in follow-up for the last 5 months She says she tends to do a lot of stress eating including carbohydrates and blood sugars will be higher She is generally not able to remember to take her insulin when she is going off her diet She has frequent blood sugars over 200 especially  after lunch Fasting blood sugars are still controlled with 14 units on her Tresiba insulin as before Blood sugars after dinner are not consistently high Tolerating 2000 mg metformin well No change in renal function with taking 25 mg Jardiance         Side effects from medications have been: Diarrhea from regular metformin   Glucose monitoring:    Freestyle libre 3  Analysis of her CGM for the last 2 weeks  Blood sugars overnight are averaging about 160 and then progressively improving until 8 AM and no hypoglycemia Pre-meal blood sugars averaging around 120-130 at dinnertime and slightly better in the morning Postprandial readings are variably high after lunch or midday which is her first meal usually and highest readings are in the lower 300 range  Blood sugars fluctuate in the evenings after meals and on an average not rising as much No daytime hypoglycemia  CGM use % of time   2-week average/GV 138/33  Time in range      84  %  % Time Above 180 13+3  % Time above 250   % Time Below 70      PRE-MEAL Fasting Lunch Dinner Bedtime Overall  Glucose range:       Averages: 112   168    POST-MEAL PC Breakfast PC Lunch PC Dinner  Glucose range:     Averages:  163    Previously  CGM  use % of time   2-week average/GV 126  Time in range      92  %  % Time Above 180 7  % Time above 250   % Time Below 70 0     PRE-MEAL Fasting Lunch Dinner Bedtime Overall  Glucose range:       Averages: 100 109  149    POST-MEAL PC Breakfast PC Lunch PC Dinner  Glucose range:     Averages:  145 146      Wt Readings from Last 3 Encounters:  04/13/22 134 lb 12.8 oz (61.1 kg)  11/04/21 128 lb (58.1 kg)  06/17/21 131 lb (59.4 kg)     Glycemic control:  Lab Results  Component Value Date   HGBA1C 6.9 (H) 04/11/2022   HGBA1C 6.0 11/02/2021   HGBA1C 6.3 06/15/2021   Lab Results  Component Value Date   MICROALBUR 1.3 04/11/2022   LDLCALC 135 (H) 04/11/2022   CREATININE 0.71  04/11/2022   Lab Results  Component Value Date   FRUCTOSAMINE 248 09/05/2016   FRUCTOSAMINE 322 (H) 08/17/2015    Self-care: The diet that the patient has been following is: tries to limit portions, usually has some carbohydrate at every meal     Meals: 2 meals per day.  no breakfast, am snack, sandwich at lunch;  Some rice at meals Occasionally which she thinks tends to raise her sugar for couple of days          Dietician visit: Most recent:2011?                 Compliance with the medical regimen: Good   Weight history:  Wt Readings from Last 3 Encounters:  04/13/22 134 lb 12.8 oz (61.1 kg)  11/04/21 128 lb (58.1 kg)  06/17/21 131 lb (59.4 kg)    Lab on 04/11/2022  Component Date Value Ref Range Status   Hgb A1c MFr Bld 04/11/2022 6.9 (H)  4.6 - 6.5 % Final   Glycemic Control Guidelines for People with Diabetes:Non Diabetic:  <6%Goal of Therapy: <7%Additional Action Suggested:  >8%    Sodium 04/11/2022 138  135 - 145 mEq/L Final   Potassium 04/11/2022 3.6  3.5 - 5.1 mEq/L Final   Chloride 04/11/2022 101  96 - 112 mEq/L Final   CO2 04/11/2022 28  19 - 32 mEq/L Final   Glucose, Bld 04/11/2022 91  70 - 99 mg/dL Final   BUN 04/11/2022 19  6 - 23 mg/dL Final   Creatinine, Ser 04/11/2022 0.71  0.40 - 1.20 mg/dL Final   Total Bilirubin 04/11/2022 0.4  0.2 - 1.2 mg/dL Final   Alkaline Phosphatase 04/11/2022 78  39 - 117 U/L Final   AST 04/11/2022 19  0 - 37 U/L Final   ALT 04/11/2022 13  0 - 35 U/L Final   Total Protein 04/11/2022 7.3  6.0 - 8.3 g/dL Final   Albumin 04/11/2022 4.3  3.5 - 5.2 g/dL Final   GFR 04/11/2022 99.28  >60.00 mL/min Final   Calculated using the CKD-EPI Creatinine Equation (2021)   Calcium 04/11/2022 9.6  8.4 - 10.5 mg/dL Final   Cholesterol 04/11/2022 215 (H)  0 - 200 mg/dL Final   ATP III Classification       Desirable:  < 200 mg/dL               Borderline High:  200 - 239 mg/dL          High:  > =  240 mg/dL   Triglycerides 04/11/2022 98.0  0.0 -  149.0 mg/dL Final   Normal:  <150 mg/dLBorderline High:  150 - 199 mg/dL   HDL 04/11/2022 60.50  >39.00 mg/dL Final   VLDL 04/11/2022 19.6  0.0 - 40.0 mg/dL Final   LDL Cholesterol 04/11/2022 135 (H)  0 - 99 mg/dL Final   Total CHOL/HDL Ratio 04/11/2022 4   Final                  Men          Women1/2 Average Risk     3.4          3.3Average Risk          5.0          4.42X Average Risk          9.6          7.13X Average Risk          15.0          11.0                       NonHDL 04/11/2022 154.95   Final   NOTE:  Non-HDL goal should be 30 mg/dL higher than patient's LDL goal (i.e. LDL goal of < 70 mg/dL, would have non-HDL goal of < 100 mg/dL)   Microalb, Ur 04/11/2022 1.3  0.0 - 1.9 mg/dL Final   Creatinine,U 04/11/2022 144.6  mg/dL Final   Microalb Creat Ratio 04/11/2022 0.9  0.0 - 30.0 mg/g Final   TSH 04/11/2022 4.50  0.35 - 5.50 uIU/mL Final   Free T4 04/11/2022 0.96  0.60 - 1.60 ng/dL Final   Comment: Specimens from patients who are undergoing biotin therapy and /or ingesting biotin supplements may contain high levels of biotin.  The higher biotin concentration in these specimens interferes with this Free T4 assay.  Specimens that contain high levels  of biotin may cause false high results for this Free T4 assay.  Please interpret results in light of the total clinical presentation of the patient.      Allergies as of 04/13/2022       Reactions   Sulfa Antibiotics Nausea Only   Still able to take but makes nausea        Medication List        Accurate as of April 13, 2022 11:14 AM. If you have any questions, ask your nurse or doctor.          ascorbic acid 500 MG tablet Commonly known as: VITAMIN C Take 500 mg by mouth daily.   Calcium-Magnesium-Vitamin D 185-50-100 MG-MG-UNIT Caps Take by mouth.   cetirizine 10 MG tablet Commonly known as: ZYRTEC Take 20 mg by mouth daily.   diphenhydrAMINE 25 MG tablet Commonly known as: BENADRYL Take 25 mg by mouth at  bedtime as needed.   empagliflozin 25 MG Tabs tablet Commonly known as: Jardiance Take 1 tablet (25 mg total) by mouth daily before breakfast.   fluconazole 150 MG tablet Commonly known as: DIFLUCAN Take 1 tablet (150 mg total) by mouth daily. Take second tab 3days apart from first tab   fluvastatin 20 MG capsule Commonly known as: LESCOL Take 1 capsule (20 mg total) by mouth at bedtime.   FreeStyle Libre 3 Sensor Misc APPLY 1 SENSOR ON UPPER ARM EVERY 14 DAYS FOR CONTINUOUS GLUCOSE MONITORING   glucose blood test strip Use Onetouch verio test strips as instructed  to check blood sugar three times daily.   levocetirizine 5 MG tablet Commonly known as: XYZAL Take 5 mg by mouth every evening.   Lyumjev KwikPen 100 UNIT/ML KwikPen Generic drug: Insulin Lispro-aabc Inject 5 units before each meal   metFORMIN 500 MG 24 hr tablet Commonly known as: GLUCOPHAGE-XR take 2 tablets by mouth 2 times daily   OneTouch Delica Lancets 99991111 Misc 1 each by Does not apply route 3 (three) times daily. Use onetouch delica lancets to check blood sugar three times daily.   OneTouch Verio Flex System w/Device Kit USE AS INSTRUCTED TO CALIBRATE DEXCOM SYSTEM UP TO THREE TIMES A DAY   Tresiba FlexTouch 100 UNIT/ML FlexTouch Pen Generic drug: insulin degludec Inject 14 Units into the skin daily. Adjust as directed   Turmeric 500 MG Caps Take by mouth.   Unifine Pentips Plus 32G X 4 MM Misc Generic drug: Insulin Pen Needle USE ONE PEN NEEDLE TO INJECT INSULIN FOUR TIMES DAILY   vitamin B-1 250 MG tablet Take 250 mg by mouth daily.   Vitamin D 50 MCG (2000 UT) tablet Take 2,000 Units by mouth daily.        Allergies:  Allergies  Allergen Reactions   Sulfa Antibiotics Nausea Only    Still able to take but makes nausea    Past Medical History:  Diagnosis Date   Diabetes mellitus without complication ([place])     Past Surgical History:  Procedure Laterality Date   APPENDECTOMY      CHOLECYSTECTOMY     PANCREAS SURGERY     Tumor head of pancreas, incompletely resected    Family History  Problem Relation Age of Onset   Hyperlipidemia Father    Hypertension Father    Coronary artery disease Father    Hearing loss Father    Diabetes Brother    Heart disease Paternal Grandfather     Social History:  reports that she has never smoked. She has never used smokeless tobacco. She reports current alcohol use. She reports that she does not use drugs.    Review of Systems       Lipids: was on Lipitor  previously when she stopped it because of generalized aches and pains which were significant  She  does have a fairly significant family history of CAD including her father  She was taking pravastatin 20 mg and this was also stopped  in December 2017 because of muscle aching  Was recommended taking her fluvastatin 20 mg at least 3 times a day but she refuses to consider any statin drugs  She is not consistent with her diet but is not usually eating a lot of high-fat dairy products or meat   LDL is as follows:  Lab Results  Component Value Date   CHOL 215 (H) 04/11/2022   CHOL 161 11/02/2021   CHOL 196 06/15/2021   Lab Results  Component Value Date   HDL 60.50 04/11/2022   HDL 53.30 11/02/2021   HDL 56.20 06/15/2021   Lab Results  Component Value Date   LDLCALC 135 (H) 04/11/2022   LDLCALC 94 11/02/2021   LDLCALC 122 (H) 06/15/2021   Lab Results  Component Value Date   TRIG 98.0 04/11/2022   TRIG 71.0 11/02/2021   TRIG 87.0 06/15/2021   Lab Results  Component Value Date   CHOLHDL 4 04/11/2022   CHOLHDL 3 11/02/2021   CHOLHDL 3 06/15/2021   Lab Results  Component Value Date   LDLDIRECT 105.0 11/06/2019  LDLDIRECT 134.4 08/19/2013       She has had severe vitamin D deficiency diagnosed in 2009 with a level of 5.5. She is taking her recommended dose of vitamin D3, 4000 units with recently higher levels   Lab Results  Component Value  Date   VD25OH 86.13 11/02/2021   VD25OH 34.32 11/06/2019   VD25OH 48.67 01/04/2019   VD25OH 51.61 03/02/2018    She had fatigue and her B12 level was low previously at 143 She has therapeutic level with B12 supplement  Lab Results  Component Value Date   VITAMINB12 823 10/29/2020    TSH checked for symptoms of fatigue  Lab Results  Component Value Date   TSH 4.50 04/11/2022   '  Physical Examination:  BP 118/80 (BP Location: Left Arm, Patient Position: Sitting, Cuff Size: Normal)   Pulse 81   Ht 4' 10"$  (1.473 m)   Wt 134 lb 12.8 oz (61.1 kg)   SpO2 97%   BMI 28.17 kg/m      ASSESSMENT/PLAN:   Diabetes type 2 nonobese  See history of present illness for detailed discussion of current management, blood sugar patterns and problems identified  She has an A1c of 6.9  She is on Metformin and Jardiance along with [place] 14 units She will take Lyumjev also for higher carbohydrate meals   Blood sugar control is not as good because of inconsistent diet compared to her last visit and also has gained a little weight  As before when she is going off her diet her postprandial readings go up significantly especially lunchtime Currently also having more stressed and no exercise Blood sugar patterns from the [place] sensor download were reviewed  Recommendations: No change in current regimen but needs to try and take Lyumjev more consistently when she is eating large carbohydrate meals or snacks No change in basal insulin She may do better with Ozempic or Mounjaro but she is concerned about possible side effects and does not want to do this now  LIPIDS: Currently she is still refusing to consider medications and think she can do better on diet, discussed sources of saturated fat  Total visit time for evaluation and management and counseling = 30 minutes  There are no Patient Instructions on file for this visit.      Elayne Snare 04/13/2022, 11:14 AM

## 2022-04-13 NOTE — Patient Instructions (Signed)
Walk daily

## 2022-04-15 ENCOUNTER — Encounter: Payer: Self-pay | Admitting: Endocrinology

## 2022-07-11 ENCOUNTER — Other Ambulatory Visit: Payer: BC Managed Care – PPO

## 2022-07-13 ENCOUNTER — Ambulatory Visit: Payer: BC Managed Care – PPO | Admitting: Endocrinology

## 2022-08-06 ENCOUNTER — Other Ambulatory Visit: Payer: Self-pay | Admitting: Endocrinology

## 2022-08-06 DIAGNOSIS — E119 Type 2 diabetes mellitus without complications: Secondary | ICD-10-CM

## 2022-08-07 ENCOUNTER — Encounter: Payer: Self-pay | Admitting: Endocrinology

## 2022-08-08 ENCOUNTER — Other Ambulatory Visit: Payer: Self-pay | Admitting: Endocrinology

## 2022-08-08 DIAGNOSIS — R5383 Other fatigue: Secondary | ICD-10-CM

## 2022-08-10 ENCOUNTER — Other Ambulatory Visit: Payer: BC Managed Care – PPO

## 2022-08-15 ENCOUNTER — Other Ambulatory Visit (INDEPENDENT_AMBULATORY_CARE_PROVIDER_SITE_OTHER): Payer: BC Managed Care – PPO

## 2022-08-15 DIAGNOSIS — Z794 Long term (current) use of insulin: Secondary | ICD-10-CM | POA: Diagnosis not present

## 2022-08-15 DIAGNOSIS — R5383 Other fatigue: Secondary | ICD-10-CM | POA: Diagnosis not present

## 2022-08-15 DIAGNOSIS — E559 Vitamin D deficiency, unspecified: Secondary | ICD-10-CM

## 2022-08-15 DIAGNOSIS — E1165 Type 2 diabetes mellitus with hyperglycemia: Secondary | ICD-10-CM

## 2022-08-15 DIAGNOSIS — E782 Mixed hyperlipidemia: Secondary | ICD-10-CM | POA: Diagnosis not present

## 2022-08-15 LAB — LIPID PANEL
Cholesterol: 193 mg/dL (ref 0–200)
HDL: 54.2 mg/dL (ref >39.00)
LDL Cholesterol: 117 mg/dL — ABNORMAL HIGH (ref 0–99)
NonHDL: 139.2
Total CHOL/HDL Ratio: 4
Triglycerides: 112 mg/dL (ref 0.0–149.0)
VLDL: 22.4 mg/dL (ref 0.0–40.0)

## 2022-08-15 LAB — COMPREHENSIVE METABOLIC PANEL
ALT: 14 U/L (ref 0–35)
AST: 19 U/L (ref 0–37)
Albumin: 4 g/dL (ref 3.5–5.2)
Alkaline Phosphatase: 76 U/L (ref 39–117)
BUN: 15 mg/dL (ref 6–23)
CO2: 29 mEq/L (ref 19–32)
Calcium: 8.8 mg/dL (ref 8.4–10.5)
Chloride: 104 mEq/L (ref 96–112)
Creatinine, Ser: 0.57 mg/dL (ref 0.40–1.20)
GFR: 105.85 mL/min (ref >60.00)
Glucose, Bld: 105 mg/dL — ABNORMAL HIGH (ref 70–99)
Potassium: 4.1 mEq/L (ref 3.5–5.1)
Sodium: 140 mEq/L (ref 135–145)
Total Bilirubin: 0.5 mg/dL (ref 0.2–1.2)
Total Protein: 7 g/dL (ref 6.0–8.3)

## 2022-08-15 LAB — TSH: TSH: 1.13 u[IU]/mL (ref 0.35–5.50)

## 2022-08-15 LAB — HEMOGLOBIN A1C: Hgb A1c MFr Bld: 6.3 % (ref 4.6–6.5)

## 2022-08-15 LAB — T4, FREE: Free T4: 0.88 ng/dL (ref 0.60–1.60)

## 2022-08-15 LAB — VITAMIN D 25 HYDROXY (VIT D DEFICIENCY, FRACTURES): VITD: 37.27 ng/mL (ref 30.00–100.00)

## 2022-08-17 ENCOUNTER — Encounter: Payer: Self-pay | Admitting: Endocrinology

## 2022-08-17 ENCOUNTER — Ambulatory Visit (INDEPENDENT_AMBULATORY_CARE_PROVIDER_SITE_OTHER): Payer: BC Managed Care – PPO | Admitting: Endocrinology

## 2022-08-17 VITALS — BP 112/70 | HR 80 | Ht <= 58 in | Wt 130.2 lb

## 2022-08-17 DIAGNOSIS — R5383 Other fatigue: Secondary | ICD-10-CM

## 2022-08-17 DIAGNOSIS — E782 Mixed hyperlipidemia: Secondary | ICD-10-CM

## 2022-08-17 DIAGNOSIS — E559 Vitamin D deficiency, unspecified: Secondary | ICD-10-CM | POA: Diagnosis not present

## 2022-08-17 DIAGNOSIS — Z7984 Long term (current) use of oral hypoglycemic drugs: Secondary | ICD-10-CM

## 2022-08-17 DIAGNOSIS — Z794 Long term (current) use of insulin: Secondary | ICD-10-CM

## 2022-08-17 DIAGNOSIS — E119 Type 2 diabetes mellitus without complications: Secondary | ICD-10-CM

## 2022-08-17 NOTE — Patient Instructions (Signed)
Low fat diet

## 2022-08-17 NOTE — Progress Notes (Signed)
Patient ID: Anna Jennings, female   DOB: 04-13-1970, 52 y.o.   MRN: [id number]   Reason for Appointment : Followup for Type 2 Diabetes  History of Present Illness          Diagnosis: Type 2 diabetes mellitus, date of diagnosis: 2007       Past history: She was initially diagnosed with a two-hour postprandial glucose of 218 in 08/2005 and baseline A1c of 5.7 Apparently she was not treated with oral hypoglycemic drugs initially and started on metformin in 2009. She did have diarrhea with the regular metformin but tolerated extended release preparation better with no side effects even on 2000 mg She thinks her blood sugars are fairly well controlled initially but a couple of years later with higher readings she was given Amaryl also. The dose of this was gradually increased Her A1c readings have been ranging from 6.5-7.2 in between 2011 and 2013 but no further followup done after 11/2011 She had been out of her medications prior to her initial visit here in 12/14 and her blood sugars were progressively higher.  She had side effects of  bloating and swelling with pioglitazone and her Loney Loh was stopped Because of poor control and A1c of 8.5 in 3/15 she was started on Levemir insulin  Recent history:   Oral hypoglycemic drugs the patient is taking are: JARDIANCE 25 mg daily,  metformin ER 2 g qd  Insulin regimen: Tresiba 14 hs, Lyumjev 3-5 units as needed  Her A1c has improved to 6.3 from 6.9  Current management, blood sugar data from her freestyle libre 3 download and problems identified are:  Blood sugar control generally improving She thinks this is from overall getting her meals a little better and less stressed However even though she has periodic blood sugar spikes after meals she is only taking Lyumjev 3-4 times a week  Last night with eating a cheese steak sandwich blood sugar was around 200 but she did not take any insulin Sometimes she takes insulin after she starts  eating and she thinks this is not as efficient as before the meal Overnight blood sugars are generally fairly good with near normal readings with current dose of [place] She thinks that 1 episode of hypoglycemia was probably related to lying on the sensor She has not completed her sensor readings with a fingerstick However lab fasting glucose was 105 late morning No side effects from Jardiance        Side effects from medications have been: Diarrhea from regular metformin   Glucose monitoring:    Freestyle libre 3  Analysis of her CGM for the last 4 weeks  Blood sugars overnight are somewhat variable over the last month but on an average excellent with the lowest readings around 6-8 AM.  Episode of hypoglycemia present on 1 night only Blood sugars are generally variable the rest of the day especially after midday till midnight and highest late evening  On an average postprandial readings are well within the target of under 180 with highest readings after dinner Blood sugars show periodic spikes above 180 at all different times during the day without consistent pattern Mild hypoglycemia has occurred only on about 2 occasions midday or late afternoon Heart time in range is similar to her last visit      Prior   CGM use % of time   2-week average/GV 138/33  Time in range      84  %  % Time Above 180  13+3  % Time above 250   % Time Below 70      PRE-MEAL Fasting Lunch Dinner Bedtime Overall  Glucose range:       Averages: 112   168    POST-MEAL PC Breakfast PC Lunch PC Dinner  Glucose range:     Averages:  163      Wt Readings from Last 3 Encounters:  08/17/22 130 lb 3.2 oz (59.1 kg)  04/13/22 134 lb 12.8 oz (61.1 kg)  11/04/21 128 lb (58.1 kg)    Glycemic control:  Lab Results  Component Value Date   HGBA1C 6.3 08/15/2022   HGBA1C 6.9 (H) 04/11/2022   HGBA1C 6.0 11/02/2021   Lab Results  Component Value Date   MICROALBUR 1.3 04/11/2022   LDLCALC 117 (H)  08/15/2022   CREATININE 0.57 08/15/2022   Lab Results  Component Value Date   FRUCTOSAMINE 248 09/05/2016   FRUCTOSAMINE 322 (H) 08/17/2015    Self-care: The diet that the patient has been following is: tries to limit portions, usually has some carbohydrate at every meal     Meals: 2 meals per day.  no breakfast, am snack, sandwich at lunch;  Some rice at meals Occasionally which she thinks tends to raise her sugar for couple of days          Dietician visit: Most recent:2011?                 Compliance with the medical regimen: Good   Weight history:  Wt Readings from Last 3 Encounters:  08/17/22 130 lb 3.2 oz (59.1 kg)  04/13/22 134 lb 12.8 oz (61.1 kg)  11/04/21 128 lb (58.1 kg)    Lab on 08/15/2022  Component Date Value Ref Range Status   Free T4 08/15/2022 0.88  0.60 - 1.60 ng/dL Final   Comment: Specimens from patients who are undergoing biotin therapy and /or ingesting biotin supplements may contain high levels of biotin.  The higher biotin concentration in these specimens interferes with this Free T4 assay.  Specimens that contain high levels  of biotin may cause false high results for this Free T4 assay.  Please interpret results in light of the total clinical presentation of the patient.     TSH 08/15/2022 1.13  0.35 - 5.50 uIU/mL Final   VITD 08/15/2022 37.27  30.00 - 100.00 ng/mL Final   Hgb A1c MFr Bld 08/15/2022 6.3  4.6 - 6.5 % Final   Glycemic Control Guidelines for People with Diabetes:Non Diabetic:  <6%Goal of Therapy: <7%Additional Action Suggested:  >8%    Sodium 08/15/2022 140  135 - 145 mEq/L Final   Potassium 08/15/2022 4.1  3.5 - 5.1 mEq/L Final   Chloride 08/15/2022 104  96 - 112 mEq/L Final   CO2 08/15/2022 29  19 - 32 mEq/L Final   Glucose, Bld 08/15/2022 105 (H)  70 - 99 mg/dL Final   BUN 40/98/1191 15  6 - 23 mg/dL Final   Creatinine, Ser 08/15/2022 0.57  0.40 - 1.20 mg/dL Final   Total Bilirubin 08/15/2022 0.5  0.2 - 1.2 mg/dL Final   Alkaline  Phosphatase 08/15/2022 76  39 - 117 U/L Final   AST 08/15/2022 19  0 - 37 U/L Final   ALT 08/15/2022 14  0 - 35 U/L Final   Total Protein 08/15/2022 7.0  6.0 - 8.3 g/dL Final   Albumin 47/82/9562 4.0  3.5 - 5.2 g/dL Final   GFR 13/09/6576 105.85  >60.00 mL/min Final  Calculated using the CKD-EPI Creatinine Equation (2021)   Calcium 08/15/2022 8.8  8.4 - 10.5 mg/dL Final   Cholesterol 09/81/1914 193  0 - 200 mg/dL Final   ATP III Classification       Desirable:  < 200 mg/dL               Borderline High:  200 - 239 mg/dL          High:  > = 782 mg/dL   Triglycerides 95/62/1308 112.0  0.0 - 149.0 mg/dL Final   Normal:  <657 mg/dLBorderline High:  150 - 199 mg/dL   HDL 84/69/6295 28.41  >39.00 mg/dL Final   VLDL 32/44/0102 22.4  0.0 - 40.0 mg/dL Final   LDL Cholesterol 08/15/2022 117 (H)  0 - 99 mg/dL Final   Total CHOL/HDL Ratio 08/15/2022 4   Final                  Men          Women1/2 Average Risk     3.4          3.3Average Risk          5.0          4.42X Average Risk          9.6          7.13X Average Risk          15.0          11.0                       NonHDL 08/15/2022 139.20   Final   NOTE:  Non-HDL goal should be 30 mg/dL higher than patient's LDL goal (i.e. LDL goal of < 70 mg/dL, would have non-HDL goal of < 100 mg/dL)    Allergies as of 09/22/3662       Reactions   Sulfa Antibiotics Nausea Only   Still able to take but makes nausea        Medication List        Accurate as of August 17, 2022 11:39 AM. If you have any questions, ask your nurse or doctor.          ascorbic acid 500 MG tablet Commonly known as: VITAMIN C Take 500 mg by mouth daily.   Calcium-Magnesium-Vitamin D 185-50-100 MG-MG-UNIT Caps Take by mouth.   cetirizine 10 MG tablet Commonly known as: ZYRTEC Take 20 mg by mouth daily.   diphenhydrAMINE 25 MG tablet Commonly known as: BENADRYL Take 25 mg by mouth at bedtime as needed.   empagliflozin 25 MG Tabs tablet Commonly known as:  Jardiance Take 1 tablet (25 mg total) by mouth daily before breakfast.   fluconazole 150 MG tablet Commonly known as: DIFLUCAN Take 1 tablet (150 mg total) by mouth daily. Take second tab 3days apart from first tab   fluvastatin 20 MG capsule Commonly known as: LESCOL Take 1 capsule (20 mg total) by mouth at bedtime.   FreeStyle Libre 3 Sensor Misc APPLY 1 SENSOR ON UPPER ARM EVERY 14 DAYS FOR CONTINUOUS GLUCOSE MONITORING   glucose blood test strip Use Onetouch verio test strips as instructed to check blood sugar three times daily.   levocetirizine 5 MG tablet Commonly known as: XYZAL Take 5 mg by mouth every evening.   Lyumjev KwikPen 100 UNIT/ML KwikPen Generic drug: Insulin Lispro-aabc Inject 5 units before each meal   metFORMIN 500 MG 24 hr tablet  Commonly known as: GLUCOPHAGE-XR TAKE TWO TABLETS BY MOUTH TWICE DAILY   OneTouch Delica Lancets 30G Misc 1 each by Does not apply route 3 (three) times daily. Use onetouch delica lancets to check blood sugar three times daily.   OneTouch Verio Flex System w/Device Kit USE AS INSTRUCTED TO CALIBRATE DEXCOM SYSTEM UP TO THREE TIMES A DAY   Tresiba FlexTouch 100 UNIT/ML FlexTouch Pen Generic drug: insulin degludec Inject 14 Units into the skin daily. Adjust as directed   Turmeric 500 MG Caps Take by mouth.   Unifine Pentips Plus 32G X 4 MM Misc Generic drug: Insulin Pen Needle USE ONE PEN NEEDLE TO INJECT INSULIN FOUR TIMES DAILY   vitamin B-1 250 MG tablet Take 250 mg by mouth daily.   Vitamin D 50 MCG (2000 UT) tablet Take 2,000 Units by mouth daily.        Allergies:  Allergies  Allergen Reactions   Sulfa Antibiotics Nausea Only    Still able to take but makes nausea    Past Medical History:  Diagnosis Date   Diabetes mellitus without complication (HCC)     Past Surgical History:  Procedure Laterality Date   APPENDECTOMY     CHOLECYSTECTOMY     PANCREAS SURGERY     Tumor head of pancreas,  incompletely resected    Family History  Problem Relation Age of Onset   Hyperlipidemia Father    Hypertension Father    Coronary artery disease Father    Hearing loss Father    Diabetes Brother    Heart disease Paternal Grandfather     Social History:  reports that she has never smoked. She has never used smokeless tobacco. She reports current alcohol use. She reports that she does not use drugs.    Review of Systems       Lipids: was on Lipitor  previously when she stopped it because of generalized aches and pains which were significant  She  does have a fairly significant family history of CAD including her father  She was taking pravastatin 20 mg and this was also stopped  in December 2017 because of muscle aching  Was recommended taking her fluvastatin 20 mg at least 3 times a day but she refuses to consider any statin drugs  She is not consistent with her diet and does not have any guidelines to follow, sometimes will have more fatty meats or cheese LDL is relatively better compared to last February  LDL is as follows:  Lab Results  Component Value Date   CHOL 193 08/15/2022   CHOL 215 (H) 04/11/2022   CHOL 161 11/02/2021   Lab Results  Component Value Date   HDL 54.20 08/15/2022   HDL 60.50 04/11/2022   HDL 53.30 11/02/2021   Lab Results  Component Value Date   LDLCALC 117 (H) 08/15/2022   LDLCALC 135 (H) 04/11/2022   LDLCALC 94 11/02/2021   Lab Results  Component Value Date   TRIG 112.0 08/15/2022   TRIG 98.0 04/11/2022   TRIG 71.0 11/02/2021   Lab Results  Component Value Date   CHOLHDL 4 08/15/2022   CHOLHDL 4 04/11/2022   CHOLHDL 3 11/02/2021   Lab Results  Component Value Date   LDLDIRECT 105.0 11/06/2019   LDLDIRECT 134.4 08/19/2013       She has had severe vitamin D deficiency diagnosed in 2009 with a level of 5.5. She is taking her recommended dose of vitamin D3, 4000 units   Lab Results  Component Value Date   VD25OH 37.27  08/15/2022   VD25OH 86.13 11/02/2021   VD25OH 34.32 11/06/2019   VD25OH 48.67 01/04/2019    She had fatigue and her B12 level was low previously at 143 She has therapeutic level with B12 supplement  Lab Results  Component Value Date   VITAMINB12 823 10/29/2020    She still complains of periodic fatigue but TSH has been normal and now improved further  Lab Results  Component Value Date   TSH 1.13 08/15/2022   ' She is asking about generalized joint pains and stiffness without swelling, has not discussed with PCP and recommended that she make a visit with him  Physical Examination:  BP 112/70 (BP Location: Left Arm, Patient Position: Sitting, Cuff Size: Small)   Pulse 80   Ht 4\' 10"  (1.473 m)   Wt 130 lb 3.2 oz (59.1 kg)   SpO2 98%   BMI 27.21 kg/m     Diabetic Foot Exam - Simple   Simple Foot Form Diabetic Foot exam was performed with the following findings: Yes   Visual Inspection No deformities, no ulcerations, no other skin breakdown bilaterally: Yes Sensation Testing Intact to touch and monofilament testing bilaterally: Yes Pulse Check Posterior Tibialis and Dorsalis pulse intact bilaterally: Yes Comments      ASSESSMENT/PLAN:   Diabetes type 2 nonobese  See history of present illness for detailed discussion of current management, blood sugar patterns and problems identified  She has an A1c of 6.3  She is on Metformin and Jardiance along with [place] 14 units She will take Lyumjev only for higher carbohydrate meals   Blood sugar control is improving as discussed above with somewhat better diet Weight is slightly improved also Blood sugar patterns from the 30-day libre sensor download were reviewed although in the last 2 weeks only a few days data is available  Recommendations: As before she will need to take her Lyumjev more consistently with eating any significant amount of carbohydrates such as sandwiches Cut back on high fat foods, nutritional  information given No change in [place] unless overnight readings start getting below 90 consistently Regular walking for exercise Stay on Jardiance  LIPIDS: LDL is still high Although this is improving she can still work on her diet further Given booklets on hypercholesterolemia and fast food composition Ideally she should be on a statin drug but she is reluctant to take this because of previous experience  Total visit time for evaluation and management and counseling = 30 minutes  There are no Patient Instructions on file for this visit.      Reather Littler 08/17/2022, 11:39 AM

## 2022-08-24 ENCOUNTER — Other Ambulatory Visit: Payer: Self-pay | Admitting: Endocrinology

## 2022-08-24 DIAGNOSIS — E119 Type 2 diabetes mellitus without complications: Secondary | ICD-10-CM

## 2022-09-05 ENCOUNTER — Other Ambulatory Visit: Payer: Self-pay | Admitting: Endocrinology

## 2022-09-05 ENCOUNTER — Other Ambulatory Visit: Payer: Self-pay

## 2022-09-05 ENCOUNTER — Telehealth: Payer: Self-pay | Admitting: Endocrinology

## 2022-09-05 DIAGNOSIS — E119 Type 2 diabetes mellitus without complications: Secondary | ICD-10-CM

## 2022-09-05 MED ORDER — EMPAGLIFLOZIN 25 MG PO TABS
25.00 mg | ORAL_TABLET | Freq: Every day | ORAL | 3 refills | Status: AC
Start: 2022-09-05 — End: ?

## 2022-09-05 MED ORDER — METFORMIN HCL ER 500 MG PO TB24
1000.0000 mg | ORAL_TABLET | Freq: Two times a day (BID) | ORAL | 0 refills | Status: DC
Start: 2022-09-05 — End: 2022-09-07

## 2022-09-05 MED ORDER — UNIFINE PENTIPS PLUS 32G X 4 MM MISC
3 refills | Status: AC
Start: 2022-09-05 — End: ?

## 2022-09-05 NOTE — Telephone Encounter (Signed)
MEDICATION:  1)  empagliflozin empagliflozin (JARDIANCE) 25 MG TABS tablet  2)Unifine Pentips Plus Insulin Pen Needle (UNIFINE PENTIPS PLUS) 32G X 4 MM MISC  3) metFORMIN metFORMIN (GLUCOPHAGE-XR) 500 MG 24 hr tablet  PHARMACY:    [id number] - [place], [place] - [address] (Ph: [phone])    HAS THE PATIENT CONTACTED THEIR PHARMACY?  Yes  IS THIS A 90 DAY SUPPLY : Yes  IS PATIENT OUT OF MEDICATION: Yes  IF NOT; HOW MUCH IS LEFT:   LAST APPOINTMENT DATE: @6 /19/2024  NEXT APPOINTMENT DATE:  Will be scheduled with Dr. Erroll Luna in September 2024  DO WE HAVE YOUR PERMISSION TO LEAVE A DETAILED MESSAGE?: Yees  OTHER COMMENTS:    **Let patient know to contact pharmacy at the end of the day to make sure medication is ready. **  ** Please notify patient to allow 48-72 hours to process**  **Encourage patient to contact the pharmacy for refills or they can request refills through [facility]**

## 2022-09-07 MED ORDER — EMPAGLIFLOZIN 25 MG PO TABS
25.0000 mg | ORAL_TABLET | Freq: Every day | ORAL | 3 refills | Status: DC
Start: 2022-09-07 — End: 2022-10-11

## 2022-09-07 MED ORDER — METFORMIN HCL ER 500 MG PO TB24
1000.0000 mg | ORAL_TABLET | Freq: Two times a day (BID) | ORAL | 0 refills | Status: DC
Start: 2022-09-07 — End: 2022-10-11

## 2022-09-07 MED ORDER — UNIFINE PENTIPS PLUS 32G X 4 MM MISC
3 refills | Status: DC
Start: 2022-09-07 — End: 2022-10-11

## 2022-09-07 NOTE — Telephone Encounter (Signed)
MEDICATION:  1)  empagliflozin empagliflozin (JARDIANCE) 25 MG TABS tablet   2)Unifine Pentips Plus Insulin Pen Needle (UNIFINE PENTIPS PLUS) 32G X 4 MM MISC   3) metFORMIN metFORMIN (GLUCOPHAGE-XR) 500 MG 24 hr tablet   PHARMACY:    [id number] - [place], [place] - [address] (Ph: [phone])    Has Been Sent

## 2022-10-11 ENCOUNTER — Ambulatory Visit: Payer: BC Managed Care – PPO | Admitting: Internal Medicine

## 2022-10-11 ENCOUNTER — Encounter: Payer: Self-pay | Admitting: Internal Medicine

## 2022-10-11 VITALS — BP 120/78 | HR 78 | Ht <= 58 in | Wt 131.8 lb

## 2022-10-11 DIAGNOSIS — E119 Type 2 diabetes mellitus without complications: Secondary | ICD-10-CM

## 2022-10-11 DIAGNOSIS — Z789 Other specified health status: Secondary | ICD-10-CM

## 2022-10-11 LAB — POCT GLYCOSYLATED HEMOGLOBIN (HGB A1C): Hemoglobin A1C: 6.7 % — AB (ref 4.0–5.6)

## 2022-10-11 MED ORDER — UNIFINE PENTIPS PLUS 32G X 4 MM MISC
1.0000 | Freq: Four times a day (QID) | 3 refills | Status: DC
Start: 2022-10-11 — End: 2023-09-20

## 2022-10-11 MED ORDER — EMPAGLIFLOZIN 25 MG PO TABS
25.0000 mg | ORAL_TABLET | Freq: Every day | ORAL | 3 refills | Status: DC
Start: 2022-10-11 — End: 2023-03-14

## 2022-10-11 MED ORDER — TRESIBA FLEXTOUCH 100 UNIT/ML ~~LOC~~ SOPN: 14.0000 [IU] | PEN_INJECTOR | Freq: Every day | SUBCUTANEOUS | 3 refills | Status: DC

## 2022-10-11 MED ORDER — NOVOLOG FLEXPEN 100 UNIT/ML ~~LOC~~ SOPN: PEN_INJECTOR | SUBCUTANEOUS | 3 refills | Status: DC

## 2022-10-11 MED ORDER — METFORMIN HCL ER 500 MG PO TB24: 2000.0000 mg | ORAL_TABLET | Freq: Every day | ORAL | 3 refills | Status: DC

## 2022-10-11 MED ORDER — FREESTYLE LIBRE 3 SENSOR MISC
1.0000 | 3 refills | Status: DC
Start: 2022-10-11 — End: 2023-03-14

## 2022-10-11 NOTE — Progress Notes (Signed)
Name: Anna Jennings  Age/ Sex: 52 y.o., female   MRN/ DOB: [id number], Jan 25, 1971     PCP: Mliss Sax, MD   Reason for Endocrinology Evaluation: Type 2 Diabetes Mellitus  Initial Endocrine Consultative Visit: 02/11/2013    PATIENT IDENTIFIER: Anna Jennings is a 52 y.o. 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 The patient has followed with Endocrinology clinic since 02/11/2013 for consultative assistance with management of her diabetes.  DIABETIC HISTORY:  Anna Jennings was diagnosed with DM  2007.  Metformin started in 2009, was unable to tolerate regular release, but tolerating XR. Her hemoglobin A1c has ranged from 5.6% in 2020, peaking at 7.1% in 2022.   Patient was followed by Dr. Lucianne Muss from 2014 until 03/2022  SUBJECTIVE:   During the last visit (04/13/2022): Saw Dr. Lucianne Muss  Today (10/11/2022): Anna Jennings is here for follow-up on diabetes management.  She checks her blood sugars multiple times daily through [employer]. The patient has  had hypoglycemic episodes since the last clinic visit. The patient is  symptomatic with these episodes, but there is a questionable accuracy of these readings.   She was diagnosed with pancreatic adenocarcinoma in 1999, she was treated with radiation therapy, she required extensive surgeries with complication in 2018.  They have not been able to confirm this diagnosis    Denies abdominal pain Denies nausea or vomiting  Denies constipation or diarrhea   She eats 2 meals a day, snacks occasionally, avoids sugar-sweetened beverages    HOME DIABETES REGIMEN:  Metformin 500 mg XR, 4 tabs daily Jardiance 25 mg daily Tresiba 14 units daily Lyumjev 3-5 units as needed     Statin: no ACE-I/ARB: no  CONTINUOUS GLUCOSE MONITORING RECORD INTERPRETATION    Dates of Recording: 7/12-10/06/2022  Sensor description: [employer]  Results  statistics:   CGM use % of time 70  Average and SD 140/31.0  Time in range       82 %  % Time Above 180 15  % Time above 250 2  % Time Below target 1   Glycemic patterns summary: BG's are optimal overnight and fluctuate during the day  Hyperglycemic episodes postprandial  Hypoglycemic episodes occurred questionable accuracy  Overnight periods: Optimal  DIABETIC COMPLICATIONS: Microvascular complications:   Denies: CKD Last Eye Exam: Completed 2023  Macrovascular complications:   Denies: CAD, CVA, PVD   HISTORY:  Past Medical History:  Past Medical History:  Diagnosis Date   Diabetes mellitus without complication (HCC)    Past Surgical History:  Past Surgical History:  Procedure Laterality Date   APPENDECTOMY     CHOLECYSTECTOMY     PANCREAS SURGERY     Tumor head of pancreas, incompletely resected   Social History:  reports that she has never smoked. She has never used smokeless tobacco. She reports current alcohol use. She reports that she does not use drugs. Family History:  Family History  Problem Relation Age of Onset   Hyperlipidemia Father    Hypertension Father    Coronary artery disease Father    Hearing loss Father    Diabetes Brother    Heart disease Paternal Grandfather      HOME MEDICATIONS: Allergies as of 10/11/2022       Reactions   Sulfa Antibiotics Nausea Only   Still able to take but makes nausea        Medication List  Accurate as of October 11, 2022  9:57 AM. If you have any questions, ask your nurse or doctor.          ascorbic acid 500 MG tablet Commonly known as: VITAMIN C Take 500 mg by mouth daily.   Calcium-Magnesium-Vitamin D 185-50-100 MG-MG-UNIT Caps Take by mouth.   cetirizine 10 MG tablet Commonly known as: ZYRTEC Take 20 mg by mouth daily.   diphenhydrAMINE 25 MG tablet Commonly known as: BENADRYL Take 25 mg by mouth at bedtime as needed.   empagliflozin 25 MG Tabs tablet Commonly known as:  Jardiance Take 1 tablet (25 mg total) by mouth daily before breakfast.   fluconazole 150 MG tablet Commonly known as: DIFLUCAN Take 1 tablet (150 mg total) by mouth daily. Take second tab 3days apart from first tab   fluvastatin 20 MG capsule Commonly known as: LESCOL Take 1 capsule (20 mg total) by mouth at bedtime.   FreeStyle Libre 3 Sensor Misc APPLY ONE SENSOR ON UPPER ARM EVERY 14 DAYS FOR CONTINUOUS GLUCOSE MONITORING   glucose blood test strip Use Onetouch verio test strips as instructed to check blood sugar three times daily.   levocetirizine 5 MG tablet Commonly known as: XYZAL Take 5 mg by mouth every evening.   Lyumjev KwikPen 100 UNIT/ML KwikPen Generic drug: Insulin Lispro-aabc Inject 5 units before each meal   metFORMIN 500 MG 24 hr tablet Commonly known as: GLUCOPHAGE-XR Take 2 tablets (1,000 mg total) by mouth 2 (two) times daily.   OneTouch Delica Lancets 30G Misc 1 each by Does not apply route 3 (three) times daily. Use onetouch delica lancets to check blood sugar three times daily.   OneTouch Verio Flex System w/Device Kit USE AS INSTRUCTED TO CALIBRATE DEXCOM SYSTEM UP TO THREE TIMES A DAY   Tresiba FlexTouch 100 UNIT/ML FlexTouch Pen Generic drug: insulin degludec Inject 14 Units into the skin daily. Adjust as directed   Turmeric 500 MG Caps Take by mouth.   Unifine Pentips Plus 32G X 4 MM Misc Generic drug: Insulin Pen Needle USE ONE PEN NEEDLE TO INJECT INSULIN FOUR TIMES DAILY   vitamin B-1 250 MG tablet Take 250 mg by mouth daily.   Vitamin D 50 MCG (2000 UT) tablet Take 2,000 Units by mouth daily.         OBJECTIVE:   Vital Signs: BP 120/78 (BP Location: Left Arm, Patient Position: Sitting, Cuff Size: Small)   Pulse 78   Ht 4\' 10"  (1.473 m)   Wt 131 lb 12.8 oz (59.8 kg)   SpO2 99%   BMI 27.55 kg/m   Wt Readings from Last 3 Encounters:  10/11/22 131 lb 12.8 oz (59.8 kg)  08/17/22 130 lb 3.2 oz (59.1 kg)  04/13/22 134 lb  12.8 oz (61.1 kg)     Exam: General: Pt appears well and is in NAD  Neck: General: Supple without adenopathy. Thyroid: Thyroid size normal.  No goiter or nodules appreciated.   Lungs: Clear with good BS bilat   Heart: RRR   Abdomen:  soft, nontender  Extremities: No pretibial edema.   Neuro: MS is good with appropriate affect, pt is alert and Ox3    DM foot exam:10/11/2022    The skin of the feet is intact without sores or ulcerations. The pedal pulses are 2+ on right and 2+ on left. The sensation is intact to a screening 5.07, 10 gram monofilament bilaterally     DATA REVIEWED:  Lab Results  Component Value Date  HGBA1C 6.3 08/15/2022   HGBA1C 6.9 (H) 04/11/2022   HGBA1C 6.0 11/02/2021    Latest Reference Range & Units 08/15/22 11:15  Sodium 135 - 145 mEq/L 140  Potassium 3.5 - 5.1 mEq/L 4.1  Chloride 96 - 112 mEq/L 104  CO2 19 - 32 mEq/L 29  Glucose 70 - 99 mg/dL 098 (H)  BUN 6 - 23 mg/dL 15  Creatinine 1.19 - 1.47 mg/dL 8.29  Calcium 8.4 - 56.2 mg/dL 8.8  Alkaline Phosphatase 39 - 117 U/L 76  Albumin 3.5 - 5.2 g/dL 4.0  AST 0 - 37 U/L 19  ALT 0 - 35 U/L 14  Total Protein 6.0 - 8.3 g/dL 7.0  Total Bilirubin 0.2 - 1.2 mg/dL 0.5  GFR >13.08 mL/min 105.85  Total CHOL/HDL Ratio  4  Cholesterol 0 - 200 mg/dL 657  HDL Cholesterol >84.69 mg/dL 62.95  LDL (calc) 0 - 99 mg/dL 284 (H)  NonHDL  132.44  Triglycerides 0.0 - 149.0 mg/dL 010.2  VLDL 0.0 - 72.5 mg/dL 36.6  (H): Data is abnormally high   Old records , labs and images have been reviewed.     ASSESSMENT / PLAN / RECOMMENDATIONS:   1) Type 2 Diabetes Mellitus, Optimally controlled, Without  complications - Most recent A1c of 6.7 %. Goal A1c < 7.0 %.     -In reviewing freestyle [employer], patient has been noted with postprandial hyperglycemia.  I do suspect decrease in endogenous production on insulin given history of radiation exposure as well as scarring -She has been taking between 3-5 units  of prandial insulin on average once a day -Patient will be provided with a correction scale to be used before each meal -She would like to switch Lyumjev to NovoLog for better pricing -No changes to metformin, Jardiance, and basal insulin at this time -[employer], glycemic agents and pen needles have been refilled to [facility] pharmacy   MEDICATIONS: Continue metformin 500 mg, 4 tablets daily Continue Jardiance 25 mg daily Continue Tresiba 14 units daily Correction factor:Novolog (Bg-130/40) TIDQAC  EDUCATION / INSTRUCTIONS: BG monitoring instructions: Patient is instructed to check her blood sugars 3 times a day. Call [facility] if: BG persistently < 70  I reviewed the Rule of 15 for the treatment of hypoglycemia in detail with the patient. Literature supplied.    2) Diabetic complications:  Eye: Does not have known diabetic retinopathy.  Neuro/ Feet: Does not have known diabetic peripheral neuropathy .  Renal: Patient does not have known baseline CKD. She   is not on an ACEI/ARB at present.    3) Dyslipidemia:  -LDL above goal -Patient intolerant to multiple statins due to joint pains    F/U in 4 months     Signed electronically by: Lyndle Herrlich, MD  [facility] Endocrinology  [facility] [address] [place] [place], [place] [address] Phone: [phone] FAX: [phone]   CC: Mliss Sax, MD [address] [place] [address] Phone: [phone]  Fax: [phone]  Return to Endocrinology clinic as below: No future appointments.

## 2022-10-11 NOTE — Patient Instructions (Signed)
Continue metformin 500 mg, 4 tablets daily Continue Jardiance 25 mg daily Continue Tresiba 14 units daily Novolog correctional insulin: Use the scale below to help guide you before each meal   Blood sugar before meal Number of units to inject  Less than 170 0 unit  171 -  210 1 units  211 -  250 2 units  251 -  290 3 units  291 -  330 4 units  331 -  370 5 units  371 -  410 6 units   HOW TO TREAT LOW BLOOD SUGARS (Blood sugar LESS THAN 70 MG/DL) Please follow the RULE OF 15 for the treatment of hypoglycemia treatment (when your (blood sugars are less than 70 mg/dL)   STEP 1: Take 15 grams of carbohydrates when your blood sugar is low, which includes:  3-4 GLUCOSE TABS  OR 3-4 OZ OF JUICE OR REGULAR SODA OR ONE TUBE OF GLUCOSE GEL    STEP 2: RECHECK blood sugar in 15 MINUTES STEP 3: If your blood sugar is still low at the 15 minute recheck --> then, go back to STEP 1 and treat AGAIN with another 15 grams of carbohydrates.

## 2022-10-12 ENCOUNTER — Encounter: Payer: Self-pay | Admitting: Internal Medicine

## 2022-10-27 ENCOUNTER — Other Ambulatory Visit: Payer: Self-pay

## 2022-10-27 ENCOUNTER — Emergency Department (HOSPITAL_COMMUNITY)
Admission: EM | Admit: 2022-10-27 | Discharge: 2022-10-28 | Disposition: A | Discharge status: Home / Self Care | Payer: BC Managed Care – PPO | Source: Home / Self Care | Attending: Emergency Medicine | Admitting: Emergency Medicine

## 2022-10-27 ENCOUNTER — Encounter (HOSPITAL_COMMUNITY): Payer: Self-pay

## 2022-10-27 DIAGNOSIS — R1013 Epigastric pain: Secondary | ICD-10-CM | POA: Diagnosis not present

## 2022-10-27 DIAGNOSIS — E119 Type 2 diabetes mellitus without complications: Secondary | ICD-10-CM | POA: Diagnosis not present

## 2022-10-27 DIAGNOSIS — R1084 Generalized abdominal pain: Secondary | ICD-10-CM

## 2022-10-27 DIAGNOSIS — R109 Unspecified abdominal pain: Secondary | ICD-10-CM | POA: Diagnosis not present

## 2022-10-27 DIAGNOSIS — Z794 Long term (current) use of insulin: Secondary | ICD-10-CM | POA: Diagnosis not present

## 2022-10-27 DIAGNOSIS — Z7984 Long term (current) use of oral hypoglycemic drugs: Secondary | ICD-10-CM | POA: Insufficient documentation

## 2022-10-27 DIAGNOSIS — N83202 Unspecified ovarian cyst, left side: Secondary | ICD-10-CM | POA: Diagnosis not present

## 2022-10-27 MED ORDER — ACETAMINOPHEN 325 MG PO TABS
650.0000 mg | ORAL_TABLET | Freq: Once | ORAL | Status: AC | PRN
  Administered 2022-10-28: 650 mg via ORAL
  Filled 2022-10-27: qty 2

## 2022-10-27 NOTE — ED Triage Notes (Signed)
Sudden periumbilical pains that began this afternoon. Says pain radiates to the back and presents similar to when she had an obstruction.   Says she also has a pancreatic mass and unsure if it could be related.   Febrile on arrival at 101.

## 2022-10-27 NOTE — ED Notes (Signed)
Pt is ambulatory to restroom w/out assistance. Requested pt provide urine sample and take back to room.

## 2022-10-28 ENCOUNTER — Emergency Department (HOSPITAL_COMMUNITY): Payer: BC Managed Care – PPO

## 2022-10-28 DIAGNOSIS — N83202 Unspecified ovarian cyst, left side: Secondary | ICD-10-CM | POA: Diagnosis not present

## 2022-10-28 DIAGNOSIS — R1013 Epigastric pain: Secondary | ICD-10-CM | POA: Diagnosis not present

## 2022-10-28 LAB — URINALYSIS, ROUTINE W REFLEX MICROSCOPIC
Bacteria, UA: NONE SEEN
Bilirubin Urine: NEGATIVE
Glucose, UA: >=500 mg/dL — AB
Hgb urine dipstick: NEGATIVE
Ketones, ur: 20 mg/dL — AB
Nitrite: NEGATIVE
Protein, ur: NEGATIVE mg/dL
Specific Gravity, Urine: 1.027 (ref 1.005–1.030)
pH: 5 (ref 5.0–8.0)

## 2022-10-28 LAB — CBC WITH DIFFERENTIAL/PLATELET
Abs Immature Granulocytes: 0.02 10*3/uL (ref 0.00–0.07)
Basophils Absolute: 0 10*3/uL (ref 0.0–0.1)
Basophils Relative: 0 %
Eosinophils Absolute: 0.1 10*3/uL (ref 0.0–0.5)
Eosinophils Relative: 1 %
HCT: 39.5 % (ref 36.0–46.0)
Hemoglobin: 12.9 g/dL (ref 12.0–15.0)
Immature Granulocytes: 0 %
Lymphocytes Relative: 13 %
Lymphs Abs: 0.9 10*3/uL (ref 0.7–4.0)
MCH: 27.5 pg (ref 26.0–34.0)
MCHC: 32.7 g/dL (ref 30.0–36.0)
MCV: 84.2 fL (ref 80.0–100.0)
Monocytes Absolute: 0.4 10*3/uL (ref 0.1–1.0)
Monocytes Relative: 5 %
Neutro Abs: 5.9 10*3/uL (ref 1.7–7.7)
Neutrophils Relative %: 81 %
Platelets: 243 10*3/uL (ref 150–400)
RBC: 4.69 MIL/uL (ref 3.87–5.11)
RDW: 13.2 % (ref 11.5–15.5)
WBC: 7.3 10*3/uL (ref 4.0–10.5)
nRBC: 0 % (ref 0.0–0.2)

## 2022-10-28 LAB — COMPREHENSIVE METABOLIC PANEL
ALT: 18 U/L (ref 0–44)
AST: 24 U/L (ref 15–41)
Albumin: 4.1 g/dL (ref 3.5–5.0)
Alkaline Phosphatase: 77 U/L (ref 38–126)
Anion gap: 10 (ref 5–15)
BUN: 18 mg/dL (ref 6–20)
CO2: 26 mmol/L (ref 22–32)
Calcium: 9 mg/dL (ref 8.9–10.3)
Chloride: 100 mmol/L (ref 98–111)
Creatinine, Ser: 0.62 mg/dL (ref 0.44–1.00)
GFR, Estimated: >60 mL/min (ref >60)
Glucose, Bld: 127 mg/dL — ABNORMAL HIGH (ref 70–99)
Potassium: 3.5 mmol/L (ref 3.5–5.1)
Sodium: 136 mmol/L (ref 135–145)
Total Bilirubin: 0.6 mg/dL (ref 0.3–1.2)
Total Protein: 7.7 g/dL (ref 6.5–8.1)

## 2022-10-28 LAB — HCG, SERUM, QUALITATIVE: Preg, Serum: NEGATIVE

## 2022-10-28 LAB — LIPASE, BLOOD: Lipase: 23 U/L (ref 11–51)

## 2022-10-28 MED ORDER — SODIUM CHLORIDE (PF) 0.9 % IJ SOLN
INTRAMUSCULAR | Status: AC
  Filled 2022-10-28: qty 50

## 2022-10-28 MED ORDER — IOHEXOL 300 MG/ML  SOLN
100.0000 mL | Freq: Once | INTRAMUSCULAR | Status: AC | PRN
  Administered 2022-10-28: 100 mL via INTRAVENOUS

## 2022-10-28 NOTE — ED Provider Notes (Signed)
[place] EMERGENCY DEPARTMENT AT [facility] Provider Note   CSN: [id number] Arrival date & time: 10/27/22  2325     History  Chief Complaint  Patient presents with   Abdominal Pain    Anna Jennings is a 52 y.o. female.  Patient with history of appendectomy, cholecystectomy, T2DM, SBO status post bowel resection in 2018 presents today with complaints of abdominal pain. She states that she has felt bloated for the past 2 days and then began to develop lower abdominal cramping pain which intensified throughout the evening yesterday. States that her pain felt similar to when she had her SBO previously which was concerning for her. Since her surgery in 2018 she has not had any complications. When she initially came in she was noted to be febrile at 101. Denies nausea, vomiting, or diarrhea. States that she has been having regular bowel movements and passing flatus. Upon my evaluation several hours later due to extended wait times, patient states that her pain has mostly resolved.  The history is provided by the patient. No language interpreter was used.  Abdominal Pain      Home Medications Prior to Admission medications   Medication Sig Start Date End Date Taking? Authorizing Provider  Blood Glucose Monitoring Suppl (ONETOUCH VERIO FLEX SYSTEM) w/Device KIT USE AS INSTRUCTED TO CALIBRATE DEXCOM SYSTEM UP TO THREE TIMES A DAY 03/18/20   Reather Littler, MD  Calcium-Magnesium-Vitamin D 185-50-100 MG-MG-UNIT CAPS Take by mouth.    [provider]  cetirizine (ZYRTEC) 10 MG tablet Take 20 mg by mouth daily.     [provider]  Cholecalciferol (VITAMIN D) 50 MCG (2000 UT) tablet Take 2,000 Units by mouth daily.    [provider]  Continuous Glucose Sensor (FREESTYLE LIBRE 3 SENSOR) MISC 1 Device by Other route every 14 (fourteen) days. APPLY ONE SENSOR ON UPPER ARM EVERY 14 DAYS FOR CONTINUOUS GLUCOSE MONITORING 10/11/22   Shamleffer, Konrad Dolores, MD   diphenhydrAMINE (BENADRYL) 25 MG tablet Take 25 mg by mouth at bedtime as needed.    [provider]  empagliflozin (JARDIANCE) 25 MG TABS tablet Take 1 tablet (25 mg total) by mouth daily before breakfast. 10/11/22   Shamleffer, Konrad Dolores, MD  fluconazole (DIFLUCAN) 150 MG tablet Take 1 tablet (150 mg total) by mouth daily. Take second tab 3days apart from first tab 04/27/20   Nche, Bonna Gains, NP  fluvastatin (LESCOL) 20 MG capsule Take 1 capsule (20 mg total) by mouth at bedtime. 06/18/21   Reather Littler, MD  glucose blood test strip Use Onetouch verio test strips as instructed to check blood sugar three times daily. 11/12/19   Reather Littler, MD  insulin aspart (NOVOLOG FLEXPEN) 100 UNIT/ML FlexPen Max daily 30 units 10/11/22   Shamleffer, Konrad Dolores, MD  insulin degludec (TRESIBA FLEXTOUCH) 100 UNIT/ML FlexTouch Pen Inject 14 Units into the skin daily. Adjust as directed 10/11/22   Shamleffer, Konrad Dolores, MD  Insulin Pen Needle (UNIFINE PENTIPS PLUS) 32G X 4 MM MISC 1 Device by Other route in the morning, at noon, in the evening, and at bedtime. USE ONE PEN NEEDLE TO INJECT INSULIN FOUR TIMES DAILY 10/11/22   Shamleffer, Konrad Dolores, MD  levocetirizine (XYZAL) 5 MG tablet Take 5 mg by mouth every evening.    [provider]  metFORMIN (GLUCOPHAGE-XR) 500 MG 24 hr tablet Take 4 tablets (2,000 mg total) by mouth daily with breakfast. 10/11/22   Shamleffer, Konrad Dolores, MD  OneTouch Delica Lancets 30G MISC  1 each by Does not apply route 3 (three) times daily. Use onetouch delica lancets to check blood sugar three times daily. 02/06/19   Reather Littler, MD  Thiamine HCl (VITAMIN B-1) 250 MG tablet Take 250 mg by mouth daily.    [provider]  Turmeric 500 MG CAPS Take by mouth.    [provider]  vitamin C (ASCORBIC ACID) 500 MG tablet Take 500 mg by mouth daily.    [provider]      Allergies    Sulfa antibiotics    Review of Systems    Review of Systems  Gastrointestinal:  Positive for abdominal pain.  All other systems reviewed and are negative.   Physical Exam Updated Vital Signs BP 119/75   Pulse 86   Temp 98.3 F (36.8 C) (Oral)   Resp 18   Ht 4\' 10"  (1.473 m)   Wt 59 kg   LMP 01/20/2017   SpO2 100%   BMI 27.17 kg/m  Physical Exam Vitals and nursing note reviewed.  Constitutional:      General: She is not in acute distress.    Appearance: Normal appearance. She is normal weight. She is not ill-appearing, toxic-appearing or diaphoretic.  HENT:     Head: Normocephalic and atraumatic.  Cardiovascular:     Rate and Rhythm: Normal rate.  Pulmonary:     Effort: Pulmonary effort is normal. No respiratory distress.  Abdominal:     General: Abdomen is flat.     Palpations: Abdomen is soft.     Tenderness: There is no abdominal tenderness.  Musculoskeletal:        General: Normal range of motion.     Cervical back: Normal range of motion.  Skin:    General: Skin is warm and dry.  Neurological:     General: No focal deficit present.     Mental Status: She is alert.  Psychiatric:        Mood and Affect: Mood normal.        Behavior: Behavior normal.     ED Results / Procedures / Treatments   Labs (all labs ordered are listed, but only abnormal results are displayed) Labs Reviewed  COMPREHENSIVE METABOLIC PANEL - Abnormal; Notable for the following components:      Result Value   Glucose, Bld 127 (*)    All other components within normal limits  URINALYSIS, ROUTINE W REFLEX MICROSCOPIC - Abnormal; Notable for the following components:   Glucose, UA >=500 (*)    Ketones, ur 20 (*)    Leukocytes,Ua TRACE (*)    All other components within normal limits  LIPASE, BLOOD  HCG, SERUM, QUALITATIVE  CBC WITH DIFFERENTIAL/PLATELET    EKG None  Radiology CT ABDOMEN PELVIS W CONTRAST  Result Date: 10/28/2022 CLINICAL DATA:  Epigastric pain radiating into back since 4 a.m. this morning. 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 * Tracking Code: BO * EXAM: CT ABDOMEN AND PELVIS WITH CONTRAST TECHNIQUE: Multidetector CT imaging of the abdomen and pelvis was performed using the standard protocol following bolus administration of intravenous contrast. RADIATION DOSE REDUCTION: This exam was performed according to the departmental dose-optimization program which includes automated exposure control, adjustment of the mA and/or kV according to patient size and/or use of iterative reconstruction technique. CONTRAST:  OMNIPAQUE IOHEXOL 300 MG/ML  SOLN COMPARISON:  None Available. FINDINGS: Lower chest: No acute abnormality. Hepatobiliary: There is a indeterminate, too small  to characterize low attenuation structure within inferior aspect of segment 4 B measuring 9 mm, image 23/2. No additional focal liver abnormality noted. Cholecystectomy. Signs of previous pancreaticobiliary anastomosis. Pancreas: Postoperative change involving the anterior head of pancreas identified. With evidence of prior pancreaticobiliary anastomosis. Here, there is amorphous increased soft tissue which measures 2.7 x 3.7 cm and contains multiple internal areas of calcification. The tail of pancreas diffusely atrophic. The increased soft tissue extends up to and is inseparable from the posteromedial wall of the proximal duodenum with loss of fat plane between these two structures. The tail of pancreas appears diffusely atrophic. Focal narrowing of the extrahepatic portal vein is identified as a courses through this area. Spleen: Normal in size without focal abnormality. Adrenals/Urinary Tract: Normal appearance of the adrenal glands. Scarring in volume loss along the anterior cortex of the right kidney noted. No hydronephrosis, nephrolithiasis or mass. The urinary bladder is unremarkable. Stomach/Bowel: Signs of prior jejunostomy. No pathologic dilatation of the large or small bowel  loops. No bowel wall thickening or inflammation. Moderate stool burden throughout the colon. Vascular/Lymphatic: Aortic atherosclerosis. Within the left lower quadrant of the abdomen there is a 1.2 cm nodule with poorly defined margins, image 43/2 and image 42/7. No retroperitoneal or pelvic adenopathy. Reproductive: There is an IUD within the pelvis. Left ovary cyst measures 1.8 cm, image 62/2. Other: No free fluid or fluid collections. No signs of pneumoperitoneum. Musculoskeletal: No acute or significant osseous findings. IMPRESSION: 1. No acute findings within the abdomen or pelvis. 2. Postoperative change involving the anterior head of pancreas with evidence of prior pancreaticobiliary anastomosis. There is amorphous increased soft tissue which measures 2.7 x 3.7 cm and contains multiple internal areas of calcification. There are no imaging available for side-by-side comparison. According Epic Care Everywhere a similar finding has been reported dating back to 06/02/2016 The increased soft tissue extends up to and is inseparable from the posteromedial wall of the proximal duodenum with loss of fat plane between these two structures. As this was reported across multiple prior imaging findings are likely to reflect chronic postoperative change. However, without prior imaging for direct side-by-side comparison and knowledge of patient's prior pancreatic neoplasm underlying residual/recurrent tumor cannot be excluded. For this reason direct side-by-side comparison with prior imaging is strongly advised. 3. There is a 1.2 cm nodule within the left lower quadrant of the abdomen with poorly defined margins. This is nonspecific and may represent a small lymph node. Again, this is indeterminate and comparison with prior outside imaging studies would be helpful to confirm benignity. 4. Indeterminate, too small to characterize low attenuation structure within inferior aspect of segment 4B measuring 9 mm. There was no  mention of this lesion on prior outside imaging reports. Comparison with remote imaging would be the most effective and cost efficient way to confirm the benignity of this lesion. 5. Left ovary cyst measures 1.8 cm. 6. Moderate stool burden throughout the colon. 7.  Aortic Atherosclerosis (ICD10-I70.0). Electronically Signed   By: Signa Kell M.D.   On: 10/28/2022 08:18    Procedures Procedures    Medications Ordered in ED Medications  acetaminophen (TYLENOL) tablet 650 mg (650 mg Oral Given 10/28/22 0009)  iohexol (OMNIPAQUE) 300 MG/ML solution 100 mL (100 mLs Intravenous Contrast Given 10/28/22 0702)    ED Course/ Medical Decision Making/ A&P  Medical Decision Making Amount and/or Complexity of Data Reviewed Labs: ordered.  Risk OTC drugs. Prescription drug management.   This patient is a 52 y.o. female who presents to the ED for concern of abdominal pain, this involves an extensive number of treatment options, and is a complaint that carries with it a high risk of complications and morbidity. The emergent differential diagnosis prior to evaluation includes, but is not limited to,  The differential diagnosis for generalized abdominal pain includes, but is not limited to AAA, gastroenteritis, appendicitis, Bowel obstruction, Bowel perforation. Gastroparesis, DKA, Hernia, Inflammatory bowel disease, mesenteric ischemia, pancreatitis, peritonitis SBP, volvulus.   This is not an exhaustive differential.   Past Medical History / Co-morbidities / Social History: history of appendectomy, cholecystectomy, T2DM, SBO status post bowel resection in 2018   SDOH Screenings   Depression (PHQ2-9): Low Risk  (12/09/2020)  Social Connections: Moderately Integrated (10/26/2021)   Received from [facility]  Tobacco Use: Low Risk  (10/27/2022)    has a past medical history of Diabetes mellitus without complication (HCC).  Additional  history: Chart reviewed  Physical Exam: Physical exam performed. The pertinent findings include: abdomen soft and non-tender  Lab Tests: I ordered, and personally interpreted labs.  The pertinent results include: No acute laboratory abnormalities   Imaging Studies: I ordered imaging studies including CT abdomen pelvis. I independently visualized and interpreted imaging which showed   1. No acute findings within the abdomen or pelvis. 2. Postoperative change involving the anterior head of pancreas with evidence of prior pancreaticobiliary anastomosis. There is amorphous increased soft tissue which measures 2.7 x 3.7 cm and contains multiple internal areas of calcification. There are no imaging available for side-by-side comparison. According Epic Care Everywhere a similar finding has been reported dating back to 06/02/2016 The increased soft tissue extends up to and is inseparable from the posteromedial wall of the proximal duodenum with loss of fat plane between these two structures. As this was reported across multiple prior imaging findings are likely to reflect chronic postoperative change. However, without prior imaging for direct side-by-side comparison and knowledge of patient's prior pancreatic neoplasm underlying residual/recurrent tumor cannot be excluded. For this reason direct side-by-side comparison with prior imaging is strongly advised. 3. There is a 1.2 cm nodule within the left lower quadrant of the abdomen with poorly defined margins. This is nonspecific and may represent a small lymph node. Again, this is indeterminate and comparison with prior outside imaging studies would be helpful to confirm benignity. 4. Indeterminate, too small to characterize low attenuation structure within inferior aspect of segment 4B measuring 9 mm. There was no mention of this lesion on prior outside imaging reports. Comparison with remote imaging would be the most effective and  cost efficient way to confirm the benignity of this lesion. 5. Left ovary cyst measures 1.8 cm. 6. Moderate stool burden throughout the colon.   I agree with the radiologist interpretation.   Unable to review prior imaging for side by side comparison   Medications: I ordered medication including tylenol for fever. Reevaluation of the patient after these medicines showed that the patient improved. I have reviewed the patients home medicines and have made adjustments as needed.   Disposition: After consideration of the diagnostic results and the patients response to treatment, I feel that emergency department workup does not suggest an emergent condition requiring admission or immediate intervention beyond what has been performed at this time. The plan is: Discharge with close outpatient follow-up and return  precautions.  Several incidental findings noted on her CT scan, however none of them appear acute or emergent and are likely present from her previous operation in 2018.  Patient's symptoms have completely resolved since my initial evaluation and have not returned.  Her abdomen is soft and nontender.  She is able to eat and drink without any nausea or vomiting.  She is having regular bowel movements.  I have given her the CT report as well as a CD of her images with instructions to follow-up with either her primary doctor or surgeon to ensure that these incidental findings are not new.  Patient is understanding and in agreement with this plan.  Evaluation and diagnostic testing in the emergency department does not suggest an emergent condition requiring admission or immediate intervention beyond what has been performed at this time.  Plan for discharge with close PCP follow-up.  Patient is understanding and amenable with plan, educated on red flag symptoms that would prompt immediate return.  Patient discharged in stable condition.  Final Clinical Impression(s) / ED Diagnoses Final diagnoses:   Generalized abdominal pain    Rx / DC Orders ED Discharge Orders     None     An After Visit Summary was printed and given to the patient.     Vear Clock 10/28/22 1024    Bethann Berkshire, MD 10/29/22 [phone]

## 2022-10-28 NOTE — Discharge Instructions (Signed)
As we discussed, your workup in the ER today was reassuring for acute findings.  Laboratory evaluation and CT imaging did not reveal any emergent causes of your symptoms and given that your pain has been gone for several hours no additional evaluation is indicated.  There were several incidental findings seen on your CT scan that are likely all there from previous imaging and procedures performed several years ago.  However, unfortunately for some reason we are unable to directly visualize the images and are therefore unable to definitively say that these are not new and because of this you need to follow-up with your primary doctor and/for your surgeon to ensure that there are no new concerning findings on this scan.  Regardless, the scan does not show anything emergently concerning.  Everything can be managed in the outpatient setting.  Please call your primary doctor to schedule follow-up appointment.  Return if development of any new or worsening symptoms.

## 2022-11-02 DIAGNOSIS — K8689 Other specified diseases of pancreas: Secondary | ICD-10-CM | POA: Diagnosis not present

## 2022-11-02 DIAGNOSIS — R1030 Lower abdominal pain, unspecified: Secondary | ICD-10-CM | POA: Diagnosis not present

## 2022-11-03 DIAGNOSIS — M2011 Hallux valgus (acquired), right foot: Secondary | ICD-10-CM | POA: Diagnosis not present

## 2022-11-03 DIAGNOSIS — M7662 Achilles tendinitis, left leg: Secondary | ICD-10-CM | POA: Diagnosis not present

## 2022-11-09 ENCOUNTER — Telehealth: Payer: Self-pay

## 2022-11-09 NOTE — Transitions of Care (Post Inpatient/ED Visit) (Signed)
11/09/2022  Name: Anna Jennings MRN: [id number] DOB: 19-Jul-1970  Today's TOC FU Call Status: Today's TOC FU Call Status:: Successful TOC FU Call Completed TOC FU Call Complete Date: 11/09/22 Patient's Name and Date of Birth confirmed.  Transition Care Management Follow-up Telephone Call Date of Discharge: 10/27/22 Discharge Facility: [facility]) Type of Discharge: Emergency Department Reason for ED Visit: Other: (Abdominal Pain) How have you been since you were released from the hospital?: Better Any questions or concerns?: No  Items Reviewed: Did you receive and understand the discharge instructions provided?: Yes Medications obtained,verified, and reconciled?: Partial Review Completed Any new allergies since your discharge?: No Dietary orders reviewed?: No Do you have support at home?: Yes People in Home: spouse Name of Support/Comfort Primary Source: Roe Coombs  Medications Reviewed Today: Medications Reviewed Today     Reviewed by Jodelle Gross, RN (Case Manager) on 11/09/22 at 1440  Med List Status: <None>   Medication Order Taking? Sig Documenting Provider Last Dose Status Informant  Blood Glucose Monitoring Suppl (ONETOUCH VERIO FLEX SYSTEM) w/Device KIT [id number] No USE AS INSTRUCTED TO CALIBRATE DEXCOM SYSTEM UP TO THREE TIMES A DAY Reather Littler, MD Unknown Active            Med Note [facility]   Wed Nov 09, 2022  2:37 PM) Medication Review not completed  Calcium-Magnesium-Vitamin D 185-50-100 MG-MG-UNIT CAPS [id number]  Take by mouth. [provider]  Active   cetirizine (ZYRTEC) 10 MG tablet [id number]  Take 20 mg by mouth daily.  [provider]  Active   Cholecalciferol (VITAMIN D) 50 MCG (2000 UT) tablet [id number]  Take 2,000 Units by mouth daily. [provider]  Active   Continuous Glucose Sensor (FREESTYLE LIBRE 3 SENSOR) [place] [id number]  1 Device by Other route every 14 (fourteen) days. APPLY ONE SENSOR ON UPPER ARM EVERY 14  DAYS FOR CONTINUOUS GLUCOSE MONITORING Shamleffer, Konrad Dolores, MD  Active   diphenhydrAMINE (BENADRYL) 25 MG tablet [id number]  Take 25 mg by mouth at bedtime as needed. [provider]  Active   empagliflozin (JARDIANCE) 25 MG TABS tablet [id number]  Take 1 tablet (25 mg total) by mouth daily before breakfast. Shamleffer, Konrad Dolores, MD  Active   fluconazole (DIFLUCAN) 150 MG tablet [id number]  Take 1 tablet (150 mg total) by mouth daily. Take second tab 3days apart from first tab Nche, Bonna Gains, NP  Active   fluvastatin (LESCOL) 20 MG capsule [id number]  Take 1 capsule (20 mg total) by mouth at bedtime. Reather Littler, MD  Active   glucose blood test strip [id number]  Use Onetouch verio test strips as instructed to check blood sugar three times daily. Reather Littler, MD  Active   insulin aspart (NOVOLOG FLEXPEN) 100 UNIT/ML FlexPen [id number]  Max daily 30 units Shamleffer, Konrad Dolores, MD  Active   insulin degludec (TRESIBA FLEXTOUCH) 100 UNIT/ML FlexTouch Pen [id number]  Inject 14 Units into the skin daily. Adjust as directed Shamleffer, Konrad Dolores, MD  Active   Insulin Pen Needle (UNIFINE PENTIPS PLUS) 32G X 4 MM MISC [id number]  1 Device by Other route in the morning, at noon, in the evening, and at bedtime. USE ONE PEN NEEDLE TO INJECT INSULIN FOUR TIMES DAILY Shamleffer, Konrad Dolores, MD  Active   levocetirizine (XYZAL) 5 MG tablet [id number]  Take 5 mg by mouth every evening. [provider]  Active            Med Note Jenne Pane, [facility]   Wed Oct 23, 2019 11:09 AM) PRN  metFORMIN (GLUCOPHAGE-XR) 500 MG 24 hr tablet [id number]  Take 4 tablets (2,000 mg total) by mouth daily with breakfast. Shamleffer, Konrad Dolores, MD  Active   OneTouch Delica Lancets 30G MISC [id number]  1 each by Does not apply route 3 (three) times daily. Use onetouch delica lancets to check blood sugar three times daily. Reather Littler, MD  Active   Thiamine HCl (VITAMIN B-1) 250 MG tablet  [id number]  Take 250 mg by mouth daily. [provider]  Active   Turmeric 500 MG CAPS [id number]  Take by mouth. [provider]  Active   vitamin C (ASCORBIC ACID) 500 MG tablet [id number]  Take 500 mg by mouth daily. [provider]  Active             Home Care and Equipment/Supplies: Were Home Health Services Ordered?: NA Any new equipment or medical supplies ordered?: NA Functional Questionnaire: Not assessed Follow up appointments reviewed: PCP Follow-up appointment confirmed?: No MD Provider Line [phone] Given: No (Patient refused appointment with PCP noting she already has been seen regarding the abdominal pain.) Specialist Hospital Follow-up appointment confirmed?: NA Do you need transportation to your follow-up appointment?: No Do you understand care options if your condition(s) worsen?: Yes-patient verbalized understanding Jodelle Gross RN, BSN, CCM [facility] RN Care Coordinator/ Transitions of Care Direct Dial: [phone]  Fax: [phone]

## 2022-12-05 DIAGNOSIS — Z01419 Encounter for gynecological examination (general) (routine) without abnormal findings: Secondary | ICD-10-CM | POA: Diagnosis not present

## 2022-12-05 DIAGNOSIS — N951 Menopausal and female climacteric states: Secondary | ICD-10-CM | POA: Diagnosis not present

## 2022-12-05 DIAGNOSIS — Z30432 Encounter for removal of intrauterine contraceptive device: Secondary | ICD-10-CM | POA: Diagnosis not present

## 2022-12-05 DIAGNOSIS — Z6826 Body mass index (BMI) 26.0-26.9, adult: Secondary | ICD-10-CM | POA: Diagnosis not present

## 2022-12-05 DIAGNOSIS — E559 Vitamin D deficiency, unspecified: Secondary | ICD-10-CM | POA: Diagnosis not present

## 2022-12-05 DIAGNOSIS — Z1330 Encounter for screening examination for mental health and behavioral disorders, unspecified: Secondary | ICD-10-CM | POA: Diagnosis not present

## 2023-02-17 DIAGNOSIS — R87615 Unsatisfactory cytologic smear of cervix: Secondary | ICD-10-CM | POA: Diagnosis not present

## 2023-03-13 NOTE — Progress Notes (Signed)
 Name: Anna Jennings  Age/ Sex: 53 y.o., female   MRN/ DOB: [id number], Jun 18, 1970     PCP: Berneta Elsie Sayre, MD   Reason for Endocrinology Evaluation: Type 2 Diabetes Mellitus  Initial Endocrine Consultative Visit: 02/11/2013    PATIENT IDENTIFIER: Anna Jennings is a 53 y.o. 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 The patient has followed with Endocrinology clinic since 02/11/2013 for consultative assistance with management of her diabetes.  DIABETIC HISTORY:  [name] was diagnosed with DM  2007.  Metformin  started in 2009, was unable to tolerate regular release, but tolerating XR. Her hemoglobin A1c has ranged from 5.6% in 2020, peaking at 7.1% in 2022.   Patient was followed by Dr. Von from 2014 until 03/2022  SUBJECTIVE:   During the last visit (10/11/2022): A1c 6.7%  Today (03/14/2023): [name] is here for follow-up on diabetes management.  She checks her blood sugars multiple times daily through [employer]. The patient has  had hypoglycemic episodes since the last clinic visit. The patient is  symptomatic with these episodes, but there is a questionable accuracy of these readings.   She was diagnosed with pancreatic adenocarcinoma in 1999, she was treated with radiation therapy, she required extensive surgeries with complication in 2018.   Was evaluated by oncology 10/2022 and no evidence of malignancy   Denies nausea or vomiting  She has diarrhea last week ( Thursday night ), diarrhea has resolved but she has noted loose stools, she had no fever at the time but did notice an episode of chills  She is complaining of weight gain, that she attributes to menopause, she is on Climara  through GYN.  She does eat regular meals and plays tennis 90 minutes twice weekly    HOME DIABETES REGIMEN:  Metformin  500 mg XR, 4 tabs daily Jardiance  25 mg daily Tresiba  14 units  daily Correction factor:Novolog  (Bg-130/40) TIDQAC     Statin: no ACE-I/ARB: no  CONTINUOUS GLUCOSE MONITORING RECORD INTERPRETATION    Dates of Recording: 02/13/2023- 03/12/2023  Sensor description: [employer]  Results statistics:   CGM use % of time 97  Average and SD 140/30.5  Time in range   81%  % Time Above 180 17  % Time above 250 1  % Time Below target 1   Glycemic patterns summary: BGs are optimal most day and night  Hyperglycemic episodes postprandial  Hypoglycemic episodes occurred questionable accuracy  Overnight periods: Optimal    DIABETIC COMPLICATIONS: Microvascular complications:   Denies: CKD Last Eye Exam: Completed 2023  Macrovascular complications:   Denies: CAD, CVA, PVD   HISTORY:  Past Medical History:  Past Medical History:  Diagnosis Date   Diabetes mellitus without complication (HCC)    Past Surgical History:  Past Surgical History:  Procedure Laterality Date   APPENDECTOMY     CHOLECYSTECTOMY     PANCREAS SURGERY     Tumor head of pancreas, incompletely resected   Social History:  reports that she has never smoked. She has never used smokeless tobacco. She reports current alcohol use. She reports that she does not use drugs. Family History:  Family History  Problem Relation Age of Onset   Hyperlipidemia Father    Hypertension Father    Coronary artery disease Father    Hearing loss Father    Diabetes Brother    Heart disease Paternal Grandfather  HOME MEDICATIONS: Allergies as of 03/14/2023       Reactions   Sulfa Antibiotics Nausea Only   Still able to take but makes nausea        Medication List        Accurate as of March 14, 2023 12:29 PM. If you have any questions, ask your nurse or doctor.          ascorbic acid 500 MG tablet Commonly known as: VITAMIN C Take 500 mg by mouth daily.   Calcium -Magnesium-Vitamin D  185-50-100 MG-MG-UNIT Caps Take by mouth.   cetirizine 10 MG  tablet Commonly known as: ZYRTEC Take 20 mg by mouth daily.   Climara  Pro 0.045-0.015 MG/DAY Generic drug: estradiol -levonorgestrel Place 1 patch onto the skin once a week.   diphenhydrAMINE 25 MG tablet Commonly known as: BENADRYL Take 25 mg by mouth at bedtime as needed.   empagliflozin  25 MG Tabs tablet Commonly known as: Jardiance  Take 1 tablet (25 mg total) by mouth daily before breakfast.   fluconazole  150 MG tablet Commonly known as: DIFLUCAN  Take 1 tablet (150 mg total) by mouth daily. Take second tab 3days apart from first tab   fluvastatin  20 MG capsule Commonly known as: LESCOL  Take 1 capsule (20 mg total) by mouth at bedtime.   FreeStyle Libre 3 Sensor Misc 1 Device by Other route every 14 (fourteen) days. APPLY ONE SENSOR ON UPPER ARM EVERY 14 DAYS FOR CONTINUOUS GLUCOSE MONITORING   glucose blood test strip Use Onetouch verio test strips as instructed to check blood sugar three times daily.   levocetirizine 5 MG tablet Commonly known as: XYZAL Take 5 mg by mouth every evening.   metFORMIN  500 MG 24 hr tablet Commonly known as: GLUCOPHAGE -XR Take 4 tablets (2,000 mg total) by mouth daily with breakfast.   NovoLOG  FlexPen 100 UNIT/ML FlexPen Generic drug: insulin  aspart Max daily 30 units   OneTouch Delica Lancets 30G Misc 1 each by Does not apply route 3 (three) times daily. Use onetouch delica lancets to check blood sugar three times daily.   OneTouch Verio Flex System w/Device Kit USE AS INSTRUCTED TO CALIBRATE DEXCOM SYSTEM UP TO THREE TIMES A DAY   Tresiba  FlexTouch 100 UNIT/ML FlexTouch Pen Generic drug: insulin  degludec Inject 14 Units into the skin daily. Adjust as directed   Turmeric 500 MG Caps Take by mouth.   Unifine Pentips Plus 32G X 4 MM Misc Generic drug: Insulin  Pen Needle 1 Device by Other route in the morning, at noon, in the evening, and at bedtime. USE ONE PEN NEEDLE TO INJECT INSULIN  FOUR TIMES DAILY   vitamin B-1 250 MG  tablet Take 250 mg by mouth daily.   Vitamin D  50 MCG (2000 UT) tablet Take 2,000 Units by mouth daily.         OBJECTIVE:   Vital Signs: BP 112/70 (BP Location: Left Arm, Patient Position: Sitting, Cuff Size: Small)   Pulse 80   Ht 4' 10 (1.473 m)   Wt 136 lb (61.7 kg)   LMP 01/20/2017   SpO2 99%   BMI 28.42 kg/m   Wt Readings from Last 3 Encounters:  03/14/23 136 lb (61.7 kg)  10/27/22 130 lb (59 kg)  10/11/22 131 lb 12.8 oz (59.8 kg)     Exam: General: Pt appears well and is in NAD  Neck: General: Supple without adenopathy. Thyroid : No goiter or nodules appreciated.   Lungs: Clear with good BS bilat   Heart: RRR   Abdomen:  soft, nontender  Extremities: No pretibial edema.   Neuro: MS is good with appropriate affect, pt is alert and Ox3    DM foot exam:10/11/2022    The skin of the feet is intact without sores or ulcerations. The pedal pulses are 2+ on right and 2+ on left. The sensation is intact to a screening 5.07, 10 gram monofilament bilaterally     DATA REVIEWED:  Lab Results  Component Value Date   HGBA1C 6.6 (A) 03/14/2023   HGBA1C 6.7 (A) 10/11/2022   HGBA1C 6.3 08/15/2022    Latest Reference Range & Units 10/28/22 00:04  Sodium 135 - 145 mmol/L 136  Potassium 3.5 - 5.1 mmol/L 3.5  Chloride 98 - 111 mmol/L 100  CO2 22 - 32 mmol/L 26  Glucose 70 - 99 mg/dL 872 (H)  BUN 6 - 20 mg/dL 18  Creatinine 9.55 - 8.99 mg/dL 9.37  Calcium  8.9 - 10.3 mg/dL 9.0  Anion gap 5 - 15  10  Alkaline Phosphatase 38 - 126 U/L 77  Albumin 3.5 - 5.0 g/dL 4.1  Lipase 11 - 51 U/L 23  AST 15 - 41 U/L 24  ALT 0 - 44 U/L 18  Total Protein 6.5 - 8.1 g/dL 7.7  Total Bilirubin 0.3 - 1.2 mg/dL 0.6  GFR, Estimated >39 mL/min >60  (H): Data is abnormally high   Old records , labs and images have been reviewed.     ASSESSMENT / PLAN / RECOMMENDATIONS:   1) Type 2 Diabetes Mellitus, Optimally controlled, Without  complications - Most recent A1c of 6.6 %. Goal  A1c < 7.0 %.    -A1c at goal -Will decrease Tresiba  due to tight/hypoglycemia overnight -She has been using NovoLog  per correction scale but tends to forget to use it earlier in the meal and would end up using it halfway through the meal  MEDICATIONS: Continue Metformin  500 mg, 4 tablets daily Continue Jardiance  25 mg daily Decrease Tresiba  12 units daily Correction factor:Novolog  (Bg-130/40) TIDQAC  EDUCATION / INSTRUCTIONS: BG monitoring instructions: Patient is instructed to check her blood sugars 3 times a day. Call [facility] if: BG persistently < 70  I reviewed the Rule of 15 for the treatment of hypoglycemia in detail with the patient. Literature supplied.    2) Diabetic complications:  Eye: Does not have known diabetic retinopathy.  Neuro/ Feet: Does not have known diabetic peripheral neuropathy .  Renal: Patient does not have known baseline CKD. She   is not on an ACEI/ARB at present.    3) Dyslipidemia:  -LDL above goal -Patient intolerant to multiple statins due to joint pains - Encouraged  low fat diet  -May consider Zetia or PCSK9 inhibitors if needed   4) Weight gain :  -I have recommended either Noom or weight watchers, as she will need to focus on decreasing caloric intake to help with weight loss -Continue exercise  F/U in 6 months     Signed electronically by: Stefano Redgie Butts, MD  [facility]  [facility] [address] [place], [place] [address] Phone: [phone] FAX: [phone]   CC: Berneta Elsie Sayre, MD [address] [place] [address] Phone: [phone]  Fax: [phone]  Return to Endocrinology clinic as below: No future appointments.

## 2023-03-14 ENCOUNTER — Ambulatory Visit (INDEPENDENT_AMBULATORY_CARE_PROVIDER_SITE_OTHER): Payer: PRIVATE HEALTH INSURANCE | Admitting: Internal Medicine

## 2023-03-14 ENCOUNTER — Encounter: Payer: Self-pay | Admitting: Internal Medicine

## 2023-03-14 VITALS — BP 112/70 | HR 80 | Ht <= 58 in | Wt 136.0 lb

## 2023-03-14 DIAGNOSIS — Z789 Other specified health status: Secondary | ICD-10-CM

## 2023-03-14 DIAGNOSIS — Z794 Long term (current) use of insulin: Secondary | ICD-10-CM | POA: Diagnosis not present

## 2023-03-14 DIAGNOSIS — E785 Hyperlipidemia, unspecified: Secondary | ICD-10-CM | POA: Diagnosis not present

## 2023-03-14 DIAGNOSIS — E119 Type 2 diabetes mellitus without complications: Secondary | ICD-10-CM | POA: Insufficient documentation

## 2023-03-14 LAB — POCT GLYCOSYLATED HEMOGLOBIN (HGB A1C): Hemoglobin A1C: 6.6 % — AB (ref 4.0–5.6)

## 2023-03-14 MED ORDER — METFORMIN HCL ER 500 MG PO TB24
2000.0000 mg | ORAL_TABLET | Freq: Every day | ORAL | 3 refills | Status: DC
Start: 2023-03-14 — End: 2023-09-20

## 2023-03-14 MED ORDER — FREESTYLE LIBRE 3 SENSOR MISC
1.0000 | 3 refills | Status: DC
Start: 2023-03-14 — End: 2023-09-20

## 2023-03-14 MED ORDER — EMPAGLIFLOZIN 25 MG PO TABS
25.0000 mg | ORAL_TABLET | Freq: Every day | ORAL | 3 refills | Status: DC
Start: 2023-03-14 — End: 2023-09-20

## 2023-03-14 MED ORDER — TRESIBA FLEXTOUCH 100 UNIT/ML ~~LOC~~ SOPN
12.0000 [IU] | PEN_INJECTOR | Freq: Every day | SUBCUTANEOUS | 3 refills | Status: DC
Start: 2023-03-14 — End: 2023-09-20

## 2023-03-14 MED ORDER — NOVOLOG FLEXPEN 100 UNIT/ML ~~LOC~~ SOPN: PEN_INJECTOR | SUBCUTANEOUS | 3 refills | Status: DC

## 2023-03-14 NOTE — Patient Instructions (Signed)
 Continue metformin  500 mg, 4 tablets daily Continue Jardiance  25 mg daily Decrease  Tresiba  12 units daily Novolog  correctional insulin : Use the scale below to help guide you before each meal   Blood sugar before meal Number of units to inject  Less than 170 0 unit  171 -  210 1 units  211 -  250 2 units  251 -  290 3 units  291 -  330 4 units  331 -  370 5 units  371 -  410 6 units   HOW TO TREAT LOW BLOOD SUGARS (Blood sugar LESS THAN 70 MG/DL) Please follow the RULE OF 15 for the treatment of hypoglycemia treatment (when your (blood sugars are less than 70 mg/dL)   STEP 1: Take 15 grams of carbohydrates when your blood sugar is low, which includes:  3-4 GLUCOSE TABS  OR 3-4 OZ OF JUICE OR REGULAR SODA OR ONE TUBE OF GLUCOSE GEL    STEP 2: RECHECK blood sugar in 15 MINUTES STEP 3: If your blood sugar is still low at the 15 minute recheck --> then, go back to STEP 1 and treat AGAIN with another 15 grams of carbohydrates.

## 2023-04-12 LAB — HM DIABETES EYE EXAM

## 2023-04-29 ENCOUNTER — Encounter: Payer: Self-pay | Admitting: Internal Medicine

## 2023-07-20 ENCOUNTER — Encounter: Payer: Self-pay | Admitting: Internal Medicine

## 2023-07-20 ENCOUNTER — Ambulatory Visit (INDEPENDENT_AMBULATORY_CARE_PROVIDER_SITE_OTHER): Payer: PRIVATE HEALTH INSURANCE | Admitting: Internal Medicine

## 2023-07-20 VITALS — BP 110/70 | HR 94 | Temp 98.4°F | Ht <= 58 in | Wt 134.0 lb

## 2023-07-20 DIAGNOSIS — J069 Acute upper respiratory infection, unspecified: Secondary | ICD-10-CM | POA: Diagnosis not present

## 2023-07-20 MED ORDER — FLUCONAZOLE 150 MG PO TABS
ORAL_TABLET | ORAL | 0 refills | Status: AC
Start: 2023-07-20 — End: ?

## 2023-07-20 MED ORDER — AMOXICILLIN-POT CLAVULANATE 875-125 MG PO TABS
1.0000 | ORAL_TABLET | Freq: Two times a day (BID) | ORAL | 0 refills | Status: AC
Start: 2023-07-20 — End: 2023-07-27

## 2023-07-20 NOTE — Patient Instructions (Addendum)
 Rest, drink plenty of fluids.  Tylenol  for fever, body aches.   For cough: Take Mucinex  DM or Robitussin-DM over-the-counter.  Follow the instructions in the box.  For nasal and sinus congestion:  Use over-the-counter Astepro 2 nasal sprays on each side of the nose twice daily until better  Patients without high blood pressure: can use decongestants such as pseudoephedrine or phenylephrine no more than 3-5 days.   Patients with high blood pressure can use Coricidin HBP for decongestant, it will not raise your blood pressure.   If you have been prescribed an antibiotic, take as prescribed.  Call if not gradually better over the next  10 days  Call anytime if the symptoms are severe, you have high fever, short of breath, chest pain

## 2023-07-20 NOTE — Progress Notes (Signed)
 [facility] [address] [place] [address] Dept: [phone] Dept Fax: [phone]  Acute Care Office Visit  Subjective:   Anna Jennings [birth year] 07/20/2023  Chief Complaint  Patient presents with   URI   Fever    Started on Monday     HPI:  Discussed the use of AI scribe software for clinical note transcription with the patient, who gave verbal consent to proceed.  History of Present Illness   Anna Jennings "[name]" is a 53 year old female who presents with symptoms of an upper respiratory infection.  Symptoms began on Monday night with postnasal drip, cough, and a sensation of air hitting the back of her throat when she breathes. The nasal discharge is green-yellow. She also experiences sinus pressure and a 'funny' feeling, though this episode is less intense than her past severe sinus issues. She took a COVID test yesterday, which was negative.  She mentions having a low-grade fever, noting her shirt was wet when she woke up last night, which she attributes to a possible fever, however she is going through menopause too.   For her symptoms, she has been taking ibuprofen. She has a history of sinus infections and is allergic to sulfa drugs. She has previously taken amoxicillin without issues.  She recently traveled from [place].        The following portions of the patient's history were reviewed and updated as appropriate: past medical history, past surgical history, family history, social history, allergies, medications, and problem list.   Patient Active Problem List   Diagnosis Date Noted   Type 2 diabetes mellitus without complication, with long-term current use of insulin  (HCC) 03/14/2023   Dyslipidemia 03/14/2023   Statin intolerance 10/11/2022   Diabetes mellitus type 2 in nonobese (HCC) 10/11/2022   Strain of right shoulder 12/12/2017   Body aches 03/22/2017   Fever 03/22/2017   Upper  respiratory tract infection 03/22/2017   Need for influenza vaccination 01/23/2017   Need for 23-polyvalent pneumococcal polysaccharide vaccine 01/23/2017   Diabetes mellitus without complication (HCC) 01/23/2017   Fatigue 01/23/2017   Physical exam 01/23/2017   Vitamin B12 deficiency 01/19/2016   Mixed hyperlipidemia 05/08/2013   Islet cell adenocarcinoma (HCC) 02/12/2013   Hypertriglyceridemia 02/12/2013   Vitamin D  deficiency 02/12/2013   Type II or unspecified type diabetes mellitus without mention of complication, uncontrolled 02/11/2013   Past Medical History:  Diagnosis Date   Diabetes mellitus without complication (HCC)    Past Surgical History:  Procedure Laterality Date   APPENDECTOMY     CHOLECYSTECTOMY     PANCREAS SURGERY     Tumor head of pancreas, incompletely resected   Family History  Problem Relation Age of Onset   Hyperlipidemia Father    Hypertension Father    Coronary artery disease Father    Hearing loss Father    Diabetes Brother    Heart disease Paternal Grandfather     Current Outpatient Medications:    amoxicillin-clavulanate (AUGMENTIN) 875-125 MG tablet, Take 1 tablet by mouth 2 (two) times daily for 7 days., Disp: 14 tablet, Rfl: 0   Blood Glucose Monitoring Suppl (ONETOUCH VERIO FLEX SYSTEM) w/Device KIT, USE AS INSTRUCTED TO CALIBRATE DEXCOM SYSTEM UP TO THREE TIMES A DAY, Disp: 1 kit, Rfl: 0   Calcium -Magnesium-Vitamin D  185-50-100 MG-MG-UNIT CAPS, Take by mouth., Disp: , Rfl:    cetirizine (ZYRTEC) 10 MG tablet, Take 20 mg by mouth daily. , Disp: , Rfl:  Cholecalciferol (VITAMIN D ) 50 MCG (2000 UT) tablet, Take 2,000 Units by mouth daily., Disp: , Rfl:    Continuous Glucose Sensor (FREESTYLE LIBRE 3 SENSOR) MISC, 1 Device by Other route every 14 (fourteen) days. APPLY ONE SENSOR ON UPPER ARM EVERY 14 DAYS FOR CONTINUOUS GLUCOSE MONITORING, Disp: 6 each, Rfl: 3   diphenhydrAMINE (BENADRYL) 25 MG tablet, Take 25 mg by mouth at bedtime as  needed., Disp: , Rfl:    empagliflozin  (JARDIANCE ) 25 MG TABS tablet, Take 1 tablet (25 mg total) by mouth daily before breakfast., Disp: 90 tablet, Rfl: 3   estradiol -levonorgestrel (CLIMARA  PRO) 0.045-0.015 MG/DAY, Place 1 patch onto the skin once a week., Disp: , Rfl:    fluconazole  (DIFLUCAN ) 150 MG tablet, Take 1 tablet by mouth once. Repeat dose in 3 days if symptoms persist., Disp: 2 tablet, Rfl: 0   fluvastatin  (LESCOL ) 20 MG capsule, Take 1 capsule (20 mg total) by mouth at bedtime., Disp: 30 capsule, Rfl: 4   glucose blood test strip, Use Onetouch verio test strips as instructed to check blood sugar three times daily., Disp: 100 each, Rfl: 2   insulin  aspart (NOVOLOG  FLEXPEN) 100 UNIT/ML FlexPen, Max daily 30 units, Disp: 30 mL, Rfl: 3   insulin  degludec (TRESIBA  FLEXTOUCH) 100 UNIT/ML FlexTouch Pen, Inject 12 Units into the skin daily. Adjust as directed, Disp: 15 mL, Rfl: 3   Insulin  Pen Needle (UNIFINE PENTIPS PLUS) 32G X 4 MM MISC, 1 Device by Other route in the morning, at noon, in the evening, and at bedtime. USE ONE PEN NEEDLE TO INJECT INSULIN  FOUR TIMES DAILY, Disp: 400 each, Rfl: 3   levocetirizine (XYZAL) 5 MG tablet, Take 5 mg by mouth every evening., Disp: , Rfl:    metFORMIN  (GLUCOPHAGE -XR) 500 MG 24 hr tablet, Take 4 tablets (2,000 mg total) by mouth daily with breakfast., Disp: 360 tablet, Rfl: 3   OneTouch Delica Lancets 30G MISC, 1 each by Does not apply route 3 (three) times daily. Use onetouch delica lancets to check blood sugar three times daily., Disp: 100 each, Rfl: 2   Thiamine HCl (VITAMIN B-1) 250 MG tablet, Take 250 mg by mouth daily., Disp: , Rfl:    Turmeric 500 MG CAPS, Take by mouth., Disp: , Rfl:    vitamin C (ASCORBIC ACID) 500 MG tablet, Take 500 mg by mouth daily., Disp: , Rfl:  Allergies  Allergen Reactions   Sulfa Antibiotics Nausea Only    Still able to take but makes nausea     ROS: A complete ROS was performed with pertinent positives/negatives  noted in the HPI. The remainder of the ROS are negative.    Objective:   Today's Vitals   07/20/23 1313  BP: 110/70  Pulse: 94  Temp: 98.4 F (36.9 C)  TempSrc: Temporal  SpO2: 98%  Weight: 134 lb (60.8 kg)  Height: 4\' 10"  (1.473 m)    GENERAL: Well-appearing, in NAD. Well nourished.  SKIN: Pink, warm and dry. No rash.  HEENT:    HEAD: Normocephalic, non-traumatic.  EYES: Conjunctive pink without exudate.  EARS: External ear w/o redness, swelling, masses, or lesions. EAC clear. TM's intact, hazy w/o bulging, appropriate landmarks visualized.  NOSE: Septum midline w/o deformity. Nares patent, mucosa pink and inflamed with drainage. No sinus tenderness.  THROAT: Uvula midline. Oropharynx with white PND. Tonsils non-inflamed w/o exudate . Mucus membranes pink and moist.  NECK: Trachea midline. Full ROM w/o pain or tenderness. No lymphadenopathy.  RESPIRATORY: Chest wall symmetrical. Respirations even  and non-labored. Breath sounds clear to auscultation bilaterally.  CARDIAC: S1, S2 present, regular rate and rhythm. Peripheral pulses 2+ bilaterally.  EXTREMITIES: Without clubbing, cyanosis, or edema.  NEUROLOGIC: Steady, even gait.  PSYCH/MENTAL STATUS: Alert, oriented x 3. Cooperative, appropriate mood and affect.    No results found for any visits on 07/20/23.    Assessment & Plan:  Assessment and Plan    Upper respiratory infection - Ibuprofen for fever and pain. - Astepro nasal spray for postnasal drip. - Salt water gargles for sore throat. - Sugar-free Robitussin DM or Delsym for cough. - Augmentin advised to start if symptoms worsen, one tablet twice daily for seven days with food. - Advise rest and hydration.       Meds ordered this encounter  Medications   amoxicillin-clavulanate (AUGMENTIN) 875-125 MG tablet    Sig: Take 1 tablet by mouth 2 (two) times daily for 7 days.    Dispense:  14 tablet    Refill:  0    Supervising Provider:   THOMPSON, AARON B  [[id number]]   fluconazole  (DIFLUCAN ) 150 MG tablet    Sig: Take 1 tablet by mouth once. Repeat dose in 3 days if symptoms persist.    Dispense:  2 tablet    Refill:  0    Supervising Provider:   THOMPSON, AARON B [[id number]]   No orders of the defined types were placed in this encounter.  Lab Orders  No laboratory test(s) ordered today   No images are attached to the encounter or orders placed in the encounter.  Return if symptoms worsen or fail to improve.   Gavin Kast, FNP

## 2023-08-11 ENCOUNTER — Other Ambulatory Visit: Payer: Self-pay

## 2023-08-11 MED ORDER — FREESTYLE LIBRE 3 PLUS SENSOR MISC: 3 refills | Status: AC

## 2023-09-07 ENCOUNTER — Telehealth: Payer: PRIVATE HEALTH INSURANCE | Admitting: Physician Assistant

## 2023-09-07 DIAGNOSIS — H00015 Hordeolum externum left lower eyelid: Secondary | ICD-10-CM

## 2023-09-07 MED ORDER — NEOMYCIN-POLYMYXIN-DEXAMETH 3.5-10000-0.1 OP SUSP
2.0000 [drp] | Freq: Four times a day (QID) | OPHTHALMIC | 0 refills | Status: AC
Start: 2023-09-07 — End: 2023-09-12

## 2023-09-07 NOTE — Progress Notes (Signed)
  E-Visit for Stye   We are sorry that you are not feeling well. Here is how we plan to help!  Based on what you have shared with me it looks like you have a stye.  A stye is an inflammation of the eyelid.  It is often a red, painful lump near the edge of the eyelid that may look like a boil or a pimple.  A stye develops when an infection occurs at the base of an eyelash.   We have made appropriate suggestions for you based upon your presentation: Simple styes can be treated without medical intervention.  Most styes either resolve spontaneously or resolve with simple home treatment by applying warm compresses or heated washcloth to the stye for about 10-15 minutes three to four times a day. This causes the stye to drain and resolve. and I have prescribed Maxitrol 3.06-998-1 Use 1-2 drops in affected eye every 6 hours while awake for 5 days. To Apply: Tilt your head back, look upward, and pull down the lower eyelid to make a pouch. Hold the dropper directly over your eye and place one drop into the pouch. Look downward and gently close your eyes for 1 to 2 minutes. Place one finger at the corner of your eye (near the nose) and apply gentle pressure. This will prevent the medication from draining out. Try not to blink and do not rub your eye. Repeat these steps if your dose is for more than 1 drop and for your other eye if so directed. The dosage is based on your medical condition and response to treatment.  HOME CARE:  Wash your hands often! Let the stye open on its own. Don't squeeze or open it. Don't rub your eyes. This can irritate your eyes and let in bacteria.  If you need to touch your eyes, wash your hands first. Don't wear eye makeup or contact lenses until the area has healed.  GET HELP RIGHT AWAY IF:  Your symptoms do not improve. You develop blurred or loss of vision. Your symptoms worsen (increased discharge, pain or redness).   Thank you for choosing an e-visit.  Your e-visit  answers were reviewed by a board certified advanced clinical practitioner to complete your personal care plan. Depending upon the condition, your plan could have included both over the counter or prescription medications.  Please review your pharmacy choice. Make sure the pharmacy is open so you can pick up prescription now. If there is a problem, you may contact your provider through [employer] and have the prescription routed to another pharmacy.  Your safety is important to us . If you have drug allergies check your prescription carefully.   For the next 24 hours you can use MyChart to ask questions about today's visit, request a non-urgent call back, or ask for a work or school excuse. You will get an email in the next two days asking about your experience. I hope that your e-visit has been valuable and will speed your recovery.

## 2023-09-07 NOTE — Progress Notes (Signed)
 I have spent 5 minutes in review of e-visit questionnaire, review and updating patient chart, medical decision making and response to patient.   Piedad Climes, PA-C

## 2023-09-20 ENCOUNTER — Encounter: Payer: Self-pay | Admitting: Internal Medicine

## 2023-09-20 ENCOUNTER — Ambulatory Visit (INDEPENDENT_AMBULATORY_CARE_PROVIDER_SITE_OTHER): Payer: PRIVATE HEALTH INSURANCE | Admitting: Internal Medicine

## 2023-09-20 VITALS — BP 116/72 | HR 84 | Ht <= 58 in | Wt 133.0 lb

## 2023-09-20 DIAGNOSIS — E785 Hyperlipidemia, unspecified: Secondary | ICD-10-CM | POA: Diagnosis not present

## 2023-09-20 DIAGNOSIS — E538 Deficiency of other specified B group vitamins: Secondary | ICD-10-CM | POA: Diagnosis not present

## 2023-09-20 DIAGNOSIS — Z794 Long term (current) use of insulin: Secondary | ICD-10-CM

## 2023-09-20 DIAGNOSIS — E559 Vitamin D deficiency, unspecified: Secondary | ICD-10-CM | POA: Diagnosis not present

## 2023-09-20 DIAGNOSIS — E119 Type 2 diabetes mellitus without complications: Secondary | ICD-10-CM

## 2023-09-20 LAB — POCT GLYCOSYLATED HEMOGLOBIN (HGB A1C): Hemoglobin A1C: 6.6 % — AB (ref 4.0–5.6)

## 2023-09-20 MED ORDER — METFORMIN HCL ER 500 MG PO TB24
2000.0000 mg | ORAL_TABLET | Freq: Every day | ORAL | 3 refills | Status: AC
Start: 2023-09-20 — End: ?

## 2023-09-20 MED ORDER — UNIFINE PENTIPS PLUS 32G X 4 MM MISC: 1.0000 | Freq: Four times a day (QID) | 3 refills | Status: AC

## 2023-09-20 MED ORDER — EMPAGLIFLOZIN 25 MG PO TABS: 25.0000 mg | ORAL_TABLET | Freq: Every day | ORAL | 3 refills | Status: AC

## 2023-09-20 MED ORDER — TRESIBA FLEXTOUCH 100 UNIT/ML ~~LOC~~ SOPN
12.0000 [IU] | PEN_INJECTOR | Freq: Every day | SUBCUTANEOUS | 3 refills | Status: AC
Start: 2023-09-20 — End: ?

## 2023-09-20 MED ORDER — NOVOLOG FLEXPEN 100 UNIT/ML ~~LOC~~ SOPN: PEN_INJECTOR | SUBCUTANEOUS | 3 refills | Status: AC

## 2023-09-20 NOTE — Patient Instructions (Addendum)
 Continue metformin  500 mg, 4 tablets daily Continue Jardiance  25 mg daily Continue Tresiba  12 units daily Novolog  correctional insulin : Use the scale below to help guide you before each meal   Blood sugar before meal Number of units to inject  Less than 175 0 unit  176 - 220 1 units  221 - 265 2 units  266 - 310 3 units  311 - 355 4 units  356 - 400 5 units     HOW TO TREAT LOW BLOOD SUGARS (Blood sugar LESS THAN 70 MG/DL) Please follow the RULE OF 15 for the treatment of hypoglycemia treatment (when your (blood sugars are less than 70 mg/dL)   STEP 1: Take 15 grams of carbohydrates when your blood sugar is low, which includes:  3-4 GLUCOSE TABS  OR 3-4 OZ OF JUICE OR REGULAR SODA OR ONE TUBE OF GLUCOSE GEL    STEP 2: RECHECK blood sugar in 15 MINUTES STEP 3: If your blood sugar is still low at the 15 minute recheck --> then, go back to STEP 1 and treat AGAIN with another 15 grams of carbohydrates.

## 2023-09-20 NOTE — Progress Notes (Unsigned)
 Name: Anna Jennings  Age/ Sex: 53 y.o., female   MRN/ DOB: [id number], [birth year]     PCP: Berneta Elsie Sayre, MD   Reason for Endocrinology Evaluation: Type 2 Diabetes Mellitus  Initial Endocrine Consultative Visit: 02/11/2013    PATIENT IDENTIFIER: Anna Jennings is a 53 y.o. 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 The patient has followed with Endocrinology clinic since 02/11/2013 for consultative assistance with management of her diabetes.  DIABETIC HISTORY:  Ms. Meenan was diagnosed with DM  2007.  Metformin  started in 2009, was unable to tolerate regular release, but tolerating XR. Her hemoglobin A1c has ranged from 5.6% in 2020, peaking at 7.1% in 2022.   Patient was followed by Dr. Von from 2014 until 03/2022   She was diagnosed with pancreatic adenocarcinoma in 1999, she was treated with radiation therapy, she required extensive surgeries with complication in 2018.   Was evaluated by oncology 10/2022 and no evidence of malignancy   She is on [facility]  to GYN  SUBJECTIVE:   During the last visit (03/14/2023): A1c 6.6%     Today (09/20/2023): Anna Jennings is here for follow-up on diabetes management.  She checks her blood sugars multiple times daily through [employer]. The patient has  had hypoglycemic episodes since the last clinic visit. The patient is  symptomatic with these episodes.  She had a follow-up with hematology/oncology in February, 2025 and there has been no changes to pancreatic imaging which was deemed difficult to assess due to surgical changes and calcifications and no further surveillance was recommended  Denies nausea or vomiting  Denies abdominal pain  Denies constipation or diarrhea    HOME DIABETES REGIMEN:  Metformin  500 mg XR, 4 tabs daily Jardiance  25 mg daily Tresiba  12 units daily Correction factor:Novolog  (Bg-130/40)  TIDQAC     Statin: no ACE-I/ARB: no  CONTINUOUS GLUCOSE MONITORING RECORD INTERPRETATION    Dates of Recording: 6/26-7/23/2025  Sensor description: Freestyle libre3*  Results statistics:   CGM use % of time 58  Average and SD 143/29.4  Time in range  83%  % Time Above 180 14  % Time above 250 2  % Time Below target 1   Glycemic patterns summary: BGs are optimal most day and night  Hyperglycemic episodes postprandial  Hypoglycemic episodes occurred overnight  Overnight periods: Optimal    DIABETIC COMPLICATIONS: Microvascular complications:   Denies: CKD Last Eye Exam: Completed 2023  Macrovascular complications:   Denies: CAD, CVA, PVD   HISTORY:  Past Medical History:  Past Medical History:  Diagnosis Date   Diabetes mellitus without complication (HCC)    Past Surgical History:  Past Surgical History:  Procedure Laterality Date   APPENDECTOMY     CHOLECYSTECTOMY     PANCREAS SURGERY     Tumor head of pancreas, incompletely resected   Social History:  reports that she has never smoked. She has never used smokeless tobacco. She reports current alcohol use. She reports that she does not use drugs. Family History:  Family History  Problem Relation Age of Onset   Hyperlipidemia Father    Hypertension Father    Coronary artery disease Father    Hearing loss Father    Diabetes Brother    Heart disease Paternal Grandfather      HOME MEDICATIONS: Allergies as of 09/20/2023       Reactions   Sulfa Antibiotics Nausea Only  Still able to take but makes nausea        Medication List        Accurate as of September 20, 2023 10:45 AM. If you have any questions, ask your nurse or doctor.          ascorbic acid 500 MG tablet Commonly known as: VITAMIN C Take 500 mg by mouth daily.   Calcium -Magnesium-Vitamin D  185-50-100 MG-MG-UNIT Caps Take by mouth.   cetirizine 10 MG tablet Commonly known as: ZYRTEC Take 20 mg by mouth daily.    [place]  Pro 0.045-0.015 MG/DAY Generic drug: estradiol -levonorgestrel Place 1 patch onto the skin once a week.   diphenhydrAMINE 25 MG tablet Commonly known as: BENADRYL Take 25 mg by mouth at bedtime as needed.   empagliflozin  25 MG Tabs tablet Commonly known as: Jardiance  Take 1 tablet (25 mg total) by mouth daily before breakfast.   fluconazole  150 MG tablet Commonly known as: Diflucan  Take 1 tablet by mouth once. Repeat dose in 3 days if symptoms persist.   fluvastatin  20 MG capsule Commonly known as: LESCOL  Take 1 capsule (20 mg total) by mouth at bedtime.   FreeStyle Libre 3 Sensor Misc 1 Device by Other route every 14 (fourteen) days. APPLY ONE SENSOR ON UPPER ARM EVERY 14 DAYS FOR CONTINUOUS GLUCOSE MONITORING   FreeStyle Libre 3 Plus Sensor Misc Change sensor every 15 days.   glucose blood test strip Use Onetouch verio test strips as instructed to check blood sugar three times daily.   levocetirizine 5 MG tablet Commonly known as: XYZAL Take 5 mg by mouth every evening.   metFORMIN  500 MG 24 hr tablet Commonly known as: GLUCOPHAGE -XR Take 4 tablets (2,000 mg total) by mouth daily with breakfast.   NovoLOG  FlexPen 100 UNIT/ML FlexPen Generic drug: insulin  aspart Max daily 30 units   OneTouch Delica Lancets 30G Misc 1 each by Does not apply route 3 (three) times daily. Use onetouch delica lancets to check blood sugar three times daily.   OneTouch Verio Flex System w/Device Kit USE AS INSTRUCTED TO CALIBRATE DEXCOM SYSTEM UP TO THREE TIMES A DAY   Tresiba  FlexTouch 100 UNIT/ML FlexTouch Pen Generic drug: insulin  degludec Inject 12 Units into the skin daily. Adjust as directed   Turmeric 500 MG Caps Take by mouth.   Unifine Pentips Plus 32G X 4 MM Misc Generic drug: Insulin  Pen Needle 1 Device by Other route in the morning, at noon, in the evening, and at bedtime. USE ONE PEN NEEDLE TO INJECT INSULIN  FOUR TIMES DAILY   vitamin B-1 250 MG tablet Take  250 mg by mouth daily.   Vitamin D  50 MCG (2000 UT) tablet Take 2,000 Units by mouth daily.         OBJECTIVE:   Vital Signs: BP 116/72 (BP Location: Left Arm, Patient Position: Sitting, Cuff Size: Normal)   Pulse 84   Ht 4' 10 (1.473 m)   Wt 133 lb (60.3 kg)   LMP 01/20/2017   SpO2 99%   BMI 27.80 kg/m   Wt Readings from Last 3 Encounters:  09/20/23 133 lb (60.3 kg)  07/20/23 134 lb (60.8 kg)  03/14/23 136 lb (61.7 kg)     Exam: General: Pt appears well and is in NAD  Lungs: Clear with good BS bilat   Heart: RRR   Abdomen:  soft, nontender  Extremities: No pretibial edema.   Neuro: MS is good with appropriate affect, pt is alert and Ox3    DM foot  exam: 09/20/2023    The skin of the feet is intact without sores or ulcerations. The pedal pulses are 2+ on right and 2+ on left. The sensation is intact to a screening 5.07, 10 gram monofilament bilaterally     DATA REVIEWED:  Lab Results  Component Value Date   HGBA1C 6.6 (A) 03/14/2023   HGBA1C 6.7 (A) 10/11/2022   HGBA1C 6.3 08/15/2022    Latest Reference Range & Units 10/28/22 00:04  Sodium 135 - 145 mmol/L 136  Potassium 3.5 - 5.1 mmol/L 3.5  Chloride 98 - 111 mmol/L 100  CO2 22 - 32 mmol/L 26  Glucose 70 - 99 mg/dL 872 (H)  BUN 6 - 20 mg/dL 18  Creatinine 9.55 - 8.99 mg/dL 9.37  Calcium  8.9 - 10.3 mg/dL 9.0  Anion gap 5 - 15  10  Alkaline Phosphatase 38 - 126 U/L 77  Albumin 3.5 - 5.0 g/dL 4.1  Lipase 11 - 51 U/L 23  AST 15 - 41 U/L 24  ALT 0 - 44 U/L 18  Total Protein 6.5 - 8.1 g/dL 7.7  Total Bilirubin 0.3 - 1.2 mg/dL 0.6  GFR, Estimated >39 mL/min >60  (H): Data is abnormally high   Old records , labs and images have been reviewed.     ASSESSMENT / PLAN / RECOMMENDATIONS:   1) Type 2 Diabetes Mellitus, Optimally controlled, Without  complications - Most recent A1c of 6.6 %. Goal A1c < 7.0 %.    -A1c at goal -Will decrease Tresiba  due to tight/hypoglycemia overnight -She has been  using NovoLog  per correction scale but tends to forget to use it earlier in the meal and would end up using it halfway through the meal  MEDICATIONS: Continue Metformin  500 mg, 4 tablets daily Continue Jardiance  25 mg daily Decrease Tresiba  12 units daily Correction factor:Novolog  (Bg-130/40) TIDQAC  EDUCATION / INSTRUCTIONS: BG monitoring instructions: Patient is instructed to check her blood sugars 3 times a day. Call [facility] if: BG persistently < 70  I reviewed the Rule of 15 for the treatment of hypoglycemia in detail with the patient. Literature supplied.    2) Diabetic complications:  Eye: Does not have known diabetic retinopathy.  Neuro/ Feet: Does not have known diabetic peripheral neuropathy .  Renal: Patient does not have known baseline CKD. She   is not on an ACEI/ARB at present.    3) Dyslipidemia:  -LDL above goal -Patient intolerant to multiple statins due to joint pains - Encouraged  low fat diet  -May consider Zetia or PCSK9 inhibitors if needed    F/U in 6 months     Signed electronically by: Stefano Redgie Butts, MD  [facility]  [facility] [address] [place] [place], [place] [address] Phone: [phone] FAX: [phone]   CC: Berneta Elsie Sayre, MD [address] [place] [address] Phone: [phone]  Fax: [phone]  Return to Endocrinology clinic as below: No future appointments.

## 2023-09-21 ENCOUNTER — Ambulatory Visit: Payer: Self-pay | Admitting: Internal Medicine

## 2023-09-21 ENCOUNTER — Encounter: Payer: Self-pay | Admitting: Internal Medicine

## 2023-09-21 LAB — BASIC METABOLIC PANEL WITH GFR
BUN: 20 mg/dL (ref 7–25)
CO2: 31 mmol/L (ref 20–32)
Calcium: 9 mg/dL (ref 8.6–10.4)
Chloride: 101 mmol/L (ref 98–110)
Creat: 0.57 mg/dL (ref 0.50–1.03)
Glucose, Bld: 127 mg/dL — ABNORMAL HIGH (ref 65–99)
Potassium: 4.2 mmol/L (ref 3.5–5.3)
Sodium: 138 mmol/L (ref 135–146)
eGFR: 110 mL/min/1.73m2 (ref >=60)

## 2023-09-21 LAB — LIPID PANEL
Cholesterol: 211 mg/dL — ABNORMAL HIGH (ref <200)
HDL: 62 mg/dL (ref >=50)
LDL Cholesterol (Calc): 130 mg/dL — ABNORMAL HIGH
Non-HDL Cholesterol (Calc): 149 mg/dL — ABNORMAL HIGH (ref <130)
Total CHOL/HDL Ratio: 3.4 (calc) (ref <5.0)
Triglycerides: 91 mg/dL (ref <150)

## 2023-09-21 LAB — VITAMIN D 25 HYDROXY (VIT D DEFICIENCY, FRACTURES): Vit D, 25-Hydroxy: 69 ng/mL (ref 30–100)

## 2023-09-21 LAB — VITAMIN B12: Vitamin B-12: 1079 pg/mL (ref 200–1100)

## 2023-09-21 LAB — MICROALBUMIN / CREATININE URINE RATIO
Creatinine, Urine: 71 mg/dL (ref 20–275)
Microalb Creat Ratio: 6 mg/g{creat} (ref <30)
Microalb, Ur: 0.4 mg/dL

## 2024-03-20 ENCOUNTER — Ambulatory Visit: Payer: PRIVATE HEALTH INSURANCE | Admitting: Internal Medicine

## 2024-05-15 ENCOUNTER — Ambulatory Visit: Payer: PRIVATE HEALTH INSURANCE | Admitting: Internal Medicine
